# Patient Record
Sex: Female | Born: 1945 | Hispanic: No | Marital: Married | Smoking: Never smoker
Health system: Southern US, Community
[De-identification: ages and names within clinical notes are randomized; demographics above are authoritative.]

## PROBLEM LIST (undated history)

## (undated) DIAGNOSIS — J45909 Unspecified asthma, uncomplicated: Secondary | ICD-10-CM

## (undated) DIAGNOSIS — D849 Immunodeficiency, unspecified: Secondary | ICD-10-CM

## (undated) DIAGNOSIS — J189 Pneumonia, unspecified organism: Secondary | ICD-10-CM

## (undated) DIAGNOSIS — J479 Bronchiectasis, uncomplicated: Secondary | ICD-10-CM

## (undated) HISTORY — DX: Pneumonia, unspecified organism: J18.9

## (undated) HISTORY — DX: Unspecified asthma, uncomplicated: J45.909

## (undated) HISTORY — DX: Immunodeficiency, unspecified: D84.9

## (undated) HISTORY — DX: Bronchiectasis, uncomplicated: J47.9

## (undated) HISTORY — PX: TOTAL HIP ARTHROPLASTY: SHX124

---

## 1998-06-13 ENCOUNTER — Other Ambulatory Visit: Admission: RE | Admit: 1998-06-13 | Discharge: 1998-06-13 | Payer: Self-pay | Admitting: *Deleted

## 1999-12-20 ENCOUNTER — Other Ambulatory Visit: Admission: RE | Admit: 1999-12-20 | Discharge: 1999-12-20 | Payer: Self-pay | Admitting: Obstetrics and Gynecology

## 2000-04-21 ENCOUNTER — Other Ambulatory Visit: Admission: RE | Admit: 2000-04-21 | Discharge: 2000-04-21 | Payer: Self-pay | Admitting: Obstetrics and Gynecology

## 2011-12-12 ENCOUNTER — Other Ambulatory Visit: Payer: Self-pay | Admitting: Gastroenterology

## 2011-12-12 DIAGNOSIS — R059 Cough, unspecified: Secondary | ICD-10-CM

## 2011-12-12 DIAGNOSIS — R05 Cough: Secondary | ICD-10-CM

## 2011-12-31 ENCOUNTER — Ambulatory Visit
Admission: RE | Admit: 2011-12-31 | Discharge: 2011-12-31 | Disposition: A | Discharge status: Home / Self Care | Payer: Medicare Other | Source: Ambulatory Visit | Attending: Gastroenterology | Admitting: Gastroenterology

## 2011-12-31 DIAGNOSIS — R05 Cough: Secondary | ICD-10-CM

## 2011-12-31 DIAGNOSIS — R059 Cough, unspecified: Secondary | ICD-10-CM

## 2013-11-05 DIAGNOSIS — J309 Allergic rhinitis, unspecified: Secondary | ICD-10-CM | POA: Insufficient documentation

## 2013-11-05 DIAGNOSIS — K2100 Gastro-esophageal reflux disease with esophagitis, without bleeding: Secondary | ICD-10-CM | POA: Insufficient documentation

## 2013-11-05 DIAGNOSIS — J302 Other seasonal allergic rhinitis: Secondary | ICD-10-CM | POA: Insufficient documentation

## 2013-11-05 DIAGNOSIS — M5137 Other intervertebral disc degeneration, lumbosacral region: Secondary | ICD-10-CM | POA: Insufficient documentation

## 2013-11-05 DIAGNOSIS — M51379 Other intervertebral disc degeneration, lumbosacral region without mention of lumbar back pain or lower extremity pain: Secondary | ICD-10-CM | POA: Insufficient documentation

## 2014-01-24 DIAGNOSIS — S92514A Nondisplaced fracture of proximal phalanx of right lesser toe(s), initial encounter for closed fracture: Secondary | ICD-10-CM | POA: Insufficient documentation

## 2014-02-24 DIAGNOSIS — J45909 Unspecified asthma, uncomplicated: Secondary | ICD-10-CM | POA: Insufficient documentation

## 2014-02-28 DIAGNOSIS — M8580 Other specified disorders of bone density and structure, unspecified site: Secondary | ICD-10-CM | POA: Insufficient documentation

## 2014-02-28 DIAGNOSIS — M5416 Radiculopathy, lumbar region: Secondary | ICD-10-CM | POA: Insufficient documentation

## 2014-02-28 DIAGNOSIS — G43909 Migraine, unspecified, not intractable, without status migrainosus: Secondary | ICD-10-CM | POA: Insufficient documentation

## 2014-03-01 DIAGNOSIS — D803 Selective deficiency of immunoglobulin G [IgG] subclasses: Secondary | ICD-10-CM | POA: Insufficient documentation

## 2014-03-01 DIAGNOSIS — IMO0001 Reserved for inherently not codable concepts without codable children: Secondary | ICD-10-CM | POA: Insufficient documentation

## 2014-03-10 DIAGNOSIS — Z8709 Personal history of other diseases of the respiratory system: Secondary | ICD-10-CM | POA: Insufficient documentation

## 2014-03-10 DIAGNOSIS — J479 Bronchiectasis, uncomplicated: Secondary | ICD-10-CM | POA: Insufficient documentation

## 2014-03-15 DIAGNOSIS — J329 Chronic sinusitis, unspecified: Secondary | ICD-10-CM | POA: Insufficient documentation

## 2014-04-16 DIAGNOSIS — M159 Polyosteoarthritis, unspecified: Secondary | ICD-10-CM | POA: Insufficient documentation

## 2014-04-16 DIAGNOSIS — M1990 Unspecified osteoarthritis, unspecified site: Secondary | ICD-10-CM | POA: Insufficient documentation

## 2014-04-16 DIAGNOSIS — J181 Lobar pneumonia, unspecified organism: Secondary | ICD-10-CM

## 2014-04-16 DIAGNOSIS — J189 Pneumonia, unspecified organism: Secondary | ICD-10-CM | POA: Insufficient documentation

## 2014-08-15 DIAGNOSIS — F411 Generalized anxiety disorder: Secondary | ICD-10-CM | POA: Insufficient documentation

## 2014-08-15 DIAGNOSIS — I10 Essential (primary) hypertension: Secondary | ICD-10-CM | POA: Insufficient documentation

## 2014-11-21 DIAGNOSIS — R69 Illness, unspecified: Secondary | ICD-10-CM | POA: Insufficient documentation

## 2015-01-13 DIAGNOSIS — Z8371 Family history of colonic polyps: Secondary | ICD-10-CM | POA: Insufficient documentation

## 2015-01-19 DIAGNOSIS — D801 Nonfamilial hypogammaglobulinemia: Secondary | ICD-10-CM | POA: Insufficient documentation

## 2015-03-24 DIAGNOSIS — E785 Hyperlipidemia, unspecified: Secondary | ICD-10-CM | POA: Insufficient documentation

## 2015-05-23 DIAGNOSIS — F419 Anxiety disorder, unspecified: Secondary | ICD-10-CM | POA: Insufficient documentation

## 2015-06-21 DIAGNOSIS — J189 Pneumonia, unspecified organism: Secondary | ICD-10-CM | POA: Insufficient documentation

## 2015-06-30 DIAGNOSIS — R9389 Abnormal findings on diagnostic imaging of other specified body structures: Secondary | ICD-10-CM | POA: Insufficient documentation

## 2015-07-05 DIAGNOSIS — J4530 Mild persistent asthma, uncomplicated: Secondary | ICD-10-CM | POA: Insufficient documentation

## 2015-07-26 DIAGNOSIS — M2240 Chondromalacia patellae, unspecified knee: Secondary | ICD-10-CM | POA: Insufficient documentation

## 2015-07-26 DIAGNOSIS — R748 Abnormal levels of other serum enzymes: Secondary | ICD-10-CM | POA: Insufficient documentation

## 2015-07-26 DIAGNOSIS — M47817 Spondylosis without myelopathy or radiculopathy, lumbosacral region: Secondary | ICD-10-CM | POA: Insufficient documentation

## 2015-07-26 DIAGNOSIS — Z96649 Presence of unspecified artificial hip joint: Secondary | ICD-10-CM | POA: Insufficient documentation

## 2015-09-14 ENCOUNTER — Encounter: Payer: Self-pay | Admitting: Pediatrics

## 2015-09-14 ENCOUNTER — Ambulatory Visit (INDEPENDENT_AMBULATORY_CARE_PROVIDER_SITE_OTHER): Payer: Medicare Other | Admitting: Pediatrics

## 2015-09-14 VITALS — BP 136/76 | HR 94 | Temp 98.1°F | Resp 18 | Ht 61.0 in | Wt 131.4 lb

## 2015-09-14 DIAGNOSIS — K219 Gastro-esophageal reflux disease without esophagitis: Secondary | ICD-10-CM | POA: Diagnosis not present

## 2015-09-14 DIAGNOSIS — D849 Immunodeficiency, unspecified: Secondary | ICD-10-CM

## 2015-09-14 DIAGNOSIS — J3089 Other allergic rhinitis: Secondary | ICD-10-CM

## 2015-09-14 DIAGNOSIS — I10 Essential (primary) hypertension: Secondary | ICD-10-CM | POA: Diagnosis not present

## 2015-09-14 DIAGNOSIS — J4540 Moderate persistent asthma, uncomplicated: Secondary | ICD-10-CM | POA: Diagnosis not present

## 2015-09-14 LAB — PULMONARY FUNCTION TEST

## 2015-09-14 NOTE — Progress Notes (Signed)
[place] [address] Dept: [phone]  New Patient Note  Patient ID: Janet Garcia, female    DOB: [birth year]  Age: 70 y.o. MRN: [id number] Date of Office Visit: 09/14/2015 Referring provider: No referring provider defined for this encounter.    Chief Complaint: Wheezing; Cough; and Breathing Problem  HPI Janet Garcia presents for treatment of asthma and an immunoglobulin deficiency. She saw Dr. Francee Garcia at [facility] and she was placed on HYQVIA 25 g 500 mg. Medicare complete would not pay for this medication and they want her to be on Hyzentra. She had pneumonia twice in 2015 and then again in December 2016. She has bronchiectasis also. She received the pneumococcal conjugate 13 valent on 06/20/2014 and reportedly she did not have an antibody response. She has had nasal congestion for several years aggravated by exposure to dust and the springtime of the year. Sometimes chocolate makes her cough. She has gastroesophageal reflux. She has been using albuterol 0.083% every morning in addition to her other medications for asthma. Her immunoglobulin levels were low enough that immunoglobulin replacement is indicated in particular with a history of recurrent pneumonias  Review of Systems  Constitutional: Negative.   HENT:       Nasal for congestion for several years  Eyes: Negative.   Respiratory:       Pneumonia twice in 2015 and then again in December 2016  Cardiovascular: Negative.   Gastrointestinal: Negative.   Genitourinary: Negative.   Musculoskeletal: Negative.   Skin: Negative.   Neurological: Negative.   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     Outpatient Encounter Prescriptions as of 09/14/2015  Medication Sig  . albuterol (PROAIR HFA) 108 (90 Base) MCG/ACT inhaler   . albuterol (PROVENTIL) (2.5 MG/3ML) 0.083% nebulizer solution 2.5 mg.  .  alendronate (FOSAMAX) 70 MG tablet   . ALPRAZolam (XANAX) 0.25 MG tablet Take 0.25 mg by mouth.  [name] amLODipine (NORVASC) 5 MG tablet   . atorvastatin (LIPITOR) 20 MG tablet Take 20 mg by mouth.  [name] azithromycin (ZITHROMAX) 250 MG tablet Take 250 mg by mouth.  . Calcium Carbonate-Vitamin D (CALCIUM 600+D) 600-200 MG-UNIT TABS Take by mouth.  . Cholecalciferol (VITAMIN D3) 2000 units capsule Take by mouth.  . EPIPEN 2-PAK 0.3 MG/0.3ML SOAJ injection   . fexofenadine (ALLEGRA) 180 MG tablet Take 180 mg by mouth.  . fluticasone (FLONASE) 50 MCG/ACT nasal spray   . fluticasone furoate-vilanterol (BREO ELLIPTA) 200-25 MCG/INH AEPB Inhale into the lungs.  . Immune Globulin-Hyaluronidase (HYQVIA) 20 GM/200ML KIT Inject 25 g into the skin.  . montelukast (SINGULAIR) 10 MG tablet   . Multiple Vitamin (MULTIVITAMIN) tablet Take by mouth.  [name] omeprazole (PRILOSEC) 20 MG capsule   . predniSONE (DELTASONE) 20 MG tablet Take 20 mg by mouth. Reported on 09/14/2015  . traMADol (ULTRAM) 50 MG tablet Take 50 mg by mouth.  . [DISCONTINUED] amoxicillin-clavulanate (AUGMENTIN) 875-125 MG tablet TK 1 T PO  BID FOR 4 DAYS  . [DISCONTINUED] atorvastatin (LIPITOR) 20 MG tablet   . [DISCONTINUED] azithromycin (ZITHROMAX) 250 MG tablet   . [DISCONTINUED] cefdinir (OMNICEF) 300 MG capsule   . [DISCONTINUED] celecoxib (CELEBREX) 200 MG capsule   . [DISCONTINUED] ciprofloxacin (CILOXAN) 0.3 % ophthalmic solution   . [DISCONTINUED] clindamycin (CLEOCIN) 300 MG capsule   . [DISCONTINUED] doxycycline (VIBRA-TABS) 100 MG tablet TK 1 T PO BID FOR 7 DAYS  . [DISCONTINUED] EPINEPHrine (EPIPEN 2-PAK)  0.3 mg/0.3 mL IJ SOAJ injection Inject 0.3 mg into the muscle.  . [DISCONTINUED] esomeprazole (NEXIUM) 20 MG capsule Take 20 mg by mouth.  . [DISCONTINUED] fexofenadine (ALLEGRA) 60 MG tablet Take 60 mg by mouth.  . [DISCONTINUED] Fluticasone-Salmeterol (ADVAIR DISKUS) 500-50 MCG/DOSE AEPB   . [DISCONTINUED] ketorolac (ACULAR) 0.5 %  ophthalmic solution   . [DISCONTINUED] loratadine (CLARITIN) 10 MG tablet Take 10 mg by mouth.  . [DISCONTINUED] montelukast (SINGULAIR) 10 MG tablet   . [DISCONTINUED] moxifloxacin (AVELOX) 400 MG tablet   . [DISCONTINUED] moxifloxacin (AVELOX) 400 MG tablet    No facility-administered encounter medications on file as of 09/14/2015.     Drug Allergies:  Allergies  Allergen Reactions  . Guaifenesin Other (See Comments)    Other reaction(s): DIZZINESS  Mucinex D  . Guaifenesin Er     Other reaction(s): DIZZINESS  Mucinex D  . Levofloxacin Other (See Comments)    Double vision Double vision Double vision  . Codeine Nausea Only    Can tolerate if she eats prior to taking medication. Can tolerate if she eats prior to taking medication.  . Erythromycin Nausea Only    Patient reports she CAN TAKE Zithromax without problem Patient reports she CAN TAKE Zithromax without problem  . Penicillins Nausea Only  . Sulfa Antibiotics Nausea Only  . Sulfur Nausea Only    Family History: Janet Garcia's family history includes Hypertension in her mother. There is no history of Allergic rhinitis, Angioedema, Asthma, Eczema, Immunodeficiency, or Urticaria.. No family history of cystic fibrosis  Social and environmental-she has 2 cats in the home. She sells fabrics. She is not exposed to cigarette smoking. She has never been a smoker.  Physical Exam: BP 136/76 mmHg  Pulse 94  Temp(Src) 98.1 F (36.7 C) (Oral)  Resp 18  Ht 5' 1"  (1.549 m)  Wt 131 lb 6.4 oz (59.603 kg)  BMI 24.84 kg/m2   Physical Exam  Constitutional: She is oriented to person, place, and time. She appears well-developed and well-nourished.  HENT:  Eyes normal. Ears normal. Nose normal. Pharynx normal.  Neck: Neck supple.  Cardiovascular:  S1 and S2 normal no murmurs  Pulmonary/Chest:  Clear to percussion and auscultation  Abdominal: Soft. There is no tenderness (no hepatosplenomegaly).  Lymphadenopathy:    She has no  cervical adenopathy.  Neurological: She is alert and oriented to person, place, and time.  Psychiatric: She has a normal mood and affect. Her behavior is normal. Judgment and thought content normal.  Vitals reviewed.   Diagnostics: FVC 1.90 L FEV1 1.63 L. Predicted FVC 2.67 L predicted FEV1 2.02 L. After albuterol 2 puffs FVC 1.80 L and FEV1 1.54 L-this shows a mild reduction in the forced vital capacity with no significant improvement after albuterol  Allergy skin testing shows some reactivity to common indoor molds on intradermal testing only  Assessment Assessment and Plan: 1. Moderate persistent asthma, uncomplicated   2. Other allergic rhinitis   3. Gastroesophageal reflux disease without esophagitis   4. 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 Essential hypertension         Patient Instructions  Environmental control of dust and mold Allegra 180 mg once a day Fluticasone 2 sprays per nostril once a day if needed for stuffy nose Continue on your other current medications I will review your records from [facility] in particular your mmune evaluation You  will receive Hyzentra 10 grams subcutaneously every 2 weeks    Return in about 2  weeks (around 09/28/2015).   Thank you for the opportunity to care for this patient.  Please do not hesitate to contact me with questions.  Penne Lash, M.D.  [facility] [address] [place], [place] [address] [phone]

## 2015-09-14 NOTE — Patient Instructions (Addendum)
Environmental control of dust and mold Allegra 180 mg once a day Fluticasone 2 sprays per nostril once a day if needed for stuffy nose Continue on your other current medications I will review your records from [facility] in particular your mmune evaluation You  will receive Hyzentra 10 grams subcutaneously every 2 weeks

## 2015-09-15 DIAGNOSIS — D849 Immunodeficiency, unspecified: Secondary | ICD-10-CM | POA: Insufficient documentation

## 2015-09-15 DIAGNOSIS — K219 Gastro-esophageal reflux disease without esophagitis: Secondary | ICD-10-CM | POA: Insufficient documentation

## 2015-09-15 DIAGNOSIS — I10 Essential (primary) hypertension: Secondary | ICD-10-CM | POA: Insufficient documentation

## 2015-09-15 DIAGNOSIS — J4540 Moderate persistent asthma, uncomplicated: Secondary | ICD-10-CM | POA: Insufficient documentation

## 2015-10-11 DIAGNOSIS — H269 Unspecified cataract: Secondary | ICD-10-CM | POA: Insufficient documentation

## 2015-10-30 ENCOUNTER — Telehealth: Payer: Self-pay | Admitting: Pediatrics

## 2015-10-30 ENCOUNTER — Other Ambulatory Visit: Payer: Self-pay | Admitting: Allergy

## 2015-10-30 MED ORDER — ALBUTEROL SULFATE HFA 108 (90 BASE) MCG/ACT IN AERS: 2.0000 | INHALATION_SPRAY | RESPIRATORY_TRACT | Status: DC | PRN

## 2015-10-30 NOTE — Telephone Encounter (Signed)
Patient called and wanted albuterol hfa called in. Called in to [facility].

## 2015-10-30 NOTE — Telephone Encounter (Signed)
Requesting refill on Albuterol inhaler has been RX [employer] [facility] in the past.  She want it called out to [facility] at [address] in [place].

## 2015-11-06 MED ORDER — ALBUTEROL SULFATE HFA 108 (90 BASE) MCG/ACT IN AERS: 2.0000 | INHALATION_SPRAY | RESPIRATORY_TRACT | Status: DC | PRN

## 2015-11-06 NOTE — Telephone Encounter (Signed)
rx sent for proair hfa to pharmacy on file, last seen 08/2015.

## 2015-11-06 NOTE — Telephone Encounter (Signed)
This telephone encounter was still open in my basket from the 10th of April.  Just wanted to send it again to make sure it was gotten.  Thank you!!

## 2016-02-20 ENCOUNTER — Ambulatory Visit: Payer: Medicare Other | Admitting: Pediatrics

## 2016-02-23 ENCOUNTER — Encounter (INDEPENDENT_AMBULATORY_CARE_PROVIDER_SITE_OTHER): Payer: Self-pay

## 2016-02-23 ENCOUNTER — Encounter: Payer: Self-pay | Admitting: Allergy & Immunology

## 2016-02-23 ENCOUNTER — Ambulatory Visit (INDEPENDENT_AMBULATORY_CARE_PROVIDER_SITE_OTHER): Payer: Medicare Other | Admitting: Allergy & Immunology

## 2016-02-23 VITALS — BP 118/70 | HR 96 | Temp 98.4°F | Resp 16 | Ht 61.18 in | Wt 136.9 lb

## 2016-02-23 DIAGNOSIS — R635 Abnormal weight gain: Secondary | ICD-10-CM | POA: Diagnosis not present

## 2016-02-23 DIAGNOSIS — J310 Chronic rhinitis: Secondary | ICD-10-CM | POA: Diagnosis not present

## 2016-02-23 DIAGNOSIS — R918 Other nonspecific abnormal finding of lung field: Secondary | ICD-10-CM

## 2016-02-23 DIAGNOSIS — D649 Anemia, unspecified: Secondary | ICD-10-CM | POA: Diagnosis not present

## 2016-02-23 DIAGNOSIS — J3089 Other allergic rhinitis: Secondary | ICD-10-CM | POA: Diagnosis not present

## 2016-02-23 DIAGNOSIS — D839 Common variable immunodeficiency, unspecified: Secondary | ICD-10-CM | POA: Diagnosis not present

## 2016-02-23 DIAGNOSIS — M169 Osteoarthritis of hip, unspecified: Secondary | ICD-10-CM | POA: Diagnosis not present

## 2016-02-23 DIAGNOSIS — B999 Unspecified infectious disease: Secondary | ICD-10-CM

## 2016-02-23 DIAGNOSIS — J4550 Severe persistent asthma, uncomplicated: Secondary | ICD-10-CM

## 2016-02-23 DIAGNOSIS — J479 Bronchiectasis, uncomplicated: Secondary | ICD-10-CM | POA: Diagnosis not present

## 2016-02-23 DIAGNOSIS — K219 Gastro-esophageal reflux disease without esophagitis: Secondary | ICD-10-CM

## 2016-02-23 NOTE — Progress Notes (Signed)
FOLLOW UP  Date of Service/Encounter:  02/24/16   Assessment:   1.  Recurrent infections 2.  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  Persistent left lower lobe and lingular opacities 5.  Persistent scattered ground glass opacities 6.  Severe persistent asthma 7.  Allergic rhinitis (molds) 8.  GERD 9.  Anemia 10. Weight gain (15 pounds over the last five months) 11. Osteoarthritis s/p right hip replacement in 2015  Asthma Reportables:  Severity: : severe persistent  Risk: high due to history of multiple hospitalizations, steroid courses, and comorbitidies Control: not well controlled  Seasonal Influenza Vaccine: yes     Plan/Recommendations:    Janet Garcia is a 70 y.o. female with a complicated past medical history, which is made more complicated by the fact that she has obtained care at multiple facilities and providers. I am still unclear what her exact diagnosis is. Her workup has not been consecutive and she has gone for prolonged periods of times between vaccinations titers and vaccines with follow up titers. Although her IgG is certainly low, it is not at the levels we typically see with more clear CVID clinical pictures. Medications - including most notably steroids - are well known to cause hypogammaglobulinemia (IgG>>>IgA or IgM), and it seems that she has had several courses over the last few years. CVID also requires that IgA or IgM be low as well - her last IgM was technically low but once again typically it is absent entirely. Her picture could certainly fit with a specific antibody deficiency as well. The presence of the ground glass opacities - although it can be seen in acute infections and rheumatologic conditions such as sarcoidosis - can portend more serious diagnosis most notably  granulomatous lymphocytic interstitial lung disease which is a serious multisystemic inflammatory condition that can be seen in CVID and other monogenic mutations resulting in CVID-like phenotypes. Reassuringly, it does not seem that she has involvement of other organs typically seen in GLILD such as GI, skin, CNS, etc. In addition, she does not have leukopenia or thrombocytopenia which are also seen in the more serious subsets of CVID.   In any case, her immunoglobulin replacement is certainly warranted given her frequency of infections and complications. Since she is on immunoglobulin replacement, this will alter how we are able to look into her immunodeficiency, as our typical titers would not be useful in the setting of her receiving immunoglobulin replacement from multiple donors. Therefore, it might be helpful in the future to utilize a neoantigen such as Salmonella typhi to further characterize and assess her immune system.   Recommendations: - Labs: CBC with manual differential, CMP, immunoglobulin panel + IgE level, iron studies, thyroid studies, CVID flow cytometry (sent to [facility] [place]). I will fill out paperwork to help arrange this on Monday.  - The T cell lymphopenia was rather mild on the last flow cytometry and was at the level requiring PJP prophylaxis. Will continue to monitor.  - We will obtain imaging (specifically any chest CTs) from outside institutions since I am unable to view them from our system.  - We will obtain outside PFT records: specifically I am interested in seeing her full pulmonary function testing which was performed at [facility] according to the patient.   - We will obtain a repeat high  resolution chest CT to evaluate the ground glass opacities and the persistent LLL and lingular opacities. If there is persistence of this, we may consider obtaining a lung biopsy to further characterize her  disease process.  - We will continue with pulmonary toileting regimen, including albuterol nebulizer every morning followed immediately by use of the Acapella device. We will also continue the azithromycin daily for now. We could possible change to a MWF regimen which is commonly used in the cystic fibrosis population, but will discuss with Dr. Su Monks before making that decision.  - We will increase Hizentra today to 10gm every week for a total dose of around 650mg /kg/month due to her continued infections even while on the immunoglobulin replacement. Patients with bronchiectasis have a higher volume of distribution and metabolism of IgG, therefore they often require higher doses to compensate for this. I appreciate Bonita Quin and [name]'s help with making these changes. - We will consider further testing in the future, including Salmonella typhi with pre/post titers. - Return to clinic in one month.   Subjective:   Janet Garcia is a 70 y.o. female presenting today for follow up of  Chief Complaint  Patient presents with  . Asthma  .  Janet Garcia has a history of the following: Patient Active Problem List   Diagnosis Date Noted  . Recurrent infections 02/24/2016  . 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 Severe persistent asthma 02/24/2016  . Chronic rhinitis 02/24/2016  . Absolute anemia 02/24/2016  . Weight gain 02/24/2016  . Opacity of lung on imaging study 02/24/2016  . Moderate persistent asthma 09/15/2015  . Esophageal reflux 09/15/2015  . Immune deficiency disorder, disease or syndrome (HCC) 09/15/2015  . Essential hypertension 09/15/2015  . Abnormal liver enzymes 07/26/2015  . 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 Lumbosacral spondylosis 07/26/2015  . Asthma, mild persistent 07/05/2015  . Abnormal CAT scan 06/30/2015  . PNA (pneumonia) 06/21/2015  . Anxiety 05/23/2015  . HLD (hyperlipidemia) 03/24/2015  . 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 Family history of colonic polyps 01/13/2015  . Recurrent disease 11/21/2014  . Essential (primary) hypertension 08/15/2014  . Anxiety, generalized 08/15/2014  . Left lower lobe pneumonia 04/16/2014  . Arthritis, degenerative 04/16/2014  . Chronic infection of sinus 03/15/2014  . Bronchiectasis (HCC) 03/10/2014  . H/O respiratory system disease 03/10/2014  . Can't get food down 03/01/2014  . 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 Lumbar radiculopathy 02/28/2014  . Headache, migraine 02/28/2014  . Osteopenia 02/28/2014  . Airway hyperreactivity 02/24/2014  . Closed nondisplaced fracture of proximal phalanx of lesser toe of right foot 01/24/2014  . Allergic rhinitis 11/05/2013  . Degeneration of intervertebral disc of lumbosacral region 11/05/2013  . Esophagitis, reflux 11/05/2013    History obtained from: chart review, patient, EMR, and patient's daughter.  [name] was referred by [name], MD.     Katrese is a 70 y.o. female presenting for a follow up visit for hypogammaglobulinemia and asthma. She was last seen by Dr. Beaulah Dinning in February 2017. She has a history of recurrent pneumonias and specific antibody deficiency (did not response to PCV 13 in previous workups). She was maintained on Hizentra 10 grams every two weeks (approximately 322 mg/kg/month delivered into two sites on either her abdomen or her legs - she alternates each time - over approximately 90 minutes). She had testing done at the last appointment that showed mild reactivity to molds only on intradermals. Prior to this  last visit, she was not getting her immunoglobulin replacement at all due to insurance issues. This resulted in three hospitalizations for pneumonia during the fall of 2016 and the winter of 2016/2017. However, since coming to see [place], she has had no issues with coverage.   Providers: PCP: Dr. Synetta Fail Pulmonologist: Dr. Ralph Leyden Gastroenterologist: Carman Ching  Interim Infectious History:  After the last visit, she did receive her immunoglobulin replacement therapy every two weeks consistently since her last visit. Janet Garcia reports that she has done well since the last visit aside from a couple of respiratory infections. During these episodes, she develops a persistent cough that eventually becomes productive as well as a fever. She was treated first with a course of doxycycline for seven days. She felt better on this but then the cough returned shortly after stopping the antibiotic. She came back to her PCP's office where she was treated with a one week course of Augmentin, which seems to have cleared up her symptoms. Other than that, she has remained stable.  Bronchiectasis:  Janet Garcia was diagnosed with bronchiectasis within the last couple of years via a chest CT that was performed during one of her hospitalizations. Her last chest CT in December 2016 notes mild-to-moderate cylindrical bronchiectasis in the lingula and bilateral lower lobes. She was most recently followed by Dr. Durwin Reges at [facility], last visit March 20th, 2017. She first started seeing Dr. Su Monks during her last hospitalization for pneumonia in December 2016.   Prior to this, she was being followed by Dr. Trinidad Curet at [facility]. Review of his note dated 07/29/14 shows that she has had a hypersensitivity pneumonitis panel that was negative (tested to Aspergillus fumigatus, Miropolyspora faeni, T vulgaris, A pullulans, Thermoactinomyces, and pigeon). She has had multiple sputum cultures that grew only normal flora. She has also seen Dr. Lenard Galloway in Pulmonology as well. Currently, she is on O2 supplementation but only at night. She estimates that she needs around 1L at the most. This was started over two years ago.  Janet Garcia has had ground glass opacities present on chest CTs since at least 2015  according to a review of pulmonary notes. It does not appear that these have ever fully resolved. She has never had a lung biopsy (transbronchial or VATS). However, the only CT available for review in Care Everywhere is the most recent from December 2016 which notes the following:  IMPRESSION: 1. Mild-to-moderate cylindrical bronchiectasis in the lingula and bilateral lower lobes. 2. Patchy peribronchovascular and perilobular consolidation and ground-glass opacity in the lingula and bilateral lower lobes. In the acute inpatient setting, these findings are most suggestive of a multilobar infectious or inflammatory pneumonia. Given the basilar bronchiectasis and the recurrent/persistent basilar lung opacities on multiple prior chest imaging studies, recurrent aspiration pneumonia is suggested. 3. Fine ground-glass centrilobular nodularity throughout the upper lungs, decreased in  prominence compared to the 02/13/2014 chest CT. This indicates a nonspecific infectious or inflammatory bronchiolitis, and could also be secondary to aspiration. If the patient is a smoker, this could indicate smoking related interstitial lung disease (RB-ILD). No evidence of air trapping to suggest hypersensitivity pneumonitis. A follow-up outpatient high-resolution chest CT study in 6 months would be useful to assess for temporal change.  Asthma: Irisa is current using Breo one puff daily in conjunction with albuterol nebulizer which she uses at least once daily. She also uses Singulair daily as well as azithromycin daily for anti-inflammatory effects. She does have an  Acapella device (which she calls her "pickle") but she does not use it routinely. She is also on daily azithromycin which was started in 2015 when she was diagnosed with bronchiectasis. She has had full pulmonary function testing performed in the past - possibly at [facility] when she saw Dr. Lenard Galloway in Pulmonology - although unfortunately I cannot review  the results on Care Everywhere.   Allergic rhinitis (molds only): Currently Janet Garcia is on fluticasone two sprays per nostril daily as well as Allegra 180mg  daily. She reports that her symptoms are well controlled with this regimen. Occasionally she use will use nasal saline when her symptoms become more severe.   Recurrent Infections Diagnostic History:   Around 15 years ago, she was the relatively healthy member of her family. Around that time, however, she developed an upper respiratory infection and according to her daughter who is here today with her, she coughed for "two years straight". Her symptoms mostly consisted of respiratory infections (sinusitis and pneumonia). She was treated with antibiotics frequently to the point that she estimates that she was seeing her PCP every six weeks and receiving antibiotics. Fredia was diagnosed and treated for pneumonia multiple times in 2015, including May 2015, June 2015, and August 2015. These have all been diagnosed with CXR and treated via a combination of outpatient treatment and inpatient treatment. She also was hospitalized at least once in 2016 for pneumonia. There is no history of other infections, including viral infections, fungal infections, meningitis, cellulitis, bone or joint infections, or CNS infections. She has required hospitalizations for pneumonias, but otherwise has not been admitted for other infections.   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 However due to a combination of lack of follow-up and insurance issues, she has not been appropriately managed since that time from what I can tell from the chart review. She was last managed by Dr. Einar Pheasant at [facility] prior to establishing care with [facility]. Review of his note in May 2016 notes that she had non-protective titers to Tetanus and initial titers protective to only 4/12 Pneumococcus. It does not appear that she was  vaccinated after those labs. In May 2016 she was protective to only 7/23 serotypes. Her last PCV-23 was actually given in January 2013.   Vaccination hx: Tdap: 11/15/2008. Pneumococcal-23 08/08/2011; Prevnar 06/20/2014  Her last immunoglobulin panel was from May 2016 and noted the following:  IgG 502 (LOW) - unknown whether she was on consistent IgG replacement at this time IgA 274  IgM 56 (LOW)  The other previous immunoglobulin panel that was collected was from July 2015. At that time, IgG was mildly low at 508 with a normal IgA and normal IgM (327 and 82, respectively). IgE was undetectable. I do not believe she was on IgG replacement at the time of these immunoglobulins.   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 Unfortunately this was not approved therefore she went several months without immunoglobulin replacement. She did  receive one dose in November according to the patient. She likes Hyqvia due to the monthly administration. However there were issues with insurance approval for this. Therefore she went another from December 2016 through March 2017 without any immunoglobulin replacement.   In December 2016, she was admitted to the hospital for six days with pneumonia and treated with vancomycin and Zosyn. Sputum samples were negative, including Legionella and Strep pneumoniae. A chest CT at the time noted continued bronchiectasis as well as ground glass opacities throughout (fine  opacities in the upper lobes and coarse in the lower lobes) as well as consolidations in the bilateral lower lobes. There was concern for aspiration pneumonia, therefore her reflux medications were maximized. A follow up chest X-ray in March 2017 noted an improvement in the left lung base, although the area was still present. A review of the chest X-rays performed over the last year notes that there is a persistent left lower lobe and lingular opacity that never fully resolves.   GERD Symptom History: Janet Garcia does have a history of GERD which is treated with a daily PPI. She had a screening colonoscopy in September 2016 that was normal. Due to a history of GI cancer, she is scheduled to have screenings every five years instead of every 10 years. She reports that a "gastric polyp" was removed at the last one. Otherwise no abnormalities. She denies vomiting, constipation, or diarrhea. She has never had any rectal bleeding. She denies weight loss and in fact feels that she has gained around 15 pounds since her last visit.   Otherwise, there have been no changes to the past medical history, surgical history, family history, or social history since her last visit. Janet Garcia lives at home with two cats and one dog. She spends spare time knitting and sewing. Her daughter is currently visiting from [place] and will be here through the weekend.   Complete blood counts:    Complete metabolic panels:    Chest CT (December 2016):   FINDINGS: Mediastinum/Nodes: Normal heart size. No pericardial fluid/thickening. Great vessels are normal in course and caliber. Normal visualized thyroid. Normal esophagus. No pathologically enlarged axillary, mediastinal or gross hilar lymph nodes, noting limited sensitivity for the detection of hilar adenopathy on this noncontrast study.  Lungs/Pleura: No pneumothorax. No pleural effusion. There is patchy peribronchovascular and perilobular consolidation and ground-glass opacity  in the lingula and left lower lobe, with less prominent involvement of the right liver lobe. There is mild-to-moderate cylindrical bronchiectasis throughout both lower lobes and in the lingula. There is fine ground-glass centrilobular nodularity throughout both lungs, most prominent in the upper lobes and superior segments of the lower lobes, which was present on the 02/13/2014 chest CT and more florid at that time. The prominent ground-glass opacity and interlobular septal thickening seen in the upper lungs on the 02/13/2014 chest CT study has resolved. No lung masses. No significant regions of frank honeycombing. No significant air trapping on the expiration sequence.  Upper abdomen: Unremarkable.  Musculoskeletal: No aggressive appearing focal osseous lesions. Stable chronic moderate T11 and mild T12 vertebral compression fractures. Mild degenerative changes in the thoracic spine.   IMPRESSION: 1. Mild-to-moderate cylindrical bronchiectasis in the lingula and bilateral lower lobes. 2. Patchy peribronchovascular and perilobular consolidation and ground-glass opacity in the lingula and bilateral lower lobes. In the acute inpatient setting, these findings are most suggestive of a multilobar infectious or inflammatory pneumonia. Given the basilar bronchiectasis and the recurrent/persistent basilar lung opacities on multiple prior chest imaging studies,  recurrent aspiration pneumonia is suggested. 3. Fine ground-glass centrilobular nodularity throughout the upper lungs, decreased in prominence compared to the 02/13/2014 chest CT. This indicates a nonspecific infectious or inflammatory bronchiolitis, and could also be secondary to aspiration. If the patient is a smoker, this could indicate smoking related interstitial lung disease (RB-ILD). No evidence of air trapping to suggest hypersensitivity pneumonitis. A follow-up outpatient high-resolution chest CT study in 6 months would be useful to assess for  temporal change.    Review of Systems: a 14-point review of systems is pertinent for what is mentioned in HPI.  Otherwise, all other systems were negative. Constitutional: negative for fevers, positive for decreased energy, otherwise negative other than that listed in the HPI Eyes: positive for a left sided cataract s/p removal in May 2017, otherwise negative other than that listed in the HPI Ears, nose, mouth, throat, and face: negative for polyps, negative for anosmia, otherwise negative other than that listed in the HPI Respiratory: positive for baseline SOB, positive for daily albuterol use, otherwise  negative other than that listed in the HPI Cardiovascular: negative for syncope, negative for palpitations, otherwise negative other than that listed in the HPI Gastrointestinal: negative for vomiting and diarrhea, negative for bloody stools, otherwise negative other than that listed in the HPI Endo: positive for normal thyroid testing in 2015, otherwise negative other than that listed in the HPI Genitourinary: negative for UTIs, negative for hematuria or dysuria, otherwise negative other than that listed in the HPI Integument: negative for eczema, negative for rash, otherwise negative other than that listed in the HPI Hematologic: positive for anemia, negative for thrombocytopenia, negative for leukopenia, otherwise negative other than that listed in the HPI Musculoskeletal: negative for myalgias, negative for weakness, otherwise negative other than that listed in the HPI Neurological: negative other than that listed in the HPI Allergy/Immunologic: negative other than that listed in the HPI    Objective:   Blood pressure 118/70, pulse 96, temperature 98.4 F (36.9 C), temperature source Oral, resp. rate 16, height 5' 1.18" (1.554 m), weight 136 lb 14.5 oz (62.1 kg). Body mass index is 25.72 kg/m.   Physical Exam:  General: Alert, interactive, in no acute distress. Very kind female.  Occasionally tearful and overwhelmed. HEENT: TMs pearly gray, turbinates edematous with crusty discharge, post-pharynx mildly erythematous. Neck: Supple without lymphadenopathy. Lungs: Mildly decreased breath sounds bilaterally without wheezing. There are faint bibasilar crackles heard intermittently at the bases.  CV: Normal S1, S2 without murmurs. Capillary refill within normal limits.  Abdomen: Nondistended, nontender. No guarding or rebound tenderness noted. Liver approximately 2cm below the costal margin. No spleen tip palpated. Lymphatic: no adenopathy appreciated in the cervical, posterior auricular, axillary, epitrochlear, inguinal, or popliteal regions Skin:Warm and dry, without lesions or rashes. Extremities: No clubbing, cyanosis or edema. Neuro: Grossly intact. No focal deficits noted.     Diagnostic studies:  Spirometry: results normal (FEV1: 1.60/80%, FVC: 2.02/79%, FEV1/FVC: 79%).    Spirometry consistent with possible restrictive disease     [name], MD [facility]

## 2016-02-23 NOTE — Patient Instructions (Signed)
1. Increase to 10gm Hizentra every week.   2. We will order a repeat chest CT.  3. Use the pickle after every albuterol each morning.  4. We would like to get some labs but we will call you when we decide what to get.   5. Return to clinic in one month.  It was a pleasure to meet you today!

## 2016-02-24 DIAGNOSIS — D638 Anemia in other chronic diseases classified elsewhere: Secondary | ICD-10-CM | POA: Insufficient documentation

## 2016-02-24 DIAGNOSIS — J310 Chronic rhinitis: Secondary | ICD-10-CM | POA: Insufficient documentation

## 2016-02-24 DIAGNOSIS — R635 Abnormal weight gain: Secondary | ICD-10-CM | POA: Insufficient documentation

## 2016-02-24 DIAGNOSIS — B999 Unspecified infectious disease: Secondary | ICD-10-CM | POA: Insufficient documentation

## 2016-02-24 DIAGNOSIS — D839 Common variable immunodeficiency, unspecified: Secondary | ICD-10-CM | POA: Insufficient documentation

## 2016-02-24 DIAGNOSIS — J4550 Severe persistent asthma, uncomplicated: Secondary | ICD-10-CM | POA: Insufficient documentation

## 2016-02-24 DIAGNOSIS — R918 Other nonspecific abnormal finding of lung field: Secondary | ICD-10-CM | POA: Insufficient documentation

## 2016-02-24 MED ORDER — FLUTICASONE FUROATE-VILANTEROL 200-25 MCG/INH IN AEPB: 1.0000 | INHALATION_SPRAY | Freq: Every day | RESPIRATORY_TRACT | 5 refills | Status: DC

## 2016-02-26 ENCOUNTER — Telehealth: Payer: Self-pay | Admitting: Allergy & Immunology

## 2016-02-26 NOTE — Telephone Encounter (Signed)
I called Janet Garcia to let her know that we ordered lab work at [facility]. We also arranged for the chest CT, which Janet Garcia has already scheduled. No new concerns at this time.  Malachi BondsJoel Kole Hilyard, MD FAAAAI [facility]

## 2016-02-26 NOTE — Addendum Note (Signed)
Addended by: Maryjean MornFREEMAN, LOGAN D on: 02/26/2016 12:50 PM   Modules accepted: Orders

## 2016-02-27 ENCOUNTER — Telehealth: Payer: Self-pay | Admitting: *Deleted

## 2016-02-27 ENCOUNTER — Telehealth: Payer: Self-pay | Admitting: Allergy & Immunology

## 2016-02-27 DIAGNOSIS — B999 Unspecified infectious disease: Secondary | ICD-10-CM

## 2016-02-27 NOTE — Telephone Encounter (Signed)
I did not realize that this was going to be such a hassle - sorry! [facility] should be able to figure out how to send it. I'm sure they send samples all of the time, right? Let me know what I can do to help. They do need a CBC with diff on the same day that the CVID panel is drawn. I had one ordered so as long as that gets drawn on the same day that the CVID panel is sent, that should be sufficient. We can order another one if the rest of her labs have already been drawn. Feel free to call my cell with questions if needed - [phone]. Thanks!

## 2016-02-27 NOTE — Telephone Encounter (Signed)
Spoke with Janet Garcia today, she stated that [facility] did not draw blood today and that they wanted to wait until they had the shipping information straightened out. Informed her that we would call her once it was taken care of so she can go back to get that drawn. She did however have her chest CT done yesterday.

## 2016-02-27 NOTE — Telephone Encounter (Signed)
The pt had gone [facility] today to have the CVID test drawn and I received a call [facility] saying that they needed the shipping labels sent to them before they could send tubes out to [facility]. I then called the [facility] [phone], and they told me it was [facility] responsibility to send shipping labels. Angelica ChessmanMandy [phone], at [facility] also informed me that with the CVID, a CBC w/ Diff is required to be drawn with it, just FYI. Could not get in touch with Angelica ChessmanMandy this afternoon to inform her that the outside lab also said they could not print shipping labels, will try to call her again tomorrow.

## 2016-03-01 ENCOUNTER — Telehealth: Payer: Self-pay | Admitting: Allergy & Immunology

## 2016-03-01 DIAGNOSIS — B999 Unspecified infectious disease: Secondary | ICD-10-CM

## 2016-03-01 NOTE — Telephone Encounter (Signed)
I called Ms. Bixler to let her know about the chest CT results. It was notable for improved ground glass opacities compared to the December CT. There continued to be bronchiectasis in the BL bases as well as reticulonodular densities in the bases around the bronchiectasis. However the upper lungs bilaterally appeared more normal, at least from hte radiology read. I would like to get actual CD copies so that I can take a look personally. I will also touch base with Dr. Su MonksEjaz next week to see if there is anything else we need to do to maximize her lung function.   We are still working on getting the labs ordered. This has been a constant stress all week. I have [facility] without success. I will try again next week.   I left a message on the patient's voicemail with the above information.   Malachi BondsJoel Hannahgrace Lalli, MD FAAAAI [facility] [place]

## 2016-03-08 NOTE — Telephone Encounter (Signed)
Ordered labs at [facility] including IgG, IgA, IgM, IgE, cbc w/ diff, T-Cell test #[id number], B-Cell test [phone]#[id number] (this test is only done Monday through Thursday), Fe, TIBC, Ferritin, Transferrin, CMP. I will fax over the labs ordered as well and will call pt and inform her to go to [facility] to have these drawn. She states she will go to [facility] on Tuesday.

## 2016-03-08 NOTE — Addendum Note (Signed)
Addended by: Maryjean MornFREEMAN, Jachob Mcclean D on: 03/08/2016 02:43 PM   Modules accepted: Orders

## 2016-03-15 LAB — CBC WITH DIFFERENTIAL/PLATELET
Basophils Absolute: 0 10*3/uL (ref 0.0–0.2)
Basos: 0 %
EOS (ABSOLUTE): 0 10*3/uL (ref 0.0–0.4)
Eos: 0 %
Hematocrit: 37 % (ref 34.0–46.6)
Hemoglobin: 12.5 g/dL (ref 11.1–15.9)
Immature Grans (Abs): 0 10*3/uL (ref 0.0–0.1)
Immature Granulocytes: 0 %
Lymphocytes Absolute: 1.6 10*3/uL (ref 0.7–3.1)
Lymphs: 23 %
MCH: 31.9 pg (ref 26.6–33.0)
MCHC: 33.8 g/dL (ref 31.5–35.7)
MCV: 94 fL (ref 79–97)
Monocytes Absolute: 0.5 10*3/uL (ref 0.1–0.9)
Monocytes: 7 %
Neutrophils Absolute: 4.9 10*3/uL (ref 1.4–7.0)
Neutrophils: 70 %
Platelets: 273 10*3/uL (ref 150–379)
RBC: 3.92 x10E6/uL (ref 3.77–5.28)
RDW: 13.9 % (ref 12.3–15.4)
WBC: 7 10*3/uL (ref 3.4–10.8)

## 2016-03-15 LAB — CD19 AND CD20, FLOW CYTOMETRY
% CD19: 18 %
% CD20: 18 %

## 2016-03-15 LAB — COMPREHENSIVE METABOLIC PANEL
ALT: 24 IU/L (ref 0–32)
AST: 29 IU/L (ref 0–40)
Albumin/Globulin Ratio: 1.6 (ref 1.2–2.2)
Albumin: 4.2 g/dL (ref 3.6–4.8)
Alkaline Phosphatase: 94 IU/L (ref 39–117)
BUN/Creatinine Ratio: 17 (ref 12–28)
BUN: 14 mg/dL (ref 8–27)
Bilirubin Total: <0.2 mg/dL (ref 0.0–1.2)
CO2: 26 mmol/L (ref 18–29)
Calcium: 9.3 mg/dL (ref 8.7–10.3)
Chloride: 102 mmol/L (ref 96–106)
Creatinine, Ser: 0.83 mg/dL (ref 0.57–1.00)
GFR calc Af Amer: 83 mL/min/{1.73_m2} (ref >59)
GFR calc non Af Amer: 72 mL/min/{1.73_m2} (ref >59)
Globulin, Total: 2.7 g/dL (ref 1.5–4.5)
Glucose: 82 mg/dL (ref 65–99)
Potassium: 3.8 mmol/L (ref 3.5–5.2)
Sodium: 144 mmol/L (ref 134–144)
Total Protein: 6.9 g/dL (ref 6.0–8.5)

## 2016-03-15 LAB — TRANSFERRIN: Transferrin: 355 mg/dL (ref 200–370)

## 2016-03-15 LAB — IGG, IGA, IGM
IgA/Immunoglobulin A, Serum: 305 mg/dL (ref 87–352)
IgG (Immunoglobin G), Serum: 821 mg/dL (ref 700–1600)
IgM (Immunoglobulin M), Srm: 54 mg/dL (ref 26–217)

## 2016-03-15 LAB — IGE: IgE (Immunoglobulin E), Serum: 1 IU/mL (ref 0–100)

## 2016-03-15 LAB — FE+TIBC+FER
Ferritin: 32 ng/mL (ref 15–150)
Iron Saturation: 23 % (ref 15–55)
Iron: 106 ug/dL (ref 27–139)
Total Iron Binding Capacity: 454 ug/dL — ABNORMAL HIGH (ref 250–450)
UIBC: 348 ug/dL (ref 118–369)

## 2016-03-18 ENCOUNTER — Telehealth: Payer: Self-pay | Admitting: Allergy & Immunology

## 2016-03-18 NOTE — Telephone Encounter (Signed)
I called Janet Garcia to discuss her lab results which were largely unremarkable. CBC and CMP are unremarkable. Unfortunately, the limited flow cytometry we were able to get was not completely revealing. Iron studies were notable for a slightly elevated TIBC, although the serum iron level was normal. Immunoglobulin levels were normal - including IgA and IgM - ruling out CVID as a diagnosis.  Patient reports that she is doing fairly well since the last visit. She has started the increased dosing of Hizentra and feels better on it. She has had no breakthrough infections.   I did try to touch base with Dr. Su MonksEjaz in Pulmonology today. I did miss a call from him, but unfortunately I was unable to call him back because the number was to the main office rather than a direct line to him. However I will try to reach him again. Specifically I am interested in his comparison of the chest CTs. On paper, she seems to have improved. However I do not have access to the actual CTs so I am unable to compare them myself.   Malachi BondsJoel Jahmere Bramel, MD FAAAAI [facility] [place]

## 2016-03-19 NOTE — Telephone Encounter (Signed)
I was finally able to touch base with Dr. Su MonksEjaz from Pulmonology. He reviewed her CT and felt that bronchiectasis was fairly mild compared to what he is used to. Although the read mentioned interstitial lung disease, he did not feel that the lung disease was very prominent at all. He is happy to see her again if she prefers. Following her last visit with him, she was seeing a pulmonologist (Dr. Hendricks Mil[place]rios[place]. However, she prefers to see Dr. Su MonksEjaz instead.   Regarding the azithromycin, he said he would prefer to either stop it or keep it daily. With the the MWF regimen, he tends to see more antibiotic resistance. Therefore we could consider doing one month on, one month off, etc. If we decide to keep it daily, it would be a good idea to obtain an EKG since azithromycin can result in QT prolongation.  Malachi BondsJoel Kamorah Nevils, MD FAAAAI [facility] [place]

## 2016-03-20 NOTE — Telephone Encounter (Signed)
Pt has an appointment scheduled with you on September 15th.

## 2016-03-28 ENCOUNTER — Ambulatory Visit: Payer: Medicare Other | Admitting: Pediatrics

## 2016-04-05 ENCOUNTER — Ambulatory Visit (INDEPENDENT_AMBULATORY_CARE_PROVIDER_SITE_OTHER): Payer: Medicare Other | Admitting: Allergy & Immunology

## 2016-04-05 ENCOUNTER — Encounter: Payer: Self-pay | Admitting: Allergy & Immunology

## 2016-04-05 VITALS — BP 134/72 | HR 104 | Temp 98.2°F | Resp 20

## 2016-04-05 DIAGNOSIS — R918 Other nonspecific abnormal finding of lung field: Secondary | ICD-10-CM | POA: Diagnosis not present

## 2016-04-05 DIAGNOSIS — J479 Bronchiectasis, uncomplicated: Secondary | ICD-10-CM | POA: Diagnosis not present

## 2016-04-05 DIAGNOSIS — K219 Gastro-esophageal reflux disease without esophagitis: Secondary | ICD-10-CM | POA: Diagnosis not present

## 2016-04-05 DIAGNOSIS — J4550 Severe persistent asthma, uncomplicated: Secondary | ICD-10-CM | POA: Diagnosis not present

## 2016-04-05 DIAGNOSIS — B999 Unspecified infectious disease: Secondary | ICD-10-CM | POA: Diagnosis not present

## 2016-04-05 DIAGNOSIS — J310 Chronic rhinitis: Secondary | ICD-10-CM | POA: Diagnosis not present

## 2016-04-05 DIAGNOSIS — D649 Anemia, unspecified: Secondary | ICD-10-CM | POA: Diagnosis not present

## 2016-04-05 NOTE — Patient Instructions (Addendum)
1. Recurrent infections - Continue with current dosing of Hizentra.  - We will change to a dose every two weeks.  - Call us with any onset of infection symptoms.  - We get repeat labs at the next visit.  2. 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 - Follow up with Dr. Su MonksEjaz as scheduled.  3. Severe persistent asthma, uncomplicated - Continue with Breo one puff once daily. - Continue with albuterol as needed.  4. Chronic rhinitis - Continue with Flonase one spray per nostril daily. - Consider using nasal saline rinses prior to using the Flonase.   5. Gastroesophageal reflux disease - Continue with omeprazole at the current dosing.  6. Anemia - Looked better with the last set of labs. - We will continue to monitor.   7. Opacity of lung on imaging study - Repeat chest CT looked much improved.  - We will defer to Dr. Su MonksEjaz regarding the frequency of future imaging studies.   7. Return in about 6 months (around 10/03/2016).  Please inform us of any Emergency Department visits, hospitalizations, or changes in symptoms. Call us before going to the ED for breathing or allergy symptoms since we might be able to fit you in for a sick visit. Feel free to contact us anytime with any questions, problems, or concerns.  It was a pleasure to see you again today!

## 2016-04-05 NOTE — Progress Notes (Signed)
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 Recurrent infections - Continue with current dosing of Hizentra.  - We will change to a dose every two weeks.  - Call us with any onset of infection symptoms.  - We get repeat labs at the next visit.   2. 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 - Could consider changing to a vest in the future.  - Follow up with Dr. Su Monks as scheduled.  3. Severe persistent asthma, uncomplicated - Continue with Janet Garcia one puff once daily. - Continue with albuterol as needed. - Could consider adding a LAMA or ICS/LAMA if no improvement at the next visit.  4. Chronic rhinitis - Continue with Flonase one spray per nostril daily. - Consider using nasal saline rinses prior to using the Flonase.  - There would likely be little improvement with immunotherapy since she is only allergic to a few molds.   5. Gastroesophageal reflux disease - Continue with omeprazole at the current dosing. - I did encouraged Janet Garcia to make a follow up appointment with Dr. Fayrene Fearing.  6. Anemia - Looked better with the last set of labs.  - No GI bleed present, per the last GI scoping. - We will continue to monitor.   7. Opacity of lung on imaging study - Repeat chest CT looked much improved.  - We will defer to Dr. Su Monks regarding the frequency of future imaging studies.   7. Return in about 6 months (around  10/03/2016).     Subjective:   Janet Garcia is a 70 y.o. female presenting today for follow up of  Chief Complaint  Patient presents with  . Asthma  .  Janet Garcia has a history of the following: Patient Active Problem List   Diagnosis Date Noted  . Recurrent infections 02/24/2016  . 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 Severe persistent asthma 02/24/2016  . Chronic rhinitis 02/24/2016  . Absolute anemia 02/24/2016  . Weight gain 02/24/2016  . Opacity of lung on imaging study 02/24/2016  . Moderate persistent asthma 09/15/2015  . Esophageal reflux 09/15/2015  . Immune deficiency disorder, disease or syndrome (HCC) 09/15/2015  . Essential hypertension 09/15/2015  . Abnormal liver enzymes 07/26/2015  . 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 Lumbosacral spondylosis 07/26/2015  . Asthma, mild persistent 07/05/2015  . Abnormal CAT scan 06/30/2015  . PNA (pneumonia) 06/21/2015  . Anxiety 05/23/2015  . HLD (hyperlipidemia) 03/24/2015  . 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 Family history of colonic polyps 01/13/2015  . Recurrent disease 11/21/2014  . Essential (primary) hypertension 08/15/2014  . Anxiety, generalized 08/15/2014  . Left lower lobe pneumonia 04/16/2014  . Arthritis, degenerative 04/16/2014  . Chronic infection of sinus 03/15/2014  . Bronchiectasis (HCC) 03/10/2014  . H/O respiratory system disease 03/10/2014  . Can't get food down 03/01/2014  . 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 Lumbar radiculopathy 02/28/2014  . Headache, migraine 02/28/2014  . Osteopenia 02/28/2014  . Airway hyperreactivity 02/24/2014  .  Closed nondisplaced fracture of proximal phalanx of lesser toe of right foot 01/24/2014  . Allergic rhinitis 11/05/2013  . Degeneration of intervertebral disc of lumbosacral region 11/05/2013  . Esophagitis, reflux 11/05/2013    History obtained from:  chart review and patient.  Janet Garcia was referred by Janet Garcia,Janet B., MD.     Providers: PCP: Dr. Synetta Failaniel Garcia Pulmonologist: Dr. Ralph LeydenMuhammad Garcia Gastroenterologist: Janet Garcia  Janet Garcia is a 70 y.o. 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 Her full clinical history is laid out in the note from 02/23/16. She was last seen on 02/23/16, at which time we made several changes including optimizing her Hizentra dosing (previously on 322mg /kg/month and increased to 650mg /kg/month). We also obtained multiple labs, including a CBC with differential, CMP, immunoglobulin panel, iron studies, thyroid studies, and flow cytometry. I preferred to get the CVID panel from [facility], but he paperwork proved to be too much for [facility] or [facility] to handle. Overall her labs were normal. She did have a slightly elevated TIBC which can be indicative of iron deficiency anemia, however her iron level was normal. Of particular importance, her immunoglobulin panel was notable for a normal IgG (secondary to her replacement therapy) and normal IgA and IgM. The latter two labs rule out CVID by definition.   Interim Infectious History:  Since the last visit, Janet Garcia has actually done pretty well. She has had no infections on her higher dosing of immunoglobulin replacement. However she is not happy with the cost. Prior to the dose increase, she was paying around $400 per month. However now she is paying around $850 per month. This is "crimping [her] lifestyle" but due to the cost as well as the time required for the infusions each week. She is actually interested in changing to [facility]via but the last time that this was tried, her insurance was planning to charge her $1000 per month. She is interested in taking it to every two weeks, if not decreasing the dose somewhat. It should be noted that she has had no  infections since being on the higher dose therapy, however.  Bronchiectasis with Severe Persistent Asthma: Janet JonesCarolyn is not using her Acapella on a daily basis as recommended at the last visit. She tells me today that her breathing is really only a problem during the summer months. She remains on her Janet BouquetBreo which is pleased with this medication. Over the summer she was using the Acapella daily but once her "bad part of the year" came to end, she has decreased this. She does continue to have a chronic cough. Her pulse ox has been higher recently - usually around 97-97%. During the summer months, it routinely dipped down into the mid 80s, which is why she was requiring O2 at night. She is not using O2 at night currently. Since the summer winded down, she has not needed her rescue inhaler at all.   Ground Glass Opacities with Concern for Interstitial Lung Disease: Janet JonesCarolyn did have a repeat chest CT after the last visit. The upper lobes had nearly cleared completely. She continued to have bronchiectasis with reticulonodular densities in the bilateral bases. I have been trying to get CDs of the actual chest CTs but have been unsuccessful with this. I did discuss her case with Dr. Su MonksEjaz around one month ago after the chest CT and he was able to look at the actual scans. He felt that her bronchiectasis is  fairly mild and he did not feel that the lung disease was prominent at all. He was willing to see her again. Previously, he felt that she might need bronchial thermoplasty and referred her to [facility]. However, [facility] did not feel that this was warranted at this time. In any case. Dr. Su Monks wanted to keep her on the daily azithromycin regimen. Of note, Janet Garcia has had a normal EKG in the recent past (PCP monitors this routinely).   Allergic Rhinitis (positive to molds only): Janet Garcia remains on fluticasone two sprays per nostril daily as well as Allegra 180mg  daily . She reports that her symptoms continue to  be well controlled with this regimen. She does use an air purifier in her home 24 hours per day. She has considered immunotherapy, but since she was only positive for a few molds, it is unclear how helpful this would be.   GERD:  Janet Garcia has a history of reflux that is treated with omeprazole 20mg  once daily. She is supposed to be in it twice daily due to concern for chronic aspiration, however she does not want to take it twice daily due to the price as well as "all of the health problems [PPIs] cause". She does see Dr. Carman Ching at [facility]. Her last colonoscopy and endoscopy were in September 2016. The only finding was a gastric polyp which was removed. Otherwise there was no evidence of bleeding or other GI abnormalities. She does not have a follow up appointment with him at this time. She does control her reflux sleeping at an angle (has a sleep number bed).   Otherwise, there have been no changes to the past medical history, surgical history, family history, or social history. She continues to live at home with her husband, two cats, and one dog. She does travel quite a bit and spends time knitting and sewing.   RECENT LABS:  Immunoglobulin panel (03/12/16): IgG 821, IgA 305, IgM 54, IgE 1 CMP (03/12/16): normal including a normal protein level of 6.9 CBC with diff (03/12/16): normal including hemoglobin  Iron studies (03/12/16):    CT CHEST WITHOUT CONTRAST (02/26/16)  TECHNIQUE: Multidetector CT imaging of the chest was performed following the standard protocol without IV contrast.  COMPARISON:06/25/2015.  FINDINGS: Cardiovascular: Atherosclerotic calcification of the arterial vasculature, including coronary arteries. Heart size normal. No pericardial effusion.  Mediastinum/Nodes: No pathologically enlarged mediastinal or axillary lymph nodes. Esophagus is grossly unremarkable.  Lungs/Pleura: There may be very mild basilar subpleural reticulation and bronchiectasis,  right greater than left. Calcified granuloma on the right. Mucoid impaction in the lingula. No pleural fluid. Airway is unremarkable.  Upper Abdomen: Visualized portions of the liver, gallbladder, adrenal glands, kidneys, spleen, pancreas, stomach and bowel are grossly unremarkable. No upper abdominal adenopathy.  Musculoskeletal: No worrisome lytic or sclerotic lesions. Degenerative changes are seen in the spine. Lower thoracic compression deformities and Schmorl's nodes are unchanged.  IMPRESSION: 1. Mild subpleural reticular densities and bronchiectasis at the lung bases, right greater than left, possibly postinflammatory in etiology. Difficult to exclude nonspecific interstitial pneumonitis. 2. Aortic atherosclerosis and coronary artery calcification.  Review of Systems: a 14-point review of systems is pertinent for what is mentioned in HPI.  Otherwise, all other systems were negative. Constitutional: negative other than that listed in the HPI Eyes: negative other than that listed in the HPI Ears, nose, mouth, throat, and face: negative other than that listed in the HPI Respiratory: negative other than that listed in the HPI Cardiovascular:  negative other than that listed in the HPI Gastrointestinal: negative other than that listed in the HPI Genitourinary: negative other than that listed in the HPI Integument: negative other than that listed in the HPI Hematologic: negative other than that listed in the HPI Musculoskeletal: negative other than that listed in the HPI Neurological: negative other than that listed in the HPI Allergy/Immunologic: negative other than that listed in the HPI    Objective:   Blood pressure 134/72, pulse (!) 104, temperature 98.2 F (36.8 C), temperature source Oral, resp. rate 20, SpO2 97 %. There is no height or weight on file to calculate BMI.   Physical Exam:  General: Alert, interactive, in no acute distress. Very pleasant female. Whereas last  time she seemed somewhat overwhelmed, today she seems to be more collected.  HEENT: TMs pearly gray bilaterally, turbinates edematous with clear discharge, post-pharynx mildly erythematous. Neck: Supple without lymphadenopathy. No thyromegaly.  Lungs: Mildly decreased breath sounds bilaterally without wheezing Faint bibasilar crackles heard intermittently at the bases.  CV: Normal S1, S2 without murmurs. Capillary refill within normal limits. Pulses 2+ on the bilateral upper extremities.  Abdomen: Nondistended, nontender. No guarding or rebound tenderness noted. Liver approximately 2cm below the costal margin. No spleen tip palpated whatsoever. Lymphatic: no adenopathy appreciated in the cervical, posterior auricular, axillary, epitrochlear, inguinal, or popliteal regions Skin:Warm and dry, without lesions or rashes. Extremities: No clubbing, cyanosis or edema. Neuro: Grossly intact. No focal deficits noted. Appropriate during the visit and responsive to commands.   Diagnostic studies:  Spirometry: results normal (FEV1: 1.76/100%, FVC: 2.04/81%, FEV1/FVC: 86%).    Spirometry consistent with normal pattern     Janet Bonds, MD [facility] [place]

## 2016-04-09 ENCOUNTER — Telehealth: Payer: Self-pay | Admitting: *Deleted

## 2016-04-09 NOTE — Telephone Encounter (Signed)
Janet BonierJaci Hood from [facility] requesting a medical records release of orders and clinical documentation because they are starting services for her Hizentra infusions. Fax is [phone].  Call back number is [phone]. I will fax records today.

## 2016-04-09 NOTE — Progress Notes (Signed)
Dr Reece AgarG she is suppose to be on 10g weekly was previously on 20g biweekly before you changed it couple months ago.  No real change in volume just frequency.

## 2016-04-12 NOTE — Progress Notes (Signed)
Hey [name],  I don't know why the price would have jumped for her then - how odd. I talked to her today and we compromised. I do not feel comfortable taking her down to her 10 grams every two weeks with the cold and flu season coming up. So we are doing to do 15 grams every two weeks instead (~485mg /kg/month). I saw the paperwork on my [place] desk to sign. But since we are changing again, I will wait for the new dose to be posted.   Thanks!   Malachi BondsJoel Fallon Haecker, MD FAAAAI [facility] [place]

## 2016-04-16 ENCOUNTER — Ambulatory Visit: Payer: Medicare Other

## 2016-05-03 ENCOUNTER — Telehealth: Payer: Self-pay

## 2016-05-03 NOTE — Telephone Encounter (Signed)
Spoke to BalfourAndrew from [facility] New orders to increase the hizentra to 15 gm to every other week per Dr Dellis AnesGallagher. Greig Castillandrew stated he will send out 1 month supply. Will have [name] check into the immune Globulin-Hyaluronidase(Hyqvia) to see if her insurance co. Will cover this med. But in the mean time will continue with the hizentra.

## 2016-05-06 NOTE — Telephone Encounter (Signed)
Unfortunately the expense will be the same I have discussed with patient regarding options such as insurance change coming up to go to MCR/supplement instead of advantage plan and asking for patient assistance through pharmacy

## 2016-06-03 ENCOUNTER — Telehealth: Payer: Self-pay

## 2016-06-03 NOTE — Telephone Encounter (Signed)
Called [facility] [phone] regarding a refill. Patient is due for the Hizentra tomorrow.   Spoke with patient.  She is doing great on the increase of Hizentra 15 gm subQ every 2 weeks.  Please refill.  [facility] today for you to sign and send back to [facility].  Let me know If I can help get this refill done.  Patient knows you are out of the clinic today and will be back tomorrow to sign orders. Lyriq Finerty

## 2016-06-03 NOTE — Telephone Encounter (Signed)
Hello, Amy! Thank you for taking care of that! I will be in [place] tomorrow, so you might have to resend it. Otherwise I can sign it on Wednesday.   Thanks!  Malachi BondsJoel Ida Milbrath, MD FAAAAI [facility] [place] 

## 2016-06-04 NOTE — Telephone Encounter (Signed)
Hey there - I signed the orders and I believe [facility]. Thanks again!   Malachi BondsJoel Lennix Kneisel, MD FAAAAI [facility] [place]

## 2016-06-04 NOTE — Telephone Encounter (Signed)
Hey Dr. Dellis AnesGallagher, I spoke with DEE and [facility].  She will get you to sign order and [facility] to [facility] and then scan order for patients echart. They should overnight her meds. Thanks!!!! Demarquis Osley

## 2016-08-06 DIAGNOSIS — S32030A Wedge compression fracture of third lumbar vertebra, initial encounter for closed fracture: Secondary | ICD-10-CM | POA: Insufficient documentation

## 2016-08-16 ENCOUNTER — Encounter: Payer: Self-pay | Admitting: Allergy & Immunology

## 2016-08-16 ENCOUNTER — Ambulatory Visit (INDEPENDENT_AMBULATORY_CARE_PROVIDER_SITE_OTHER): Payer: Medicare Other | Admitting: Allergy & Immunology

## 2016-08-16 VITALS — BP 126/76 | HR 100 | Temp 98.3°F | Resp 20

## 2016-08-16 DIAGNOSIS — D839 Common variable immunodeficiency, unspecified: Secondary | ICD-10-CM | POA: Diagnosis not present

## 2016-08-16 DIAGNOSIS — J3089 Other allergic rhinitis: Secondary | ICD-10-CM | POA: Diagnosis not present

## 2016-08-16 DIAGNOSIS — B999 Unspecified infectious disease: Secondary | ICD-10-CM | POA: Diagnosis not present

## 2016-08-16 DIAGNOSIS — J479 Bronchiectasis, uncomplicated: Secondary | ICD-10-CM | POA: Diagnosis not present

## 2016-08-16 DIAGNOSIS — J4540 Moderate persistent asthma, uncomplicated: Secondary | ICD-10-CM | POA: Diagnosis not present

## 2016-08-16 DIAGNOSIS — K219 Gastro-esophageal reflux disease without esophagitis: Secondary | ICD-10-CM

## 2016-08-16 LAB — PULMONARY FUNCTION TEST

## 2016-08-16 MED ORDER — AMOXICILLIN-POT CLAVULANATE 875-125 MG PO TABS: 1.0000 | ORAL_TABLET | Freq: Two times a day (BID) | ORAL | 1 refills | Status: AC

## 2016-08-16 NOTE — Progress Notes (Signed)
FOLLOW UP  Date of Service/Encounter:  08/16/16   Assessment:   Recurrent infections  Bronchiectasis without complication (HCC)  Severe persistent asthma, uncomplicated  Chronic rhinitis  Gastroesophageal reflux disease  Anemia  Opacity of lung on imaging study   Asthma Reportables:  Severity: severe persistent  Risk: high due to multiple comorbidities Control: well controlled  Seasonal Influenza Vaccine: no but encouraged     Plan/Recommendations:   1. Common variable immunodeficiency - Continue with current dosing of Hizentra.  - I will talk to [facility] once you get me their contact information.  - We will get repeat labs at the next visit.   2. Bronchiectasis without complication (HCC) - Use the Acapella every day to help clear out your lungs. - Follow up with Dr. Su MonksEjaz as scheduled.    3. Severe persistent asthma, uncomplicated - Continue with Breo one puff once daily. - Continue with albuterol as needed.   4. Chronic rhinitis - Continue with Flonase one spray per nostril daily. - Consider using nasal saline rinses prior to using the Flonase.   5. Gastroesophageal reflux disease - Continue with omeprazole at the current dosing.  6. Anemia of chronic disease - Looked better with the last set of labs. - We will continue to monitor.   7. Opacity of lung on imaging study - Dr. Su MonksEjaz is following with appointment in March 2018.  8. Return in about 3 months (around 11/14/2016).    Subjective:   Janet Garcia is a 71 y.o. female presenting today for follow up of  Chief Complaint  Patient presents with  . Asthma    Janet LungerCarolyn Garcia has a history of the following: Patient Active Problem List   Diagnosis Date Noted  . Recurrent infections 02/24/2016  . 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 Severe persistent asthma 02/24/2016  . Chronic rhinitis 02/24/2016  . Absolute anemia 02/24/2016  . Weight gain 02/24/2016  .  Opacity of lung on imaging study 02/24/2016  . Moderate persistent asthma 09/15/2015  . Esophageal reflux 09/15/2015  . Immune deficiency disorder, disease or syndrome (HCC) 09/15/2015  . Essential hypertension 09/15/2015  . Abnormal liver enzymes 07/26/2015  . 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 Lumbosacral spondylosis 07/26/2015  . Asthma, mild persistent 07/05/2015  . Abnormal CAT scan 06/30/2015  . PNA (pneumonia) 06/21/2015  . Anxiety 05/23/2015  . HLD (hyperlipidemia) 03/24/2015  . 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 Family history of colonic polyps 01/13/2015  . Recurrent disease 11/21/2014  . Essential (primary) hypertension 08/15/2014  . Anxiety, generalized 08/15/2014  . Left lower lobe pneumonia (HCC) 04/16/2014  . Arthritis, degenerative 04/16/2014  . Chronic infection of sinus 03/15/2014  . Bronchiectasis (HCC) 03/10/2014  . H/O respiratory system disease 03/10/2014  . Can't get food down 03/01/2014  . 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 Lumbar radiculopathy 02/28/2014  . Headache, migraine 02/28/2014  . Osteopenia 02/28/2014  . Airway hyperreactivity 02/24/2014  . Closed nondisplaced fracture of proximal phalanx of lesser toe of right foot 01/24/2014  . Allergic rhinitis 11/05/2013  . Degeneration of intervertebral disc of lumbosacral region 11/05/2013  . Esophagitis, reflux 11/05/2013   Providers: PCP: Dr. Synetta Failaniel Jobe Pulmonologist: Dr. Ralph LeydenMuhammad Ejaz Gastroenterologist: Carman ChingJames Edwards  History obtained from: chart review and patient.  Janet Lungerarolyn Wolke was referred by Malka SoJOBE,DANIEL B., MD.     Ms. Janet Garcia is a 71 y.o. female with a very complicated past medical history including recurrent infections and hypogammaglobulinemia  presenting for a follow up visit. Her full clinical history is laid out in the note from 02/23/16. She was last seen on 02/23/16, at which time we made several  changes including optimizing her Hizentra dosing (previously on 322mg /kg/month and increased to 650mg /kg/month). We also obtained. Overall her labs were normal. She did have a slightly elevated TIBC which can be indicative of iron deficiency anemia, however her iron level was normal. Her IgG was low at diagnosis, as was her IgM (IgG 502 and IgM 56).[name] Her IgM has been normal at other times, however. She is here today in because she recently changed Medicare programs, which necessitated her to have an earlier appointment.   Interim Infectious History:  Since the last visit, [name] has actually done pretty well. She has had no infections on her higher dosing of immunoglobulin replacement. She is on 15gm Hizentra every two weeks, which has markedly decreased her frequency of infections. Because she changed her Medicare Advantage plan, however, they have had issues with refilling the Hizentra. She has not had Hizentra for the last month, but thankfully has not had any infections. She has recently   Bronchiectasis with Severe Persistent Asthma: Menucha is not using her Acapella on a daily basis as recommended, but she is doing it more than previously. Merna's asthma has been well controlled. She has not required rescue medication, experienced nocturnal awakenings due to lower respiratory symptoms, nor have activities of daily living been limited. She does continue to have a chronic cough. She does have a history of hypoxia that is most prominent in the summer months, routinely dipping down into the mid 80s, which is why she was requiring O2 at night. She is not using O2 at night currently. She has not needed her rescue inhaler at all since the last visit. She feels that the [place] working quite well.  Ground Glass Opacities with Concern for Interstitial Lung Disease: [name]'s last chest CT showed clearance of the upper lobes. She continued to have bronchiectasis with reticulonodular densities in the bilateral  bases. She continues to follow with Dr. Su Monks every 6 months. He does not feel that the chest CTs are too terrible. Previously, he felt that she might need bronchial thermoplasty and referred her to [facility] However, [facility] did not feel that this was warranted at this time. In any case. Dr. Su Monks has continued her on the daily azithromycin regimen. Of note, Ms. Gildersleeve has had a normal EKG in the recent past (PCP monitors this routinely).   Allergic Rhinitis (positive to molds only): Jewel remains on fluticasone two sprays per nostril daily as well as Allegra 180mg  daily. Her symptoms continue to be well controlled with this regimen. She does use an air purifier in her home 24 hours per day. She has never been on immunotherapy.   GERD:  Vika has a history of reflux that is treated with omeprazole 20mg  once daily. She does see Dr. Carman Ching at [facility]. Her last colonoscopy and endoscopy were in September 2016. The only finding was a gastric polyp which was removed. Otherwise there was no evidence of bleeding or other GI abnormalities. She does not have a follow up appointment with him at this time. She does sleep at an angle (has a sleep number bed).   Otherwise, there have been no changes to the past medical history, surgical history, family history, or social history. She continues to live at home with her husband, two cats, and one dog. She does travel  quite a bit and spends time knitting and sewing. 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 Her daughter recently completed her Masters in Nursing and lives in [place]. Her daughter is planning to become a midwife as well. Her daughter also works part time as a Financial controller.    Review of Systems: a 14-point review of systems is pertinent for what is mentioned in HPI.  Otherwise, all other systems were negative. Constitutional: negative other than that listed in the HPI Eyes: negative  other than that listed in the HPI Ears, nose, mouth, throat, and face: negative other than that listed in the HPI Respiratory: negative other than that listed in the HPI Cardiovascular: negative other than that listed in the HPI Gastrointestinal: negative other than that listed in the HPI Genitourinary: negative other than that listed in the HPI Integument: negative other than that listed in the HPI Hematologic: negative other than that listed in the HPI Musculoskeletal: negative other than that listed in the HPI Neurological: negative other than that listed in the HPI Allergy/Immunologic: negative other than that listed in the HPI    Objective:   Blood pressure 126/76, pulse 100, temperature 98.3 F (36.8 C), temperature source Oral, resp. rate 20. There is no height or weight on file to calculate BMI.   Physical Exam:  General:Alert, interactive, in no acute distress.Very pleasant female. Talkative as always. Baseline nasal voice.  HEENT: TMs pearly gray bilaterally, turbinates edematouswith clear discharge, post-pharynx erythematous without cobblestoning. Neck:Supple without lymphadenopathy. No thyromegaly.  Lungs:Mildly decreased breath sounds bilaterally without wheezing Faint bibasilar crackles heard intermittently at the bases.  GN:FAOZHY S1, S2 without murmurs.Capillary refill within normal limits. Pulses 2+ on the bilateral upper extremities.  Abdomen: Nondistended, nontender. No guarding or rebound tenderness noted. Liver approximately 2cm below the costal margin. No spleen tip palpated whatsoever. Lymphatic:no adenopathy appreciated in the cervical, posterior auricular, axillary, epitrochlear, inguinal, or popliteal regions Skin:Warm and dry, without lesions or rashes. Extremities:No clubbing, cyanosis or edema. Neuro: Grossly intact.No focal deficits noted. Appropriate during the visit and responsive to commands.     Diagnostic studies:   Spirometry:  Normal FEV1, FVC, and FEV1/FVC ratio. There is mild scooping suggestive of obstructive disease.    Allergy Studies:  None    [name], MD [facility] [place]

## 2016-08-16 NOTE — Patient Instructions (Addendum)
1. Common variable immunodeficiency - Continue with current dosing of Hizentra.  - I will talk to [facility] once you get me their contact information.  - We will get repeat labs at the next visit.   2. 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 - Follow up with Dr. Su MonksEjaz as scheduled.    3. Severe persistent asthma, uncomplicated - Continue with Breo one puff once daily. - Continue with albuterol as needed.   4. Chronic rhinitis - Continue with Flonase one spray per nostril daily. - Consider using nasal saline rinses prior to using the Flonase.   5. Gastroesophageal reflux disease - Continue with omeprazole at the current dosing.  6. Anemia - Looked better with the last set of labs. - We will continue to monitor.   7. Opacity of lung on imaging study - Dr. Su MonksEjaz is following with appointment in March 2018.  8. Return in about 3 months (around 11/14/2016).  Please inform us of any Emergency Department visits, hospitalizations, or changes in symptoms. Call us before going to the ED for breathing or allergy symptoms since we might be able to fit you in for a sick visit. Feel free to contact us anytime with any questions, problems, or concerns.  It was a pleasure to see you again today!

## 2016-08-19 ENCOUNTER — Telehealth: Payer: Self-pay | Admitting: Allergy & Immunology

## 2016-08-19 ENCOUNTER — Encounter: Payer: Self-pay | Admitting: Allergy & Immunology

## 2016-08-19 NOTE — Telephone Encounter (Signed)
[facility]. She reports that they received my note from our clinic visit and the Hizentra has been approved. She should be getting her Hizentra by the end of the week. Emailed patient to let her know.   Malachi BondsJoel Tyauna Lacaze, MD [facility] [place]

## 2016-09-04 ENCOUNTER — Telehealth: Payer: Self-pay | Admitting: Allergy & Immunology

## 2016-09-04 MED ORDER — PREDNISONE 20 MG PO TABS: 60.0000 mg | ORAL_TABLET | Freq: Every day | ORAL | 0 refills | Status: AC

## 2016-09-04 NOTE — Telephone Encounter (Signed)
Received an email from [name] reporting that she has had nasal congestion and wheezing for three days now. She did have an antibiotic from our last visit that she had no started, so she did start it at this time. She is using her nebulizer every 4-6 hours. I will send in some prednisone as well (60mg  daily for five days) to help her to heal. She did restart the Hizentra infusions last week.  Janet BondsJoel Markesia Crilly, MD [facility] [place]

## 2016-10-01 ENCOUNTER — Telehealth: Payer: Self-pay | Admitting: *Deleted

## 2016-10-01 NOTE — Telephone Encounter (Signed)
Pt wants to know why insurance didn't pay anything on her bill from January. Would like a return call.

## 2016-10-04 ENCOUNTER — Ambulatory Visit: Payer: Medicare Other | Admitting: Allergy & Immunology

## 2016-10-14 ENCOUNTER — Other Ambulatory Visit: Payer: Self-pay | Admitting: Gastroenterology

## 2016-10-14 DIAGNOSIS — R1011 Right upper quadrant pain: Secondary | ICD-10-CM

## 2016-10-22 ENCOUNTER — Other Ambulatory Visit: Payer: Medicare Other

## 2016-10-25 ENCOUNTER — Ambulatory Visit
Admission: RE | Admit: 2016-10-25 | Discharge: 2016-10-25 | Disposition: A | Discharge status: Home / Self Care | Payer: Medicare Other | Source: Ambulatory Visit | Attending: Gastroenterology | Admitting: Gastroenterology

## 2016-10-25 DIAGNOSIS — R1011 Right upper quadrant pain: Secondary | ICD-10-CM

## 2016-11-15 ENCOUNTER — Telehealth: Payer: Self-pay | Admitting: *Deleted

## 2016-11-15 ENCOUNTER — Ambulatory Visit (INDEPENDENT_AMBULATORY_CARE_PROVIDER_SITE_OTHER): Payer: Medicare Other | Admitting: Allergy & Immunology

## 2016-11-15 VITALS — BP 128/80 | HR 100 | Temp 98.1°F | Resp 16 | Ht 60.0 in | Wt 139.0 lb

## 2016-11-15 DIAGNOSIS — D839 Common variable immunodeficiency, unspecified: Secondary | ICD-10-CM | POA: Diagnosis not present

## 2016-11-15 DIAGNOSIS — J479 Bronchiectasis, uncomplicated: Secondary | ICD-10-CM | POA: Diagnosis not present

## 2016-11-15 DIAGNOSIS — J4550 Severe persistent asthma, uncomplicated: Secondary | ICD-10-CM | POA: Diagnosis not present

## 2016-11-15 DIAGNOSIS — J3089 Other allergic rhinitis: Secondary | ICD-10-CM | POA: Diagnosis not present

## 2016-11-15 DIAGNOSIS — R918 Other nonspecific abnormal finding of lung field: Secondary | ICD-10-CM

## 2016-11-15 DIAGNOSIS — K219 Gastro-esophageal reflux disease without esophagitis: Secondary | ICD-10-CM

## 2016-11-15 DIAGNOSIS — D638 Anemia in other chronic diseases classified elsewhere: Secondary | ICD-10-CM | POA: Diagnosis not present

## 2016-11-15 NOTE — Progress Notes (Signed)
FOLLOW UP  Date of Service/Encounter:  11/17/16   Assessment:   Common variable immunodeficiency  Chronic allergic rhinitis (molds)  Severe persistent asthma, uncomplicated  Gastroesophageal reflux disease  Opacity of lung on imaging study  Bronchiectasis without complication  Anemia of chronic disease    Asthma Reportables: Severity: severe persistent Risk: high due to multiple comorbidities Control: well controlled  Seasonal Influenza Vaccine:no but encouraged    Plan/Recommendations:   1. Common variable immunodeficiency - Continue with current dosing of Hizentra.  - We will get some repeat labs today: CBC, CMP, IgG level, iron studies - If Ms. Mcconico has another serious infection this year, we will increase her immunoglobulin dosing.   2. Bronchiectasis without complication (HCC) - Continue with the Acapella daily  to help clear out your lungs. - Follow up with Dr. Su Monks as scheduled.   - We will send our notes to Dr. Su Monks.  3. Severe persistent asthma, uncomplicated - Lung function looked stable today.  - Continue with Breo one puff once daily. - Continue with albuterol as needed.   4. Chronic rhinitis - Continue with Flonase one spray per nostril daily. - Consider using nasal saline rinses prior to using the Flonase.   5. Gastroesophageal reflux disease - Continue with omeprazole at the current dosing.  6. Anemia - Looked better with the last set of labs. - We will continue to monitor.   7. Opacity of lung on imaging study - Dr. Su Monks is following.  8. Return in about 3 months (around 02/14/2017).   Subjective:   Janet Garcia is a 71 y.o. female presenting today for follow up of  Chief Complaint  Patient presents with  . Asthma    Janet Garcia has a history of the following: Patient Active Problem List   Diagnosis Date Noted  . Recurrent infections 02/24/2016  . 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 Severe  persistent asthma 02/24/2016  . Chronic rhinitis 02/24/2016  . Absolute anemia 02/24/2016  . Weight gain 02/24/2016  . Opacity of lung on imaging study 02/24/2016  . Moderate persistent asthma 09/15/2015  . Esophageal reflux 09/15/2015  . Immune deficiency disorder, disease or syndrome (HCC) 09/15/2015  . Essential hypertension 09/15/2015  . Abnormal liver enzymes 07/26/2015  . 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 Lumbosacral spondylosis 07/26/2015  . Asthma, mild persistent 07/05/2015  . Abnormal CAT scan 06/30/2015  . PNA (pneumonia) 06/21/2015  . Anxiety 05/23/2015  . HLD (hyperlipidemia) 03/24/2015  . 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 Family history of colonic polyps 01/13/2015  . Recurrent disease 11/21/2014  . Essential (primary) hypertension 08/15/2014  . Anxiety, generalized 08/15/2014  . Left lower lobe pneumonia (HCC) 04/16/2014  . Arthritis, degenerative 04/16/2014  . Chronic infection of sinus 03/15/2014  . Bronchiectasis (HCC) 03/10/2014  . H/O respiratory system disease 03/10/2014  . Can't get food down 03/01/2014  . 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 Lumbar radiculopathy 02/28/2014  . Headache, migraine 02/28/2014  . Osteopenia 02/28/2014  . Airway hyperreactivity 02/24/2014  . Closed nondisplaced fracture of proximal phalanx of lesser toe of right foot 01/24/2014  . Allergic rhinitis 11/05/2013  . Degeneration of intervertebral disc of lumbosacral region 11/05/2013  . Esophagitis, reflux 11/05/2013    History obtained from: chart review and the patient.  Janet Garcia was referred by Janet So., MD.     Moncerrat is a 71 y.o. female with a very complicated past medical history including recurrent  infections and hypogammaglobulinemia presenting for a follow up visit. Her full clinical history is laid out in the note from 02/23/16. At the visit on 02/23/16, we made several  changes including optimizing her Hizentra dosing (previously on /kg/month and increased to /kg/month). Overall her labs were normal. She did have a slightly elevated TIBC which can be indicative of iron deficiency anemia, however her iron level was normal. Her IgG was low at diagnosis, as was her IgM (IgG 502 and IgM 56).[name] Her IgM has been normal at other times, however. She was last seen on 08/16/16 at which time she was having problems with coverage of her  Hizentra because she had changed her Medicare plan. Unfortunately at that time, she had already gone one month without Hizentra infusions, but thankfully she had not had breakthrough infections. We were able to get everything figured out, and shortly after our visit she restarted her infusions.   Interim Infectious History:  Since the last visit, Janet Garcia has had a rather rough tim. She was diagnosed with influenza at an Urgent Care visit in February. Just prior to this in the middle of February, she has started the antibiotic that I had prescribed at the last visit as a precaution since she had missed so many weeks of her Hizentra infusions. She was also endorsing wheezing and coughing, therefore I sent in a course of prednisone as well. Following this, she was diagnosed with influenza and then developing what sounds like bronchitis versus a sinus infection. In any case, she was treated with cefdinir for two rounds in total, prescribed by Dr. Su Monks. She seems to have done well with this, as she reports that she did not contract pneumonia and did not end up in the hospital, which was an impressive feat for her.   Bronchiectasis with Severe Persistent Asthma: Carolynis not using her Acapella on a daily basis as recommended, but she is trying to use it more frequently. Janet Garcia's asthma has been well controlled. She has not required rescue medication, experienced nocturnal awakenings due to lower respiratory symptoms, nor have activities of daily  living been limited. She does continue to have a chronic cough. She does have a history of hypoxia that is most prominent in the summer months, routinely dipping down into the mid 80s, which is why she was requiring O2 at night. She is not using O2 at night currently. She has not needed her rescue inhaler at all since the last visit. She feels that the [place] working quite well.  Ground Glass Opacities with Concern for Interstitial Lung Disease: Raisha's last chest CT showed clearance of the upper lobes. She has bronchiectasis with reticulonodular densities in the bilateral bases. She continues to follow with Dr. Su Monks every 6 months, and her next appointment is in about one week. Dr. Su Monks has continued her on the daily azithromycin regimen. Of note, Ms. Phippshas had a normal EKG in the recent past (PCP monitors this routinely).   Allergic Rhinitis (positive to molds only): Carolynremains on fluticasone two sprays per nostril daily as well as Allegra  daily. Her symptoms continue to be well controlled with this regimen. She does use an air purifier in her home 24 hours per day. She has never been on immunotherapy.   GERD with a history of gastric polyps: Carolynhas a history of reflux that is treated with omeprazole  once daily. She does see Dr. Carman Ching at [facility]. Her last colonoscopy and endoscopy were in September 2016. The only finding was  a gastric polyp which was removed. Otherwise there was no evidence of bleeding or other GI abnormalities.   Ms. Brownfield is concerned with fatigue today. 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 She has had thyroid studies checked in the recent past and they were normal. She continues to maintain her job but her decreased energy for things outside of work that she used to enjoy. She thinks that this may be her new normal.   Otherwise, there have been no changes to the past medical history, surgical history,  family history, or social history. She continues to live at home with her husband, two cats, and one dog. She does travel quite a bit and spends time knitting and sewing. 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 Her daughter recently completed her Masters in Nursing and lives in [place]. Her daughter is planning to become a midwife as well. Her daughter also works part time as a Financial controller.   Otherwise, there have been no changes to her past medical history, surgical history, family history, or social history.    Review of Systems: a 14-point review of systems is pertinent for what is mentioned in HPI.  Otherwise, all other systems were negative. Constitutional: negative other than that listed in the HPI Eyes: negative other than that listed in the HPI Ears, nose, mouth, throat, and face: negative other than that listed in the HPI Respiratory: negative other than that listed in the HPI Cardiovascular: negative other than that listed in the HPI Gastrointestinal: negative other than that listed in the HPI Genitourinary: negative other than that listed in the HPI Integument: negative other than that listed in the HPI Hematologic: negative other than that listed in the HPI Musculoskeletal: negative other than that listed in the HPI Neurological: negative other than that listed in the HPI Allergy/Immunologic: negative other than that listed in the HPI    Objective:   Blood pressure 128/80, pulse 100, temperature 98.1 F (36.7 C), temperature source Oral, resp. rate 16, height 5' (1.524 m), weight 139 lb (63 kg), SpO2 95 %. Body mass index is 27.15 kg/m.   Physical Exam:  General:Alert, interactive, in no acute distress.Very pleasant female. Talkative which is her normal state. Baseline nasal voice.  HEENT: TMs pearly gray bilaterally, turbinates edematouswith clear discharge, post-pharynx erythematous without cobblestoning. Neck:Supple  without lymphadenopathy. No thyromegaly.  Lungs:Mildly decreased breath sounds bilaterally without wheezing Faint bibasilar crackles heard intermittently at the bases. No increased work of breathing.  AO:ZHYQMV S1, S2 without murmurs.Capillary refill within normal limits. Pulses 2+ on the bilateral upper extremities.  Abdomen: Nondistended, nontender, no masses. No guarding or rebound tenderness noted. Liver approximately 2cm below the costal margin. No spleen tip palpated whatsoever. Lymphatic:no adenopathy appreciated in the cervical, posterior auricular, axillary, epitrochlear, inguinal, or popliteal regions Skin:Warm and dry, without lesions or rashes. Extremities:No clubbing, cyanosis or edema. Neuro: Grossly intact.No focal deficits noted. Appropriate during the visit and responsive to commands.     Diagnostic studies:  Spirometry: results normal (FEV1: 1.66/88%, FVC: 2.05/82%, FEV1/FVC: 81%).    Spirometry consistent with normal pattern. Compared to the spiral obtained at the last visit, her values have remained stable. Her flow volume loop appears normal without evidence scooping suggestive of obstructive disease.  Allergy Studies: none   [name], MD [facility] [place]

## 2016-11-15 NOTE — Telephone Encounter (Signed)
Pt called in march asking about her bill and never received a return call. Would like to know why her insurance isn't paying anything and if we're filing the right insurance. I double checked what we have in the system and was told that is correct. I added the medicard a and b as secondary.

## 2016-11-15 NOTE — Telephone Encounter (Signed)
Janet Garcia refiled pt's claim correctly

## 2016-11-15 NOTE — Patient Instructions (Addendum)
1. Common variable immunodeficiency - Continue with current dosing of Hizentra.  - We will get some repeat labs today: CBC, CMP, IgG level, iron studies  2. 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 - Follow up with Dr. Su Monks as scheduled.   - We will send our notes to Dr. Su Monks.  3. Severe persistent asthma, uncomplicated - Lung function looked stable today.  - Continue with Breo one puff once daily. - Continue with albuterol as needed.   4. Chronic rhinitis - Continue with Flonase one spray per nostril daily. - Consider using nasal saline rinses prior to using the Flonase.   5. Gastroesophageal reflux disease - Continue with omeprazole at the current dosing.  6. Anemia - Looked better with the last set of labs. - We will continue to monitor.   7. Opacity of lung on imaging study - Dr. Su Monks is following.  8. Return in about 3 months (around 02/14/2017).  Please inform us of any Emergency Department visits, hospitalizations, or changes in symptoms. Call us before going to the ED for breathing or allergy symptoms since we might be able to fit you in for a sick visit. Feel free to contact us anytime with any questions, problems, or concerns.  It was a pleasure to see you again today!

## 2016-11-18 LAB — CBC WITH DIFFERENTIAL/PLATELET
Basophils Absolute: 64 cells/uL (ref 0–200)
Basophils Relative: 1 %
Eosinophils Absolute: 64 cells/uL (ref 15–500)
Eosinophils Relative: 1 %
HCT: 37.6 % (ref 35.0–45.0)
Hemoglobin: 12.4 g/dL (ref 11.7–15.5)
Lymphocytes Relative: 19 %
Lymphs Abs: 1216 cells/uL (ref 850–3900)
MCH: 32.3 pg (ref 27.0–33.0)
MCHC: 33 g/dL (ref 32.0–36.0)
MCV: 97.9 fL (ref 80.0–100.0)
MPV: 10 fL (ref 7.5–12.5)
Monocytes Absolute: 512 cells/uL (ref 200–950)
Monocytes Relative: 8 %
Neutro Abs: 4544 cells/uL (ref 1500–7800)
Neutrophils Relative %: 71 %
Platelets: 321 10*3/uL (ref 140–400)
RBC: 3.84 MIL/uL (ref 3.80–5.10)
RDW: 15.4 % — ABNORMAL HIGH (ref 11.0–15.0)
WBC: 6.4 10*3/uL (ref 3.8–10.8)

## 2016-11-19 LAB — CMP 10231
AG Ratio: 1.3 Ratio (ref 1.0–2.5)
ALT: 25 U/L (ref 6–29)
AST: 24 U/L (ref 10–35)
Albumin: 4 g/dL (ref 3.6–5.1)
Alkaline Phosphatase: 99 U/L (ref 33–130)
BUN/Creatinine Ratio: 20.5 Ratio (ref 6–22)
BUN: 17 mg/dL (ref 7–25)
CO2: 24 mmol/L (ref 20–31)
Calcium: 9.5 mg/dL (ref 8.6–10.4)
Chloride: 105 mmol/L (ref 98–110)
Creat: 0.83 mg/dL (ref 0.60–0.93)
GFR, Est African American: 83 mL/min (ref >=60)
GFR, Est Non African American: 72 mL/min (ref >=60)
Globulin: 3 g/dL (ref 1.9–3.7)
Glucose, Bld: 104 mg/dL — ABNORMAL HIGH (ref 65–99)
Potassium: 3.8 mmol/L (ref 3.5–5.3)
Sodium: 140 mmol/L (ref 135–146)
Total Bilirubin: 0.3 mg/dL (ref 0.2–1.2)
Total Protein: 7 g/dL (ref 6.1–8.1)

## 2016-11-19 LAB — IGG: IgG (Immunoglobin G), Serum: 949 mg/dL (ref 694–1618)

## 2016-11-19 LAB — IRON,TIBC AND FERRITIN PANEL
%SAT: 17 % (ref 11–50)
Ferritin: 30 ng/mL (ref 20–288)
Iron: 73 ug/dL (ref 45–160)
TIBC: 422 ug/dL (ref 250–450)

## 2017-02-09 ENCOUNTER — Telehealth: Payer: Self-pay | Admitting: Allergy & Immunology

## 2017-02-09 NOTE — Telephone Encounter (Signed)
I contacted [name] to check on whether she was interested in changing to [place]. I talked to the rep this week and she felt that the Medicare coverage was much improved since the last time that Janet Garcia and I talked about her immunoglobulin replacement. Janet Garcia was quite interested in changing to [facility] due to her work schedule. Therefore, she would like [name] to run the numbers to see if [name] is covered with her current Medicare Advantage plan.  [name], MD FAAAAI [facility] [place] [place]

## 2017-02-10 NOTE — Telephone Encounter (Signed)
I will look into changing and let you know how it goes.

## 2017-02-19 ENCOUNTER — Telehealth: Payer: Self-pay | Admitting: *Deleted

## 2017-02-19 NOTE — Telephone Encounter (Signed)
Thanks for all of your help, Janet Garcia! And that is way over the top!   Malachi BondsJoel Gallagher, MD FAAAAI [facility] [place]

## 2017-02-19 NOTE — Telephone Encounter (Signed)
Patient called to inquire status of Hyqvia to replace Hizentra. I advised her I have been in contact with [facility] and they have new rx as of last week an I  would followup today to check status. I contact Annette at [facility] and she advised that same is approved by insurance and would be sending shipment to patient for next week infusion in place of normal Hizentra and has nursing set up.  She had contacted patient and I also followed up to make sure patient new status. She advised that she was very happy and stated how wonderful Dr Dellis AnesGallagher is to keep up with trying to get her on medication to make her life easier.

## 2017-02-28 ENCOUNTER — Telehealth: Payer: Self-pay | Admitting: *Deleted

## 2017-02-28 NOTE — Telephone Encounter (Signed)
Dr Dellis AnesGallagher just wanted to let you know where we are with starting patient on Hyqvia. For the ramp up the [facility] has to arrange. I had forgotten that patient had previously used product and did ramp up. So here is the email from my contact at pharmacy. Just wanted to let you know where we are.   From Janet Garcia  I followed up with [employer] yesterday To make sure they had her in the system and ready to start on the free trial ramp up. They did have her in the system however advised me that she was not eligible for the free ramp up because she had already received it in 2016. I had totally forgotten she was on her QBR before she came to us and switched to his intro. When I spoke with Miss Vear Clockhillips she did not remind me of that either when I explain the situation.  I have spoken with the [facility] nurse educator regarding this and how we should proceed with her dosing and a quasi-ramp up.Her recommendation has been to split the 30 g dose into 2-15g doses, with 15gms given 2 weeks apart. For Medicare, Which we have auth for, she will still be getting 30 g, It will just be split. It will not mess up auth, billing, or reimbursement. We will be doing this quasi ramp up. The following month, She will infuse the full dose.  We will be sending over an order reflecting the 1- 15gm infusion biweekly, for Dr. Ellouise NewerGallagher's signature, if he is in agreement.  Mrs. Larner is totally aware of this plan. Now we are working diligently to get her started before Monday, as we believed [facility] do the ramp up. So glad I called them though to check on her status !!!  Please call if this is confusing. It's been a process. ;)).

## 2017-02-28 NOTE — Telephone Encounter (Signed)
Noted. I signed the form this morning and I believe Lyla SonCarrie was going to fax it back to you. Thanks for keeping me in the loop.  Malachi BondsJoel Evanna Washinton, MD [facility] [place]

## 2017-03-03 ENCOUNTER — Other Ambulatory Visit: Payer: Self-pay | Admitting: Allergy

## 2017-03-03 DIAGNOSIS — J3089 Other allergic rhinitis: Secondary | ICD-10-CM

## 2017-03-03 MED ORDER — FLUTICASONE FUROATE-VILANTEROL 200-25 MCG/INH IN AEPB: 1.0000 | INHALATION_SPRAY | Freq: Every day | RESPIRATORY_TRACT | 5 refills | Status: DC

## 2017-03-25 DIAGNOSIS — G4700 Insomnia, unspecified: Secondary | ICD-10-CM | POA: Insufficient documentation

## 2017-05-09 ENCOUNTER — Encounter: Payer: Self-pay | Admitting: Allergy & Immunology

## 2017-05-09 ENCOUNTER — Ambulatory Visit (INDEPENDENT_AMBULATORY_CARE_PROVIDER_SITE_OTHER): Payer: Medicare Other | Admitting: Allergy & Immunology

## 2017-05-09 VITALS — BP 112/76 | HR 92 | Temp 98.3°F | Resp 16

## 2017-05-09 DIAGNOSIS — D638 Anemia in other chronic diseases classified elsewhere: Secondary | ICD-10-CM

## 2017-05-09 DIAGNOSIS — E538 Deficiency of other specified B group vitamins: Secondary | ICD-10-CM

## 2017-05-09 DIAGNOSIS — J4550 Severe persistent asthma, uncomplicated: Secondary | ICD-10-CM

## 2017-05-09 DIAGNOSIS — D839 Common variable immunodeficiency, unspecified: Secondary | ICD-10-CM

## 2017-05-09 DIAGNOSIS — K219 Gastro-esophageal reflux disease without esophagitis: Secondary | ICD-10-CM | POA: Diagnosis not present

## 2017-05-09 DIAGNOSIS — J310 Chronic rhinitis: Secondary | ICD-10-CM

## 2017-05-09 DIAGNOSIS — J479 Bronchiectasis, uncomplicated: Secondary | ICD-10-CM

## 2017-05-09 MED ORDER — ALBUTEROL SULFATE HFA 108 (90 BASE) MCG/ACT IN AERS: 2.0000 | INHALATION_SPRAY | RESPIRATORY_TRACT | 2 refills | Status: DC | PRN

## 2017-05-09 NOTE — Patient Instructions (Addendum)
1. Common variable immunodeficiency - Continue with current dosing of Hyqvia.  - We will get some repeat labs today: IgG level  2. Bronchiectasis without complication - Follow up with Dr. Su MonksEjaz as scheduled.   - We will send our notes to Dr. Su MonksEjaz.  3. Severe persistent asthma, uncomplicated - Lung function looked stable today.  - Continue with Breo one puff once daily. - Continue with albuterol as needed.   4. Chronic rhinitis - Continue with Flonase one spray per nostril daily. - Consider using nasal saline rinses prior to using the Flonase.   5. Gastroesophageal reflux disease - Continue with omeprazole at the current dosing.  6. Anemia - Looked better with the last set of labs. - We will continue to monitor.   7. Opacity of lung on imaging study - Dr. Su MonksEjaz is following.  8. Return in about 6 months (around 11/07/2017).   Please inform us of any Emergency Department visits, hospitalizations, or changes in symptoms. Call us before going to the ED for breathing or allergy symptoms since we might be able to fit you in for a sick visit. Feel free to contact us anytime with any questions, problems, or concerns.  It was a pleasure to see you again today! Enjoy the fall season!  Websites that have reliable patient information: 1. American Academy of Asthma, Allergy, and Immunology: www.aaaai.org 2. Food Allergy Research and Education (FARE): foodallergy.org 3. Mothers of Asthmatics: http://www.asthmacommunitynetwork.org 4. American College of Allergy, Asthma, and Immunology: www.acaai.org   Election Day is coming up on Tuesday, November 6th! Although it is too late to register to vote by mail, you can still register up to November 5th at any of the early voting locations. Try to early vote in case there are problems with your registration!   If you are turned away at the polls, you have the right to request a provisional ballot, which is required by law!      [place]  [facility] (open 8am - 5pm) First Floor [address], [place]   [place] (open 8am - 5pm) [address], [place]   [facility] (open 7am - 7pm)  [address], [place]   [facility] (open 7am - 7pm) [address], [place]   [place] (open 7am - 7pm) [address], [place]   [facility] (open 7am - 7pm) [address], [place]   [facility] (open 7am - 7pm) [address], [place]   [address] (open 7am - 7pm) [address], [place]   [facility] (open 7am - 7pm) [address], [place]

## 2017-05-09 NOTE — Progress Notes (Signed)
FOLLOW UP  Date of Service/Encounter:  05/10/17   Assessment:   Severe persistent asthma without complication  Common variable immunodeficiency  Anemia of chronic disease  B12 deficiency  Bronchiectasis without complication  Chronic rhinitis  Gastroesophageal reflux disease - on a PPI  Chronic back pain - currently pursuing    Asthma Reportables:  Severity: severe persistent  Risk: high Control: well controlled   Plan/Recommendations:   1. Common variable immunodeficiency - Continue with current dosing of Hyqvia.  - We will get some repeat labs today: IgG level (1279). - Last infection was in spring 2018, therefore current IgG dosing seems adequate.   2. Bronchiectasis without complication - Follow up with Dr. Su Monks as scheduled.   - We will send our notes to Dr. Su Monks.  3. Severe persistent asthma, uncomplicated - Lung function looked stable today.  - Continue with Breo one puff once daily. - Continue with albuterol as needed.  - I did recommend that Ms. Goonan obtain an influenza vaccination.  - There is no need for a Prevnar since she is receiving IgG supplementation.   4. Chronic rhinitis - Continue with Flonase one spray per nostril daily. - Consider using nasal saline rinses prior to using the Flonase.   5. Gastroesophageal reflux disease - Continue with omeprazole at the current dosing. - There is no indication of GI disease at this time.   6. Anemia - Looked better with the last set of labs. - We will continue to monitor.   7. Fatigue - unknown etiology - Vitamin B12 level obtained, and was mildly decreased at 224 (normal [phone]). - I recommended that she add on a vitamin B12 supplement ( - ).   8. Opacity of lung on imaging study - Dr. Su Monks is following. - Next appointment with Dr. Su Monks is early November 2018.   9. Return in about 6 months (around 11/07/2017).  Subjective:   Janet Garcia is a 71 y.o. female presenting  today for follow up of  Chief Complaint  Patient presents with  . Asthma    Janet Garcia has a history of the following: Patient Active Problem List   Diagnosis Date Noted  . B12 deficiency 05/10/2017  . Recurrent infections 02/24/2016  . 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/24/2016  . Severe persistent asthma 02/24/2016  . Chronic rhinitis 02/24/2016  . Anemia of chronic disease 02/24/2016  . Opacity of lung on imaging study 02/24/2016  . Moderate persistent asthma 09/15/2015  . Esophageal reflux 09/15/2015  . Essential hypertension 09/15/2015  . Abnormal liver enzymes 07/26/2015  . 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 Lumbosacral spondylosis 07/26/2015  . Asthma, mild persistent 07/05/2015  . Abnormal CAT scan 06/30/2015  . PNA (pneumonia) 06/21/2015  . Anxiety 05/23/2015  . HLD (hyperlipidemia) 03/24/2015  . 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 Family history of colonic polyps 01/13/2015  . Recurrent disease 11/21/2014  . Essential (primary) hypertension 08/15/2014  . Anxiety, generalized 08/15/2014  . Left lower lobe pneumonia (HCC) 04/16/2014  . Arthritis, degenerative 04/16/2014  . Chronic infection of sinus 03/15/2014  . Bronchiectasis (HCC) 03/10/2014  . H/O respiratory system disease 03/10/2014  . Can't get food down 03/01/2014  . 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 Lumbar radiculopathy 02/28/2014  . Headache, migraine 02/28/2014  . Osteopenia 02/28/2014  . Airway hyperreactivity 02/24/2014  . Closed nondisplaced fracture of proximal phalanx of lesser toe of right foot 01/24/2014  . Allergic rhinitis 11/05/2013  .  Degeneration of intervertebral disc of lumbosacral region 11/05/2013  . Esophagitis, reflux 11/05/2013    History obtained from: chart review and the patient.  Janet Garcia's Primary Care Provider is [name], MD.     Janet Garcia is a 71 y.o.  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 Her full clinical history is laid out in the note from 02/23/16. At the visit in April 2018, she had endured a couple of months of influenza followed by a couple of episodes of sinusitis (one round of Augmentin and two rounds of cefdinir). Repeat labs were normal, including an IgG level and a CMP. We had been monitoring her hemoglobin, and that has been improving since I began following her in 2017.   Since the last visit, she has actually done quite well. She changed to [facility] around two months ago, and has done fairly well with this transition. She infuses once monthly for just over two hours. This has helped her social life and is quite pleased with how mobile she now is. She has had no breakthrough infections since March 2018 and has required no prednisone courses since the early spring. She has had an eventful month, as the [employer] has just been completed and she is active as a Freight forwarder. She does endorse some marked fatigue, but she attributes this to [employer] as well as all of her recent travels, including [place] and [place]. She has had normal thyroid studies in the recent past.   Bronchiectasis with Severe Persistent Asthma: Janet Garcia now using both her Acapella on a daily basis as recommended and she is using her incentive spirometry on a regular basis as well. She remains on Breo 200/25 one puff once daily. Janet Garcia's asthma has been well controlled.Shehas not required rescue medication, experienced nocturnal awakenings due to lower respiratory symptoms, nor have activities of daily living been limited. She does continue to have a chronic cough. She does have a history of hypoxia that is most prominent in the summer months, but she has not needed her supplemental O2 at all since the last visit. She has not needed her rescue inhaler at  all since the last visit. She feels that the [place] working quite well, but she continues to be affected by hoarseness.   Ground Glass Opacities with Concern for Interstitial Lung Disease: Janet Garcia's last chest CT showed clearance of the upper lobes. She has bronchiectasis with reticulonodular densities in the bilateral bases. She continues to follow with Dr. Su Monks every 6 months, and her next appointment is in early November 2018. Her daily azithromycin regimen was stopped at the last visit in May 2018, and she was instructed to restart it if the color of her sputum changed.  Allergic Rhinitis (positive to molds only): Janet Garcia on fluticasone two sprays per nostril daily as well as Allegra 180mg  daily. Her symptoms continue to be well controlled with this regimen. She does use an air purifier in her home 24 hours per day. She has never been on immunotherapy and is not interested in this at all.   GERD with a history of gastric polyps: Carolynhas a history of reflux that is treated with omeprazole 20mg  once daily. She does see [facility] Carman Ching at [facility]. Her last colonoscopy and endoscopy were in September 2016. The only finding was a gastric polyp which was removed. Otherwise there was no evidence of bleeding or other GI abnormalities. She does  not have a follow up with GI scheduled at this time.   Otherwise, there have been no changes to her past medical history, surgical history, family history, or social history. She continues to live at home with her husband, two cats, and one dog. She does travel quite a bit and spends time knitting and sewing. 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 Her daughter recently completed her Masters in Nursing and lives in [place]. Her daughter was able to get the post that she wanted at the hospital she was most  interested in, and her daughter did start training as a Immunologistmidwife. Her daughter also works part time as a Financial controllerflight attendant. Interestingly, her mother also cares for her ex-mother-in-law and this has been a somewhat stressful, as she has multiple health problems.     Review of Systems: a 14-point review of systems is pertinent for what is mentioned in HPI.  Otherwise, all other systems were negative. Constitutional: negative other than that listed in the HPI Eyes: negative other than that listed in the HPI Ears, nose, mouth, throat, and face: negative other than that listed in the HPI Respiratory: negative other than that listed in the HPI Cardiovascular: negative other than that listed in the HPI Gastrointestinal: negative other than that listed in the HPI Genitourinary: negative other than that listed in the HPI Integument: negative other than that listed in the HPI Hematologic: negative other than that listed in the HPI Musculoskeletal: negative other than that listed in the HPI Neurological: negative other than that listed in the HPI Allergy/Immunologic: negative other than that listed in the HPI    Objective:   Blood pressure 112/76, pulse 92, temperature 98.3 F (36.8 C), temperature source Oral, resp. rate 16, SpO2 96 %. There is no height or weight on file to calculate BMI.   Physical Exam:  General:Alert, interactive, in no acute distress.Very pleasant female. Talkative which is her normal state. Baseline nasal hoarse voice.  HEENT: TMs pearly gray bilaterally, turbinates edematouswith scant clear discharge, post-pharynx erythematous without cobblestoning. Tonsils unremarkable.  Neck:Supple without lymphadenopathy. No thyromegaly.  Lungs:Mildly decreased breath sounds bilaterally without wheezing Faint bibasilar crackles heard intermittently at the bases. No increased work of breathing. No retractions noted.  ZO:[id number]:Normal S1, S2 without murmurs.Capillary refill within  normal limits. Pulses 2+ on the bilateral upper extremities.  Abdomen: Nondistended, nontender, no masses. No guarding or rebound tenderness noted. Liver approximately 2cm below the costal margin. No spleen tip palpated whatsoever. Lymphatic:no adenopathy appreciated in the cervical, posterior auricular, axillary, epitrochlear, inguinal, or popliteal regions Skin:Warm and dry, without lesions or rashes. Extremities:No clubbing, cyanosis or edema. Neuro: Grossly intact.No focal deficits noted. Appropriate during the visit and responsive to commands.    Diagnostic studies:   Spirometry: results normal (FEV1: 1.61/85%, FVC: 1.96/78%, FEV1/FVC: 82%).    Spirometry consistent with normal pattern.   Allergy Studies: none     Malachi BondsJoel Lakeyia Surber, MD [facility] [place]

## 2017-05-10 DIAGNOSIS — E538 Deficiency of other specified B group vitamins: Secondary | ICD-10-CM | POA: Insufficient documentation

## 2017-05-10 LAB — IGG: IgG (Immunoglobin G), Serum: 1279 mg/dL (ref 700–1600)

## 2017-05-10 LAB — VITAMIN B12: Vitamin B-12: 224 pg/mL — ABNORMAL LOW (ref 232–1245)

## 2017-11-03 ENCOUNTER — Other Ambulatory Visit: Payer: Self-pay | Admitting: Allergy

## 2017-11-03 DIAGNOSIS — J3089 Other allergic rhinitis: Secondary | ICD-10-CM

## 2017-11-03 MED ORDER — FLUTICASONE FUROATE-VILANTEROL 200-25 MCG/INH IN AEPB
1.0000 | INHALATION_SPRAY | Freq: Every day | RESPIRATORY_TRACT | 1 refills | Status: DC
Start: 2017-11-03 — End: 2018-01-14

## 2017-11-07 ENCOUNTER — Ambulatory Visit: Payer: Medicare Other | Admitting: Allergy & Immunology

## 2017-11-21 ENCOUNTER — Encounter: Payer: Self-pay | Admitting: Allergy & Immunology

## 2017-11-21 ENCOUNTER — Ambulatory Visit (INDEPENDENT_AMBULATORY_CARE_PROVIDER_SITE_OTHER): Payer: Medicare Other | Admitting: Allergy & Immunology

## 2017-11-21 VITALS — BP 138/74 | HR 84 | Temp 98.3°F | Resp 12 | Ht 60.0 in | Wt 127.2 lb

## 2017-11-21 DIAGNOSIS — R918 Other nonspecific abnormal finding of lung field: Secondary | ICD-10-CM

## 2017-11-21 DIAGNOSIS — E538 Deficiency of other specified B group vitamins: Secondary | ICD-10-CM | POA: Diagnosis not present

## 2017-11-21 DIAGNOSIS — J479 Bronchiectasis, uncomplicated: Secondary | ICD-10-CM

## 2017-11-21 DIAGNOSIS — J4550 Severe persistent asthma, uncomplicated: Secondary | ICD-10-CM | POA: Diagnosis not present

## 2017-11-21 DIAGNOSIS — D839 Common variable immunodeficiency, unspecified: Secondary | ICD-10-CM | POA: Diagnosis not present

## 2017-11-21 DIAGNOSIS — K219 Gastro-esophageal reflux disease without esophagitis: Secondary | ICD-10-CM

## 2017-11-21 NOTE — Patient Instructions (Addendum)
1. Common variable immunodeficiency - Continue with current dosing of Hyqvia.  - We will plan to regroup in October and increase Hyqvia at that time.  - We will get some repeat labs today: IgG level  2. Bronchiectasis without complication - Follow up with Dr. Su Monks as scheduled.   - We will send our notes to Dr. Su Monks.  3. Severe persistent asthma, uncomplicated - Lung function looked quite normal today.  - Continue with Breo one puff once daily. - Continue with albuterol as needed.  4. Chronic rhinitis - Continue with Flonase one spray per nostril daily. - Consider using nasal saline rinses prior to using the Flonase.   5. Gastroesophageal reflux disease - Continue with omeprazole at the current dosing.  6. Return in about 5 months (around 04/24/2018).    Please inform us of any Emergency Department visits, hospitalizations, or changes in symptoms. Call us before going to the ED for breathing or allergy symptoms since we might be able to fit you in for a sick visit. Feel free to contact us anytime with any questions, problems, or concerns.  It was a pleasure to see you again today!  Websites that have reliable patient information: 1. American Academy of Asthma, Allergy, and Immunology: www.aaaai.org 2. Food Allergy Research and Education (FARE): foodallergy.org 3. Mothers of Asthmatics: http://www.asthmacommunitynetwork.org 4. American College of Allergy, Asthma, and Immunology: www.acaai.org
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
## 2017-11-25 ENCOUNTER — Encounter: Payer: Self-pay | Admitting: Allergy & Immunology

## 2017-11-29 LAB — IGG: IgG (Immunoglobin G), Serum: 1064 mg/dL (ref 700–1600)

## 2017-11-29 LAB — VITAMIN B12: Vitamin B-12: 744 pg/mL (ref 232–1245)

## 2018-01-14 ENCOUNTER — Other Ambulatory Visit: Payer: Self-pay | Admitting: Allergy

## 2018-01-14 DIAGNOSIS — J3089 Other allergic rhinitis: Secondary | ICD-10-CM

## 2018-01-14 MED ORDER — FLUTICASONE FUROATE-VILANTEROL 200-25 MCG/INH IN AEPB: 1.0000 | INHALATION_SPRAY | Freq: Every day | RESPIRATORY_TRACT | 3 refills | Status: DC

## 2018-01-19 DIAGNOSIS — I700 Atherosclerosis of aorta: Secondary | ICD-10-CM | POA: Insufficient documentation

## 2018-02-02 ENCOUNTER — Encounter: Payer: Self-pay | Admitting: Allergy & Immunology

## 2018-02-03 NOTE — Telephone Encounter (Signed)
Janet Garcia - Ms. Flis is having breakthrough infections on her current immunoglobulin dose. It seems that she is on 30 gm every 3 weeks of Hyqvia (~173mg /kg/week or ~693mg /kg/month). Can we increase her to 33 gm every 3 weeks instead (~190 mg/kg/week or ~762 mg/kg/month)?   I am not sure of the volume options for Hyqvia, but if that dose will require wasting of some of the product, we can just increase so that she gets the extra.   Thanks much!  Malachi BondsJoel Gallagher, MD [facility]

## 2018-02-06 ENCOUNTER — Encounter: Payer: Self-pay | Admitting: Allergy & Immunology

## 2018-02-06 ENCOUNTER — Telehealth: Payer: Self-pay

## 2018-02-06 NOTE — Telephone Encounter (Signed)
Dr. Dellis AnesGallagher,  I have yet another issue that I need to check with you to see if I can do.  I have a bone density scan scheduled. It has been suggested that I begin taking Prolia which is a human monoclonal antibody that is given every 6 months. I belive it is a shot.  Am I able to take this?   Getting Older is a B....! Not for the weak spirited!     Thanks.  [name]

## 2018-02-09 NOTE — Telephone Encounter (Signed)
L/M for [employer] to contact me for dose increase

## 2018-02-09 NOTE — Telephone Encounter (Signed)
Lm for pt to call us back  

## 2018-02-09 NOTE — Telephone Encounter (Signed)
I think that would be an excellent idea. I do not see a contraindication to that at all.  Thanks much,  Malachi BondsJoel Seng Fouts, MD [facility]

## 2018-02-12 NOTE — Telephone Encounter (Signed)
Lm for pt to call us back  

## 2018-02-16 NOTE — Telephone Encounter (Signed)
Informed pt of what dr Dellis AnesGallagher stated

## 2018-03-05 DIAGNOSIS — M1612 Unilateral primary osteoarthritis, left hip: Secondary | ICD-10-CM | POA: Insufficient documentation

## 2018-03-20 ENCOUNTER — Encounter: Payer: Self-pay | Admitting: Allergy & Immunology

## 2018-03-20 ENCOUNTER — Ambulatory Visit (INDEPENDENT_AMBULATORY_CARE_PROVIDER_SITE_OTHER): Payer: Medicare Other | Admitting: Allergy & Immunology

## 2018-03-20 VITALS — BP 126/74 | HR 97 | Temp 98.1°F | Resp 20

## 2018-03-20 DIAGNOSIS — K219 Gastro-esophageal reflux disease without esophagitis: Secondary | ICD-10-CM

## 2018-03-20 DIAGNOSIS — R918 Other nonspecific abnormal finding of lung field: Secondary | ICD-10-CM

## 2018-03-20 DIAGNOSIS — J479 Bronchiectasis, uncomplicated: Secondary | ICD-10-CM

## 2018-03-20 DIAGNOSIS — J4550 Severe persistent asthma, uncomplicated: Secondary | ICD-10-CM

## 2018-03-20 DIAGNOSIS — D839 Common variable immunodeficiency, unspecified: Secondary | ICD-10-CM | POA: Diagnosis not present

## 2018-03-20 DIAGNOSIS — D638 Anemia in other chronic diseases classified elsewhere: Secondary | ICD-10-CM

## 2018-03-20 DIAGNOSIS — E538 Deficiency of other specified B group vitamins: Secondary | ICD-10-CM

## 2018-03-20 DIAGNOSIS — J0100 Acute maxillary sinusitis, unspecified: Secondary | ICD-10-CM

## 2018-03-20 MED ORDER — CLARITHROMYCIN 500 MG PO TABS: 500.0000 mg | ORAL_TABLET | Freq: Two times a day (BID) | ORAL | 0 refills | Status: AC

## 2018-03-20 MED ORDER — FLUTICASONE FUROATE 200 MCG/ACT IN AEPB: 1.0000 | INHALATION_SPRAY | Freq: Two times a day (BID) | RESPIRATORY_TRACT | 3 refills | Status: DC

## 2018-03-20 NOTE — Patient Instructions (Addendum)
1. Common variable immunodeficiency - Continue with the elevated dosing of Hyqvia.  - We will see how your higher doing of IgG goes over the winter months.  2. Bronchiectasis without complication - Follow up with Dr. Su MonksEjaz as scheduled.   - We will send our notes to Dr. Su MonksEjaz.  3. Severe persistent asthma, uncomplicated - Lung function looked quite normal today.  - Stop the Breo for now and start Trelegy one puff once daily (email me to let me know how you feel with this one).  - We will also add on an inhaled steroid (Arnuity) to use during respiratory flares to try to avoid systemic steroids.  - Daily controller medication(s): Trelegy 100/62.5/25 one puff once daily (to replace Breo for now)  - Prior to physical activity: ProAir 2 puffs 10-15 minutes before physical activity. - Rescue medications: ProAir 4 puffs every 4-6 hours as needed or albuterol nebulizer one vial every 4-6 hours as needed - Changes during respiratory infections or worsening symptoms: Add on Arnuity 200mcg to one inhalation twice daily for TWO WEEKS. - Asthma control goals:  * Full participation in all desired activities (may need albuterol before activity) * Albuterol use two time or less a week on average (not counting use with activity) * Cough interfering with sleep two time or less a month * Oral steroids no more than once a year * No hospitalizations  4. Chronic rhinitis - with overlying sinusitis - Continue with Flonase one spray per nostril daily. - Consider using nasal saline rinses prior to using the Flonase.  - Continue with alternating Claritin and Allegra. - Start clarithromycin 500mg  twice daily for two weeks.  5. Gastroesophageal reflux disease - Continue with omeprazole at the current dosing.  6. Return in about 3 months (around 06/20/2018).    Please inform us of any Emergency Department visits, hospitalizations, or changes in symptoms. Call us before going to the ED for breathing or allergy  symptoms since we might be able to fit you in for a sick visit. Feel free to contact us anytime with any questions, problems, or concerns.  It was a pleasure to see you again today!  Websites that have reliable patient information: 1. American Academy of Asthma, Allergy, and Immunology: www.aaaai.org 2. Food Allergy Research and Education (FARE): foodallergy.org 3. Mothers of Asthmatics: http://www.asthmacommunitynetwork.org 4. American College of Allergy, Asthma, and Immunology: www.acaai.org
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
## 2018-03-24 ENCOUNTER — Encounter: Payer: Self-pay | Admitting: Allergy & Immunology

## 2018-05-01 ENCOUNTER — Other Ambulatory Visit: Payer: Self-pay

## 2018-05-04 ENCOUNTER — Telehealth: Payer: Self-pay | Admitting: Allergy & Immunology

## 2018-05-04 NOTE — Telephone Encounter (Signed)
Patient called and had been seen by her PCP for her 6 mo checkup with blood work. The PCP told her that her white blood count was running a little low and that she should contact Dr. Dellis Anes.

## 2018-05-05 NOTE — Telephone Encounter (Signed)
I cannot seem to see those results, unless I am not looking in the right place.  Could you call her primary care doctor and get the results sent over to [place]?  [name], MD [facility]

## 2018-05-11 ENCOUNTER — Encounter: Payer: Self-pay | Admitting: Allergy & Immunology

## 2018-05-11 NOTE — Telephone Encounter (Signed)
Lm with a nurse at Jobe's office to fax over most current lab results

## 2018-05-11 NOTE — Telephone Encounter (Signed)
I printed out the lab reports from 05/01/18 from [facility].  CBC, U/A with microscopic and culture.  Will fax over to [facility] today for Dr. Dellis Anes to review.  Will scan into [facility].

## 2018-05-12 NOTE — Telephone Encounter (Signed)
I will take a look when I get to work today.  [name], MD [facility]

## 2018-06-03 ENCOUNTER — Other Ambulatory Visit: Payer: Self-pay | Admitting: Allergy

## 2018-06-03 DIAGNOSIS — J3089 Other allergic rhinitis: Secondary | ICD-10-CM

## 2018-06-03 MED ORDER — FLUTICASONE FUROATE-VILANTEROL 200-25 MCG/INH IN AEPB: 1.0000 | INHALATION_SPRAY | Freq: Every day | RESPIRATORY_TRACT | 3 refills | Status: DC

## 2018-07-03 ENCOUNTER — Ambulatory Visit: Payer: Medicare Other | Admitting: Allergy & Immunology

## 2018-07-09 ENCOUNTER — Encounter: Payer: Self-pay | Admitting: Allergy & Immunology

## 2018-07-09 ENCOUNTER — Ambulatory Visit (INDEPENDENT_AMBULATORY_CARE_PROVIDER_SITE_OTHER): Payer: Medicare Other | Admitting: Allergy & Immunology

## 2018-07-09 VITALS — BP 124/72 | HR 97 | Temp 98.4°F | Resp 20

## 2018-07-09 DIAGNOSIS — J4550 Severe persistent asthma, uncomplicated: Secondary | ICD-10-CM | POA: Diagnosis not present

## 2018-07-09 DIAGNOSIS — D839 Common variable immunodeficiency, unspecified: Secondary | ICD-10-CM

## 2018-07-09 DIAGNOSIS — E538 Deficiency of other specified B group vitamins: Secondary | ICD-10-CM

## 2018-07-09 DIAGNOSIS — E559 Vitamin D deficiency, unspecified: Secondary | ICD-10-CM

## 2018-07-09 NOTE — Patient Instructions (Addendum)
1. Common variable immunodeficiency - Continue with the elevated dosing of Hyqvia.  - It seems that this is doing a great job wit - My work cell is [phone] if you ever need it.  - I would like to get some more labs, but I want to figure out to order this genetic testing first.   2. Bronchiectasis without complication - Follow up with Dr. Su MonksEjaz as needed.  3. Severe persistent asthma, uncomplicated - Lung function looked very normal today.  - Daily controller medication(s): Breo 200/4225mcg one puff once daily - Prior to physical activity: ProAir 2 puffs 10-15 minutes before physical activity. - Rescue medications: ProAir 4 puffs every 4-6 hours as needed or albuterol nebulizer one vial every 4-6 hours as needed - Changes during respiratory infections or worsening symptoms: Add on Arnuity 200mcg to one inhalation twice daily for TWO WEEKS. - Asthma control goals:  * Full participation in all desired activities (may need albuterol before activity) * Albuterol use two time or less a week on average (not counting use with activity) * Cough interfering with sleep two time or less a month * Oral steroids no more than once a year * No hospitalizations  4. Chronic rhinitis - Continue with Flonase one spray per nostril daily. - Consider using nasal saline rinses prior to using the Flonase.   5. Gastroesophageal reflux disease - Continue with omeprazole at the current dosing.  6. Return in about 3 months (around 10/08/2018).    Please inform us of any Emergency Department visits, hospitalizations, or changes in symptoms. Call us before going to the ED for breathing or allergy symptoms since we might be able to fit you in for a sick visit. Feel free to contact us anytime with any questions, problems, or concerns.  It was a pleasure to see you again today!  Websites that have reliable patient information: 1. American Academy of Asthma, Allergy, and Immunology: www.aaaai.org 2. Food  Allergy Research and Education (FARE): foodallergy.org 3. Mothers of Asthmatics: http://www.asthmacommunitynetwork.org 4. American College of Allergy, Asthma, and Immunology: www.acaai.org

## 2018-07-09 NOTE — Progress Notes (Signed)
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 Su MonksEjaz annually  Chronic rhinitis - with acute sinusitis  Gastroesophageal reflux Garcia- on a PPI  Chronic back pain- s/p physical therapy    Plan/Recommendations:   1. Common variable immunodeficiency - Continue with the elevated dosing of Hyqvia.  - It seems that this is doing a great job with keeping your infections at bay.  - We will get labs: IgG level, Primary Immunodeficiency Panel from [facility], CBC with diff, CMP, vitamin levels (B12, iron studies, vitamin D)  - Labs ordered through [facility] and the patient was called to let her know.    2. Bronchiectasis without complication - Follow up with Dr. Su MonksEjaz as needed.  3. Severe persistent asthma, uncomplicated - Lung function looked very normal today.  - Daily controller medication(s): Breo 200/7525mcg one puff once daily - Prior to physical activity: ProAir 2 puffs 10-15 minutes before physical activity. - Rescue medications: ProAir 4 puffs every 4-6 hours as needed or albuterol nebulizer one vial every 4-6 hours as needed - Changes during respiratory infections or worsening symptoms: Add on Arnuity 200mcg to one inhalation twice daily for TWO WEEKS. - Asthma control goals:  * Full participation in all desired activities (may need albuterol before activity) * Albuterol use two time or less a week on average (not counting use with activity) * Cough interfering with sleep two time or less a month * Oral steroids no more than once a year * No hospitalizations  4. Chronic rhinitis - Continue with Flonase one spray per nostril daily. - Consider using nasal saline rinses prior to using  the Flonase.   5. Gastroesophageal reflux Garcia - Continue with omeprazole at the current dosing.  6. Return in about 3 months (around 10/08/2018).    Subjective:   Janet Garcia is a 72 y.o. female presenting today for follow up of  Chief Complaint  Patient presents with  . Follow-up    Janet LungerCarolyn Garcia has a history of the following: Patient Active Problem List   Diagnosis Date Noted  . B12 deficiency 05/10/2017  . Recurrent infections 02/24/2016  . 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/24/2016  . Severe persistent asthma 02/24/2016  . Chronic rhinitis 02/24/2016  . Anemia of chronic Garcia 02/24/2016  . Opacity of lung on imaging study 02/24/2016  . Moderate persistent asthma 09/15/2015  . Esophageal reflux 09/15/2015  . Essential hypertension 09/15/2015  . Abnormal liver enzymes 07/26/2015  . Janet Garcia 07/26/2015  . 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 Lumbosacral spondylosis 07/26/2015  . Asthma, mild persistent 07/05/2015  . Abnormal CAT scan 06/30/2015  . PNA (pneumonia) 06/21/2015  . Anxiety 05/23/2015  . HLD (hyperlipidemia) 03/24/2015  . 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 Family history of colonic polyps 01/13/2015  . Recurrent Garcia 11/21/2014  . Essential (primary) hypertension 08/15/2014  . Anxiety, generalized 08/15/2014  . Left lower lobe pneumonia (HCC) 04/16/2014  . Arthritis, degenerative 04/16/2014  . Chronic infection of sinus 03/15/2014  . Bronchiectasis (HCC) 03/10/2014  . H/O respiratory system Garcia 03/10/2014  . Can't get food down 03/01/2014  . 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 Lumbar radiculopathy 02/28/2014  . Headache, migraine 02/28/2014  . Osteopenia 02/28/2014  . Airway hyperreactivity 02/24/2014  .  Closed nondisplaced fracture of proximal phalanx of lesser toe of right foot 01/24/2014  . Allergic rhinitis 11/05/2013  . Degeneration of intervertebral  disc of lumbosacral region 11/05/2013  . Esophagitis, reflux 11/05/2013    History obtained from: chart review and patient.  Janet Garcia's Primary Care Provider is NONE AT THIS TIME.     PCP: NONE Pulmonologist: Dr. Ralph Leyden Gastroenterologist: Dr. Carman Ching Spine Surgeon: Dr. Maia Breslow is a 72 y.o. female presenting for a follow up visit. Janet Garcia is a delightful 72 y.o. female with a history of CVID as well as severe persistent asthma and bronchiectasis presenting for a follow up appointment. She was last seen in August 2019 for an acute visit due to a sinus infection and overall fatigue. Her anemia and B12 deficiency were well controlled.  Severe persistent asthma was under good control with Breo alone.  Her bronchiectasis was not a problem, and her chronic rhinitis was controlled with a nasal steroid only.   Since the last visit, she has done very well. She looks very good today and reports that she has a lot more energy than she typically does. Since increasing the dose of Hyqvia, she has not needed any antibiotics. I did provide her with an antibiotic at the last visit, and she did end up taking it out of an abundance of caution. However, she has not had any signs of an infection since that time. She did receive her influenza vaccination.   She does need some oral surgery done on January 2nd. Unfortunately, her PCP has left [facility]. 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 She has never received any antibiotics for previous dental surgery. However, they are going to be completely removing some of her teeth. She has a history of poor dentition, despite adequate oral hygiene. She thinks that this is genetic.   She went to see Dr. Su Monks at the first of the month and she was told to follow up as needed. She is not even doing her breathing device, which she was doing routinely to break up mucous. He did not feel that she needed any chest CT monitoring. She has been breathing  well with the Breo once daily. She did try the Trelegy at the last visit, but then had worsening hoarseness. She said her insurance coverage was not great for Trelegy either.   She remains on the nasal steroid one spray per nostril daily. She is using nasal saline rinses as well. She is on the omeprazole for her GERD. She has tried stopping this in the past, but her symptoms acutely return. She is going to need to find a new PCP since hers left the [facility] system. Her husband has a GP whom he likes, therefore she will likely make an appointment with him.   Otherwise, there have been no changes to her past medical history, surgical history, family history, or social history. 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    Review of Systems: a 14-point review of systems is pertinent for what is mentioned in HPI.  Otherwise, all other systems were negative.  Constitutional: negative other than that listed in the HPI Eyes: negative other than that listed in the HPI Ears, nose, mouth, throat, and face: negative other than that listed in the HPI Respiratory: negative other than that listed in the HPI Cardiovascular: negative other than that listed in the HPI Gastrointestinal: negative other than that listed  in the HPI Genitourinary: negative other than that listed in the HPI Integument: negative other than that listed in the HPI Hematologic: negative other than that listed in the HPI Musculoskeletal: negative other than that listed in the HPI Neurological: negative other than that listed in the HPI Allergy/Immunologic: negative other than that listed in the HPI    Objective:   Blood pressure 124/72, pulse 97, temperature 98.4 F (36.9 C), temperature source Oral, resp. rate 20, SpO2 90 %. There is no height or weight on file to calculate BMI.   Physical Exam:  General: Alert, interactive, in no acute distress. Normal hoarse voice.  Eyes: No  conjunctival injection bilaterally, no discharge on the right, no discharge on the left and no Horner-Trantas dots present. PERRL bilaterally. EOMI without pain. No photophobia.  Ears: Right TM pearly gray with normal light reflex, Left TM pearly gray with normal light reflex, Right TM intact without perforation and Left TM intact without perforation.  Nose/Throat: External nose within normal limits and septum midline. Turbinates edematous and pale with clear discharge. Posterior oropharynx erythematous without cobblestoning in the posterior oropharynx. Tonsils 2+ without exudates.  Tongue without thrush. Lungs: Clear to auscultation without wheezing, rhonchi or rales. No increased work of breathing. CV: Normal S1/S2. No murmurs. Capillary refill <2 seconds.  Skin: Warm and dry, without lesions or rashes. Neuro:   Grossly intact. No focal deficits appreciated. Responsive to questions.  Diagnostic studies:   Spirometry: results normal (FEV1: 1.57/86%, FVC: 1.98/81%, FEV1/FVC: 79%).    Spirometry consistent with normal pattern.  Allergy Studies: none       Malachi BondsJoel Gallagher, MD  [facility]

## 2018-10-15 ENCOUNTER — Ambulatory Visit: Payer: Medicare Other | Admitting: Allergy & Immunology

## 2018-10-24 LAB — VITAMIN D 1,25 DIHYDROXY
Vitamin D 1, 25 (OH)2 Total: 48 pg/mL
Vitamin D2 1, 25 (OH)2: <10 pg/mL
Vitamin D3 1, 25 (OH)2: 48 pg/mL

## 2018-10-24 LAB — CMP14+EGFR
ALT: 19 IU/L (ref 0–32)
AST: 27 IU/L (ref 0–40)
Albumin/Globulin Ratio: 1.4 (ref 1.2–2.2)
Albumin: 4.2 g/dL (ref 3.7–4.7)
Alkaline Phosphatase: 78 IU/L (ref 39–117)
BUN/Creatinine Ratio: 16 (ref 12–28)
BUN: 11 mg/dL (ref 8–27)
Bilirubin Total: 0.3 mg/dL (ref 0.0–1.2)
CO2: 24 mmol/L (ref 20–29)
Calcium: 9.4 mg/dL (ref 8.7–10.3)
Chloride: 106 mmol/L (ref 96–106)
Creatinine, Ser: 0.68 mg/dL (ref 0.57–1.00)
GFR calc Af Amer: 101 mL/min/{1.73_m2} (ref >59)
GFR calc non Af Amer: 88 mL/min/{1.73_m2} (ref >59)
Globulin, Total: 3 g/dL (ref 1.5–4.5)
Glucose: 90 mg/dL (ref 65–99)
Potassium: 4 mmol/L (ref 3.5–5.2)
Sodium: 146 mmol/L — ABNORMAL HIGH (ref 134–144)
Total Protein: 7.2 g/dL (ref 6.0–8.5)

## 2018-10-24 LAB — CBC WITH DIFFERENTIAL/PLATELET
Basophils Absolute: 0 10*3/uL (ref 0.0–0.2)
Basos: 1 %
EOS (ABSOLUTE): 0 10*3/uL (ref 0.0–0.4)
Eos: 0 %
Hematocrit: 36.3 % (ref 34.0–46.6)
Hemoglobin: 12.7 g/dL (ref 11.1–15.9)
Immature Grans (Abs): 0 10*3/uL (ref 0.0–0.1)
Immature Granulocytes: 0 %
Lymphocytes Absolute: 1.1 10*3/uL (ref 0.7–3.1)
Lymphs: 28 %
MCH: 32.9 pg (ref 26.6–33.0)
MCHC: 35 g/dL (ref 31.5–35.7)
MCV: 94 fL (ref 79–97)
Monocytes Absolute: 0.4 10*3/uL (ref 0.1–0.9)
Monocytes: 9 %
Neutrophils Absolute: 2.4 10*3/uL (ref 1.4–7.0)
Neutrophils: 62 %
Platelets: 238 10*3/uL (ref 150–450)
RBC: 3.86 x10E6/uL (ref 3.77–5.28)
RDW: 13 % (ref 11.7–15.4)
WBC: 3.8 10*3/uL (ref 3.4–10.8)

## 2018-10-24 LAB — IGG: IgG (Immunoglobin G), Serum: 1489 mg/dL (ref 586–1602)

## 2018-10-24 LAB — VITAMIN B12: Vitamin B-12: 520 pg/mL (ref 232–1245)

## 2018-11-02 ENCOUNTER — Other Ambulatory Visit: Payer: Self-pay

## 2018-11-02 DIAGNOSIS — J3089 Other allergic rhinitis: Secondary | ICD-10-CM

## 2018-11-02 MED ORDER — FLUTICASONE FUROATE-VILANTEROL 200-25 MCG/INH IN AEPB: 1.0000 | INHALATION_SPRAY | Freq: Every day | RESPIRATORY_TRACT | 3 refills | Status: DC

## 2019-02-23 ENCOUNTER — Encounter: Payer: Self-pay | Admitting: Allergy & Immunology

## 2019-02-26 ENCOUNTER — Ambulatory Visit (INDEPENDENT_AMBULATORY_CARE_PROVIDER_SITE_OTHER): Payer: Medicare Other | Admitting: Allergy & Immunology

## 2019-02-26 ENCOUNTER — Encounter: Payer: Self-pay | Admitting: Allergy & Immunology

## 2019-02-26 ENCOUNTER — Other Ambulatory Visit: Payer: Self-pay

## 2019-02-26 DIAGNOSIS — E538 Deficiency of other specified B group vitamins: Secondary | ICD-10-CM | POA: Diagnosis not present

## 2019-02-26 DIAGNOSIS — J4550 Severe persistent asthma, uncomplicated: Secondary | ICD-10-CM

## 2019-02-26 DIAGNOSIS — J479 Bronchiectasis, uncomplicated: Secondary | ICD-10-CM | POA: Diagnosis not present

## 2019-02-26 DIAGNOSIS — D839 Common variable immunodeficiency, unspecified: Secondary | ICD-10-CM

## 2019-02-26 DIAGNOSIS — J310 Chronic rhinitis: Secondary | ICD-10-CM

## 2019-02-26 DIAGNOSIS — K219 Gastro-esophageal reflux disease without esophagitis: Secondary | ICD-10-CM

## 2019-02-26 DIAGNOSIS — D638 Anemia in other chronic diseases classified elsewhere: Secondary | ICD-10-CM

## 2019-02-26 DIAGNOSIS — E559 Vitamin D deficiency, unspecified: Secondary | ICD-10-CM

## 2019-02-26 NOTE — Progress Notes (Signed)
RE: Janet Garcia MRN: [id number] DOB: Nov 29, 1945 Date of Telemedicine Visit: 02/26/2019  Referring provider: Lilian Garcia., MD Primary care provider: [name], PA-C  Chief Complaint: Asthma (using Breo daily, only occasional use of albuterol inhaler. no real breathing issues. ) and Recurrent Infections   Telemedicine Follow Up Visit via Telephone: I connected with Janet Garcia for a follow up on 02/26/19 by telephone and verified that I am speaking with the correct person using two identifiers.   I discussed the limitations, risks, security and privacy concerns of performing an evaluation and management service by telephone and the availability of in person appointments. I also discussed with the patient that there may be a patient responsible charge related to this service. The patient expressed understanding and agreed to proceed.  Patient is at home.  Provider is at the office.  Visit start time: 11:00 AM Visit end time: 11:48 AM Insurance consent/check in by: Janet Garcia Medical consent and medical assistant/nurse: Lonn Georgia  PCP: PA Janet Garcia Gastroenterologist: Dr. Laurence Spates Spine Surgeon: Dr. Ivan Croft   History of Present Illness:  She is a 73 y.o. female, who is being followed for CVID as well as severe persistent asthma. Her previous allergy office visit was in December 2019 with myself.  At that visit, we continued with her dosing of HyQvia.  We did get repeat labs including an IgG level, complete blood count, metabolic panel, vitamin levels, and genetic testing for primary immune deficiency panel.  Unfortunately, the genetic testing was not covered by her insurance.  Everything else looked excellent.  For bronchiectasis, she was doing very well.  She was only seen her pulmonologist on an as-needed basis.  Her lung function looked normal.  We continued Breo 200/25 mcg 1 puff once daily with albuterol as needed.  During periods of respiratory distress, she  was asked to add on Arnuity 200 mcg 1 puff twice daily for 2 weeks.  For her rhinitis, we continued Flonase and nasal saline rinses. GERD was controlled with omeprazole.  Since last visit, she has done very well.  However, she has started feeling ill this past week.  All of her symptoms started on Monday. She is not convinced that it is anything more than what she normally does, but given the pandemic she ended up seeking her treatment. She reports that she has a "basketball" in her head with a lot of drainage. She did have some fluid behind her ear. Pulse ox was 98% and she was afebrile. She feels that she has the flu. Yesterday she went to [facility] and ended up having COVID testing. This is still pending. She did not have flu testing performed.   She got up around 5am yesterday and took two Advil and slept several more hours. She never sleeps that much. She was told to use Flonase and add Allegra. This combination has not done much thus far. 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 She goes to the grocery store and the [employer] and then that is it. She not going out to eat or doing anything concerning. She has met her daughter in law for lunch once.   Her daughter is working on Dance movement psychotherapist to become a Chief Executive Officer. 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 The pandemic has made everything more difficult. She was going to get married next week but obviously that is not happening. Her husband  is a Insurance underwriter for [employer] and he was recently furloughed.    She needs surgery on her spine but she has all of these compression fractures. She is now on Prolia every six months to help with the osteoporosis. She is going to call him back to check on the date of the surgery.   She got a new pump and it is one hour and 45 minutes for the infusion itself. It probably takes 2.5 hours in total. She has had  no problems with the infusions at all. She denies pain or leakage from the infusion site. She has not required any antibiotics since last time I saw her.  In fact, she feels that she has never felt this good since she received her diagnosis for 5 years ago.  From a breathing perspective, she remains on her Breo 1 puff once daily.  She has had no problems with her asthma.  She has not required any prednisone or emergency room visits for her asthma.  ACT score is 25 today, indicating excellent asthma control.  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 They are acid dyes and she keeps her fans on . She has a group of four and they have a large studio. They do wear masks.   Otherwise, there have been no changes to her past medical history, surgical history, family history, or social history.  Assessment and Plan:  Randee is a 73 y.o. 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 Camillo Flaming annually  Chronic rhinitis- with acute sinusitis  Gastroesophageal reflux disease- on a PPI  Chronic back pain- s/p physical therapy   1. Common variable immunodeficiency - We will continue with the elevated dosing of Hyqvia.  - It seems that this is doing a great job with decreasing your infections, as you have not needed an an antibiotic in quite some time.  - We can hold off on repeat labs at this point, but we can do them at your next appointment.   2. Viral illness - COVID testing negative (updated after the appointment) - I recommended supportive therapy with an update through the weekend.  - I might need to add on an antibiotic if she develops a secondary bacterial infection.   3.  Bronchiectasis without complication - Follow up with Dr. Camillo Flaming as needed.  4. Severe persistent asthma, uncomplicated - Lung function looked very normal today.  - Daily controller medication(s): Breo 200/25mg one puff once daily - Prior to physical activity: ProAir 2 puffs 10-15 minutes before physical activity. - Rescue medications: ProAir 4 puffs every 4-6 hours as needed or albuterol nebulizer one vial every 4-6 hours as needed - Changes during respiratory infections or worsening symptoms: Add on Arnuity 2076m to one inhalation twice daily for TWO WEEKS. - Asthma control goals:  * Full participation in all desired activities (may need albuterol before activity) * Albuterol use two time or less a week on average (not counting use with activity) * Cough interfering with sleep two time or less a month * Oral steroids no more than once a year * No hospitalizations  5. Chronic rhinitis - Continue with Flonase one spray per nostril daily. - Consider using nasal saline rinses prior to using the Flonase.   6.  Gastroesophageal reflux disease - Continue with omeprazole at the current dosing.  7. Return in about 2 months (around 04/28/2019).   Diagnostics: None.  Medication List:  Current Outpatient Medications  Medication Sig Dispense Refill   albuterol (PROAIR HFA) 108 (90 Base) MCG/ACT inhaler Inhale 2 puffs into the lungs every 4 (four) hours as needed for wheezing or shortness of breath. 1 Inhaler 2   albuterol (PROVENTIL) (2.5 MG/3ML) 0.083% nebulizer solution 2.5 mg.     ALPRAZolam (XANAX) 0.25 MG tablet Take 0.25 mg by mouth.     amLODipine (NORVASC) 5 MG tablet      Artificial Tear Solution (SYSTANE CONTACTS OP) Apply to eye.     atorvastatin (LIPITOR) 20 MG tablet Take 20 mg by mouth.     Calcium Carbonate-Vitamin D (CALCIUM 600+D) 600-200 MG-UNIT TABS Take 2,000 tablets by mouth.      Cholecalciferol (VITAMIN D3) 2000 units capsule Take by mouth.     denosumab  (PROLIA) 60 MG/ML SOSY injection Prolia     EPIPEN 2-PAK 0.3 MG/0.3ML SOAJ injection   0   Famotidine (PEPCID PO) Take by mouth.     fluticasone furoate-vilanterol (BREO ELLIPTA) 200-25 MCG/INH AEPB Inhale 1 puff into the lungs daily. Inhale into the lungs. 1 each 3   ibuprofen (ADVIL,MOTRIN) 200 MG tablet Take 200 mg by mouth.     Immune Globulin-Hyaluronidase (HYQVIA) 30 GM/300ML KIT Inject into the skin. q 3 weeks     loratadine (CLARITIN) 10 MG tablet Take by mouth.     montelukast (SINGULAIR) 10 MG tablet      Multiple Vitamin (MULTIVITAMIN) tablet Take by mouth.     omeprazole (PRILOSEC) 20 MG capsule      sertraline (ZOLOFT) 50 MG tablet Take by mouth.     vitamin B-12 (CYANOCOBALAMIN) 1000 MCG tablet Take by mouth.     No current facility-administered medications for this visit.    Allergies: Allergies  Allergen Reactions   Guaifenesin Other (See Comments)    Other reaction(s): DIZZINESS  Mucinex D   Guaifenesin Er     Other reaction(s): DIZZINESS  Mucinex D Other reaction(s): DIZZINESS  Mucinex D   Levofloxacin Other (See Comments)    Double vision Double vision Double vision Double vision   Codeine Nausea Only    Can tolerate if she eats prior to taking medication. Can tolerate if she eats prior to taking medication.   Erythromycin Nausea Only    Patient reports she CAN TAKE Zithromax without problem Patient reports she CAN TAKE Zithromax without problem   Sulfa Antibiotics Nausea Only   Sulfur Nausea Only   I reviewed her past medical history, social history, family history, and environmental history and no significant changes have been reported from previous visits.  Review of Systems  Constitutional: Positive for chills and fatigue. Negative for activity change and appetite change.  HENT: Positive for congestion, rhinorrhea, sinus pressure, sinus pain and sore throat. Negative for ear discharge, ear pain, nosebleeds and postnasal drip.     Eyes: Negative for pain, discharge, redness and itching.  Respiratory: Negative for apnea, cough, shortness of breath, wheezing and stridor.   Gastrointestinal: Negative for diarrhea, nausea and vomiting.  Musculoskeletal: Negative for arthralgias, joint swelling and myalgias.  Skin: Negative for rash.  Allergic/Immunologic: Negative for environmental allergies and food allergies.    Objective:  Physical exam not obtained as encounter was done via telephone.   Previous notes and tests were reviewed.  I discussed the assessment and treatment plan  with the patient. The patient was provided an opportunity to ask questions and all were answered. The patient agreed with the plan and demonstrated an understanding of the instructions.   The patient was advised to call back or seek an in-person evaluation if the symptoms worsen or if the condition fails to improve as anticipated.  I provided 48 minutes of non-face-to-face time during this encounter.  It was my pleasure to participate in Garnavillo Kutch's care today. Please feel free to contact me with any questions or concerns.   Sincerely,  Valentina Shaggy, MD

## 2019-02-26 NOTE — Patient Instructions (Addendum)
1. Common variable immunodeficiency - We will continue with the elevated dosing of Hyqvia.  - It seems that this is doing a great job with decreasing your infections, as you have not needed an an antibiotic in quite some time.  - We can hold off on repeat labs at this point, but we can do them at your next appointment.   2. Bronchiectasis without complication - Follow up with Dr. Camillo Flaming as needed.  3. Severe persistent asthma, uncomplicated - Lung function looked very normal today.  - Daily controller medication(s): Breo 200/31mcg one puff once daily - Prior to physical activity: ProAir 2 puffs 10-15 minutes before physical activity. - Rescue medications: ProAir 4 puffs every 4-6 hours as needed or albuterol nebulizer one vial every 4-6 hours as needed - Changes during respiratory infections or worsening symptoms: Add on Arnuity 224mcg to one inhalation twice daily for TWO WEEKS. - Asthma control goals:  * Full participation in all desired activities (may need albuterol before activity) * Albuterol use two time or less a week on average (not counting use with activity) * Cough interfering with sleep two time or less a month * Oral steroids no more than once a year * No hospitalizations  4. Chronic rhinitis - Continue with Flonase one spray per nostril daily. - Consider using nasal saline rinses prior to using the Flonase.   5. Gastroesophageal reflux disease - Continue with omeprazole at the current dosing.  6. Return in about 2 months (around 04/28/2019).    Please inform us of any Emergency Department visits, hospitalizations, or changes in symptoms. Call us before going to the ED for breathing or allergy symptoms since we might be able to fit you in for a sick visit. Feel free to contact us anytime with any questions, problems, or concerns.  It was a pleasure to see you again today!  Websites that have reliable patient information: 1. American Academy of Asthma, Allergy, and  Immunology: www.aaaai.org 2. Food Allergy Research and Education (FARE): foodallergy.org 3. Mothers of Asthmatics: http://www.asthmacommunitynetwork.org 4. American College of Allergy, Asthma, and Immunology: www.acaai.org

## 2019-03-02 ENCOUNTER — Telehealth: Payer: Medicare Other | Admitting: Allergy & Immunology

## 2019-03-05 ENCOUNTER — Telehealth: Payer: Self-pay | Admitting: *Deleted

## 2019-03-05 MED ORDER — EPIPEN 2-PAK 0.3 MG/0.3ML IJ SOAJ: 0.3000 mg | INTRAMUSCULAR | 1 refills | Status: AC | PRN

## 2019-03-05 NOTE — Telephone Encounter (Signed)
Rx has been signed and faxed to [phone].

## 2019-03-05 NOTE — Telephone Encounter (Signed)
[facility] [phone] [place], [place], [place] [address])  called on behalf of pt requesting a refill of epipen faxed to [phone]. Printed Rx and faxed to Dr. Ernst Bowler for sig.

## 2019-03-05 NOTE — Telephone Encounter (Signed)
Noted. Thanks for letting me know!   Salihah Peckham, MD [facility]  

## 2019-03-10 ENCOUNTER — Telehealth: Payer: Self-pay | Admitting: Allergy & Immunology

## 2019-03-10 MED ORDER — CLARITHROMYCIN 500 MG PO TABS: 500.0000 mg | ORAL_TABLET | Freq: Two times a day (BID) | ORAL | 0 refills | Status: AC

## 2019-03-10 NOTE — Telephone Encounter (Signed)
Patient contacted me for an update. She reported continued sinus congestion which had improved but now worsened. Therefore I am going to send in an antibiotic to cover her for presumed secondary bacterial sinus infection.  I sent in clarithromycin BID for two weeks. Patient notified.   Salvatore Marvel, MD [facility]

## 2019-04-07 ENCOUNTER — Other Ambulatory Visit: Payer: Self-pay | Admitting: *Deleted

## 2019-04-07 DIAGNOSIS — J3089 Other allergic rhinitis: Secondary | ICD-10-CM

## 2019-04-07 MED ORDER — BREO ELLIPTA 200-25 MCG/INH IN AEPB: 1.0000 | INHALATION_SPRAY | Freq: Every day | RESPIRATORY_TRACT | 5 refills | Status: DC

## 2019-04-26 ENCOUNTER — Ambulatory Visit (INDEPENDENT_AMBULATORY_CARE_PROVIDER_SITE_OTHER): Payer: Medicare Other | Admitting: Allergy & Immunology

## 2019-04-26 ENCOUNTER — Encounter: Payer: Self-pay | Admitting: Allergy & Immunology

## 2019-04-26 ENCOUNTER — Other Ambulatory Visit: Payer: Self-pay

## 2019-04-26 VITALS — BP 120/72 | HR 98 | Resp 16 | Ht 61.4 in | Wt 130.2 lb

## 2019-04-26 DIAGNOSIS — R000 Tachycardia, unspecified: Secondary | ICD-10-CM | POA: Diagnosis not present

## 2019-04-26 DIAGNOSIS — E538 Deficiency of other specified B group vitamins: Secondary | ICD-10-CM

## 2019-04-26 DIAGNOSIS — J3089 Other allergic rhinitis: Secondary | ICD-10-CM

## 2019-04-26 DIAGNOSIS — J4550 Severe persistent asthma, uncomplicated: Secondary | ICD-10-CM | POA: Diagnosis not present

## 2019-04-26 DIAGNOSIS — J479 Bronchiectasis, uncomplicated: Secondary | ICD-10-CM

## 2019-04-26 DIAGNOSIS — D839 Common variable immunodeficiency, unspecified: Secondary | ICD-10-CM | POA: Diagnosis not present

## 2019-04-26 NOTE — Patient Instructions (Addendum)
1. Common variable immunodeficiency - We will continue with the elevated dosing of Hyqvia.  - Let me know if there is any further reactions and we can make some changes.  - We will get some routine labs today (compete metabolic panel, complete blood count, and IgG level).  2. Bronchiectasis without complication - Follow up with Dr. Camillo Flaming as needed.  3. Severe persistent asthma, uncomplicated - Lung function looked very normal today.  - Daily controller medication(s): Breo 200/20mcg one puff once daily - Prior to physical activity: ProAir 2 puffs 10-15 minutes before physical activity. - Rescue medications: ProAir 4 puffs every 4-6 hours as needed or albuterol nebulizer one vial every 4-6 hours as needed - Changes during respiratory infections or worsening symptoms: Add on Arnuity 236mcg to one inhalation twice daily for TWO WEEKS. - Asthma control goals:  * Full participation in all desired activities (may need albuterol before activity) * Albuterol use two time or less a week on average (not counting use with activity) * Cough interfering with sleep two time or less a month * Oral steroids no more than once a year * No hospitalizations  4. Chronic rhinitis - Continue with Flonase one spray per nostril daily. - Consider using nasal saline rinses prior to using the Flonase.   5. Gastroesophageal reflux disease - Continue with omeprazole at the current dosing.  6. Tachycardia  - We will refer you to Cardiology and I will let [name] know that we are doing this. - Hopefully this is nothing.   7. Return in about 3 months (around 07/27/2019) for FOLLOW UP (TELEVISIT IS FINE). This can be an in-person, a virtual Webex or a telephone follow up visit.   Please inform us of any Emergency Department visits, hospitalizations, or changes in symptoms. Call us before going to the ED for breathing or allergy symptoms since we might be able to fit you in for a sick visit. Feel free to contact us  anytime with any questions, problems, or concerns.  It was a pleasure to see you again today!  Websites that have reliable patient information: 1. American Academy of Asthma, Allergy, and Immunology: www.aaaai.org 2. Food Allergy Research and Education (FARE): foodallergy.org 3. Mothers of Asthmatics: http://www.asthmacommunitynetwork.org 4. American College of Allergy, Asthma, and Immunology: www.acaai.org  "Like" [place] on Facebook and Instagram for our latest updates!      Make sure you are registered to vote! If you have moved or changed any of your contact information, you will need to get this updated before voting!  In some cases, you MAY be able to register to vote online: [email or web address]    Voter ID laws are NOT going into effect for the General Election in November 2020! DO NOT let this stop you from exercising your right to vote!   Absentee voting is the SAFEST way to vote during the coronavirus pandemic!   Download and print an absentee ballot request form at [email or web address] or you can scan the QR code below with your smart phone:      More information on absentee ballots can be found here: [email or web address]

## 2019-04-26 NOTE — Progress Notes (Signed)
FOLLOW UP  Date of Service/Encounter:  04/26/19   Assessment:   Severe persistent asthma without complication  Common variable immunodeficiency- on HyQvia every three weeks(~762 mg/kg/month)  Anemia of chronic disease  B12 deficiency- improved on B12 supplementation  Bronchiectasis without complication- followed by Dr. Su Monks PRN  Chronic rhinitis  Gastroesophageal reflux disease- on a PPI  Chronic back pain- s/p physical therapy    Plan/Recommendations:   1. Common variable immunodeficiency - We will continue with the elevated dosing of Hyqvia.  - Let me know if there is any further reactions and we can make some changes (decrease needle size, change premedication regimen, etc). - We will get some routine labs today (compete metabolic panel, complete blood count, and IgG level).  2. Bronchiectasis without complication - Follow up with Dr. Su Monks as needed.  3. Severe persistent asthma, uncomplicated - Lung function looked very normal today.  - Daily controller medication(s): Breo 200/69mcg one puff once daily - Prior to physical activity: ProAir 2 puffs 10-15 minutes before physical activity. - Rescue medications: ProAir 4 puffs every 4-6 hours as needed or albuterol nebulizer one vial every 4-6 hours as needed - Changes during respiratory infections or worsening symptoms: Add on Arnuity to one inhalation twice daily for TWO WEEKS. - Asthma control goals:  * Full participation in all desired activities (may need albuterol before activity) * Albuterol use two time or less a week on average (not counting use with activity) * Cough interfering with sleep two time or less a month * Oral steroids no more than once a year * No hospitalizations  4. Chronic rhinitis - Continue with Flonase one spray per nostril daily. - Consider using nasal saline rinses prior to using the Flonase.   5. Gastroesophageal reflux disease - Continue with omeprazole at the  current dosing.  6. Tachycardia  - We will refer you to Cardiology and I will let Janet Garcia know that we are doing this. - Hopefully this is nothing.   7. Follow up in three weeks or earlier if needed.       Subjective:   Janet Garcia is a 73 y.o. female presenting today for follow up of  Chief Complaint  Patient presents with  . Asthma    Good  . Allergic Rhinitis     allergy season some runny nose    Janet Garcia has a history of the following: Patient Active Problem List   Diagnosis Date Noted  . B12 deficiency 05/10/2017  . Recurrent infections 02/24/2016  . 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/24/2016  . Severe persistent asthma 02/24/2016  . Chronic rhinitis 02/24/2016  . Anemia of chronic disease 02/24/2016  . Opacity of lung on imaging study 02/24/2016  . Moderate persistent asthma 09/15/2015  . Esophageal reflux 09/15/2015  . Essential hypertension 09/15/2015  . Abnormal liver enzymes 07/26/2015  . 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 Lumbosacral spondylosis 07/26/2015  . Asthma, mild persistent 07/05/2015  . Abnormal CAT scan 06/30/2015  . PNA (pneumonia) 06/21/2015  . Anxiety 05/23/2015  . HLD (hyperlipidemia) 03/24/2015  . 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 Family history of colonic polyps 01/13/2015  . Recurrent disease 11/21/2014  . Essential (primary) hypertension 08/15/2014  . Anxiety, generalized 08/15/2014  . Left lower lobe pneumonia 04/16/2014  . Arthritis, degenerative 04/16/2014  . Chronic infection of sinus 03/15/2014  . Bronchiectasis (HCC) 03/10/2014  . H/O respiratory system disease 03/10/2014  . Can't get food  down 03/01/2014  . 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 Lumbar radiculopathy 02/28/2014  . Headache, migraine 02/28/2014  . Osteopenia 02/28/2014  . Airway hyperreactivity 02/24/2014  . Closed nondisplaced fracture of  proximal phalanx of lesser toe of right foot 01/24/2014  . Allergic rhinitis 11/05/2013  . Degeneration of intervertebral disc of lumbosacral region 11/05/2013  . Esophagitis, reflux 11/05/2013    History obtained from: chart review and patient.  Janet Garcia is a 73 y.o. female presenting for a follow up visit.  We last spoke in August 2020.  At that time, she was doing fairly well with her elevated dosing of HyQvia.  She had not required antibiotics in several months.  However, she did have a viral illness and previous COVID testing have been negative.  She was slowly improving and I did offer an antibiotic if she develops worsening symptoms.  However, she never called me back to request that.  Her bronchiectasis was stable with pulmonology follow-ups on a as needed basis.  Her lung function was good with Breo 200/25 mcg 1 puff once daily.  She does have an inhaled steroid to add on during respiratory flares.  Her rhinitis was under good control with Flonase and nasal saline rinses.  She was also on omeprazole for her reflux.  Since the last visit, she has done very well.  However, she did have a recent HyQvia infusion that was rather painful.  She shows me a picture of her leg where she infused.  It was very swollen and erythematous.  This did improve over the course of 12 to 24 hours.  The infusion following this 1 was slightly better.  She talked to the home health nurse who felt this might be related to injecting into her muscle rather than the subcutaneous tissue.  Of note, she does tell me that she is using a larger needle than she was initially.  However, she has been using this larger needle for over a year without any issues.  Since that first reaction, she has had a Benadryl as a premedication regimen.  She previously needed no premedication for her infusions.  From an infectious standpoint, she has done very well.  She does not remember the last time that she needed antibiotics at all.  He has  not been in the hospital or required IV antibiotics for any infection in more than 2 years.  Asthma/Respiratory Symptom History: She only used the rescue inhaler in August when it was warmer. She remains on the Breo 200/25 mcg 1 puff once daily.  This seems to be controlling her symptoms very well.  She remains on her proton pump inhibitor.  She does continue to have a dry cough, but overall she does not seem concerned with this at all.  She has never been febrile with this cough and it is rarely if ever productive.  She has not tried using albuterol to see if it helps because it is so intermittent.Snow's asthma has been well controlled. She has not required rescue medication, experienced nocturnal awakenings due to lower respiratory symptoms, nor have activities of daily living been limited. She has required no Emergency Department or Urgent Care visits for her asthma. She has required zero courses of systemic steroids for asthma exacerbations since the last visit. ACT score today is 25, indicating asthma symptom control.   Allergic Rhinitis Symptom History: She remains on the nasal steroid as well as nasal saline rinses.  She does take a Claritin every day as well.  This regimen seems to control her symptoms without any adverse reactions.  She does not have a particularly bad time of the year and has baseline rhinitis year-round.  She is concerned with heart rate. Her pulse does go up to the 120s. She is symptomatic with it and feels very tired. She did go to the ED in September and her EKG was normal reportedlyt. In the ned of the day, they felt ythat it was anxiety with dehydration. She did not get sent to Cardiology but is interested in getting a referral to see one. She does tell me that she previously had a Cardiology workup a while ago, 10+ years or so. She does not remember the results of that workup.   Otherwise, there have been no changes to her past medical history, surgical history, family  history, or social history.    Review of Systems  Constitutional: Negative.  Negative for chills, fever, malaise/fatigue and weight loss.  HENT: Positive for congestion. Negative for ear discharge, ear pain and sore throat.   Eyes: Negative for pain, discharge and redness.  Respiratory: Positive for cough. Negative for sputum production, shortness of breath and wheezing.   Cardiovascular: Positive for palpitations. Negative for chest pain, leg swelling and PND.  Gastrointestinal: Positive for heartburn. Negative for abdominal pain, constipation, diarrhea, nausea and vomiting.  Skin: Negative.  Negative for itching and rash.  Neurological: Negative for dizziness and headaches.  Endo/Heme/Allergies: Negative for environmental allergies. Does not bruise/bleed easily.       Objective:   Blood pressure 120/72, pulse 98, resp. rate 16, height 5' 1.4" (1.56 m), weight 130 lb 3.2 oz (59.1 kg), SpO2 94 %. Body mass index is 24.28 kg/m.   Physical Exam:  Physical Exam  Constitutional: She appears well-developed.  Very pleasant female. Talkative   HENT:  Head: Normocephalic and atraumatic.  Right Ear: Tympanic membrane, external ear and ear canal normal.  Left Ear: Tympanic membrane, external ear and ear canal normal.  Nose: Mucosal edema and rhinorrhea present. No nasal deformity or septal deviation. No epistaxis. Right sinus exhibits no maxillary sinus tenderness and no frontal sinus tenderness. Left sinus exhibits no maxillary sinus tenderness and no frontal sinus tenderness.  Mouth/Throat: Uvula is midline and oropharynx is clear and moist. Mucous membranes are not pale and not dry.  Slightly erythematous posterior oropharynx.  Eyes: Pupils are equal, round, and reactive to light. Conjunctivae and EOM are normal. Right eye exhibits no chemosis and no discharge. Left eye exhibits no chemosis and no discharge. Right conjunctiva is not injected. Left conjunctiva is not injected.   Cardiovascular: Normal rate, regular rhythm and normal heart sounds.  Respiratory: Effort normal and breath sounds normal. No accessory muscle usage. No tachypnea. No respiratory distress. She has no wheezes. She has no rhonchi. She has no rales. She exhibits no tenderness.  Moving air well in all lung fields.  Lymphadenopathy:    She has cervical adenopathy.       Right cervical: No superficial cervical, no deep cervical and no posterior cervical adenopathy present.      Left cervical: No superficial cervical, no deep cervical and no posterior cervical adenopathy present.    She has no axillary adenopathy.  Neurological: She is alert.  Skin: No abrasion, no petechiae and no rash noted. Rash is not papular, not vesicular and not urticarial. No erythema. No pallor.  No eczematous or urticarial lesions noted.  Psychiatric: She has a normal mood and affect.     Diagnostic  studies:    Spirometry: results normal (FEV1: 1.80/93%, FVC: 2.03/79%, FEV1/FVC: 89%).    Spirometry consistent with normal pattern.   Allergy Studies: none       [name], MD  [facility]

## 2019-04-28 MED ORDER — BREO ELLIPTA 200-25 MCG/INH IN AEPB: 1.0000 | INHALATION_SPRAY | Freq: Every day | RESPIRATORY_TRACT | 5 refills | Status: DC

## 2019-04-30 ENCOUNTER — Ambulatory Visit (INDEPENDENT_AMBULATORY_CARE_PROVIDER_SITE_OTHER): Payer: Medicare Other | Admitting: Cardiology

## 2019-04-30 ENCOUNTER — Encounter: Payer: Self-pay | Admitting: Cardiology

## 2019-04-30 ENCOUNTER — Other Ambulatory Visit: Payer: Self-pay | Admitting: Allergy & Immunology

## 2019-04-30 ENCOUNTER — Ambulatory Visit (INDEPENDENT_AMBULATORY_CARE_PROVIDER_SITE_OTHER): Payer: Medicare Other

## 2019-04-30 ENCOUNTER — Other Ambulatory Visit: Payer: Self-pay

## 2019-04-30 VITALS — BP 110/80 | HR 89 | Ht 61.4 in | Wt 130.0 lb

## 2019-04-30 DIAGNOSIS — E782 Mixed hyperlipidemia: Secondary | ICD-10-CM

## 2019-04-30 DIAGNOSIS — R002 Palpitations: Secondary | ICD-10-CM

## 2019-04-30 DIAGNOSIS — I10 Essential (primary) hypertension: Secondary | ICD-10-CM | POA: Diagnosis not present

## 2019-04-30 NOTE — Progress Notes (Signed)
Cardiology Office Note:    Date:  04/30/2019   ID:  Janet Garcia, DOB [birth year], MRN [id number]  PCP:  [name], PA-C  Cardiologist:  No primary care provider on file.  Electrophysiologist:  None   Referring MD: [name], PA-C   Chief Complaint  Patient presents with  . Tachycardia    History of Present Illness:    Janet Garcia is a 73 y.o. female with a hx of hypertension, hyperlipidemia asthma/COPD, bronchiectasis, GERD, CDID(immunodeficiency disorder) and migraine headaches presents today to be evaluated for complications.  Patient reports that she has been experiencing intermittent palpitations for over 6 years now.  In the beginning when it started she did wear a monitor which was reported with no events of arrhythmia.  However the last several months she states that this has changed greatly as she has now had increasing episodes of her palpitations and tells me that her resting heart rate has always now been around 120.  She notes that at times without warning she feels that she has been hit with significant palpitations with her heart rate then goes up in the 160s 170s at which time she is headed.  Time she has to sit down and wait for about 10 minutes for this to resolve prior to doing any of her regular chores.  She visited the ED on April 05, 2019 her work-up was negative she was asked to follow-up with a cardiologist.  She denies any chest pain, shortness of breath, nausea, vomiting.  Past Medical History:  Diagnosis Date  . Bronchiectasis ([place])   . Immune deficiency disorder ([place])   . Pneumonia     Past Surgical History:  Procedure Laterality Date  . TOTAL HIP ARTHROPLASTY Right     Current Medications: Current Meds  Medication Sig  . albuterol (PROAIR HFA) 108 (90 Base) MCG/ACT inhaler Inhale 2 puffs into the lungs every 4 (four) hours as needed for wheezing or shortness of breath.  [name] albuterol (PROVENTIL) (2.5 MG/3ML) 0.083% nebulizer  solution 2.5 mg.  . ALPRAZolam (XANAX) 0.25 MG tablet Take 0.25 mg by mouth.  [name] amLODipine (NORVASC) 5 MG tablet Take 5 mg by mouth daily.   . Artificial Tear Solution (SYSTANE CONTACTS OP) Apply to eye.  [name] atorvastatin (LIPITOR) 20 MG tablet Take 20 mg by mouth.  . Calcium Carbonate-Vitamin D (CALCIUM 600+D) 600-200 MG-UNIT TABS Take 2,000 tablets by mouth.   . denosumab (PROLIA) 60 MG/ML SOSY injection Prolia  . EPIPEN 2-PAK 0.3 MG/0.3ML SOAJ injection Inject 0.3 mLs (0.3 mg total) into the muscle as needed for anaphylaxis.  . fluticasone furoate-vilanterol (BREO ELLIPTA) 200-25 MCG/INH AEPB Inhale 1 puff into the lungs daily.  [name] ibuprofen (ADVIL,MOTRIN) 200 MG tablet Take 200 mg by mouth.  . Immune Globulin-Hyaluronidase (HYQVIA) 30 GM/300ML KIT Inject into the skin. q 3 weeks  . loratadine (CLARITIN) 10 MG tablet Take by mouth.  . montelukast (SINGULAIR) 10 MG tablet   . Multiple Vitamin (MULTIVITAMIN) tablet Take by mouth.  [name] omeprazole (PRILOSEC) 20 MG capsule   . sertraline (ZOLOFT) 50 MG tablet Take by mouth.  . vitamin B-12 (CYANOCOBALAMIN) 1000 MCG tablet Take by mouth.     Allergies:   Guaifenesin, Guaifenesin er, Levofloxacin, Codeine, Erythromycin, Sulfa antibiotics, and Sulfur   Social History   Socioeconomic History  . Marital status: Married    Spouse name: Not on file  . Number of children: Not on file  . Years of education: Not on file  .  Highest education level: Not on file  Occupational History  . Not on file  Social Needs  . Financial resource strain: Not on file  . Food insecurity    Worry: Not on file    Inability: Not on file  . Transportation needs    Medical: Not on file    Non-medical: Not on file  Tobacco Use  . Smoking status: Never Smoker  . Smokeless tobacco: Never Used  Substance and Sexual Activity  . Alcohol use: No  . Drug use: No  . Sexual activity: Yes  Lifestyle  . Physical activity    Days per week: Not on file    Minutes per  session: Not on file  . Stress: Not on file  Relationships  . Social Herbalist on phone: Not on file    Gets together: Not on file    Attends religious service: Not on file    Active member of club or organization: Not on file    Attends meetings of clubs or organizations: Not on file    Relationship status: Not on file  Other Topics Concern  . Not on file  Social History Narrative  . Not on file     Family History: The patient's family history includes Hypertension in her mother. There is no history of Allergic rhinitis, Angioedema, Asthma, Eczema, Immunodeficiency, or Urticaria.  ROS:   Review of Systems  Constitution: Negative for decreased appetite, fever and weight gain.  HENT: Negative for congestion, ear discharge, hoarse voice and sore throat.   Eyes: Negative for discharge, redness, vision loss in right eye and visual halos.  Cardiovascular: Negative for chest pain, dyspnea on exertion, leg swelling, orthopnea and palpitations.  Respiratory: Negative for cough, hemoptysis, shortness of breath and snoring.   Endocrine: Negative for heat intolerance and polyphagia.  Hematologic/Lymphatic: Negative for bleeding problem. Does not bruise/bleed easily.  Skin: Negative for flushing, nail changes, rash and suspicious lesions.  Musculoskeletal: Negative for arthritis, joint pain, muscle cramps, myalgias, neck pain and stiffness.  Gastrointestinal: Negative for abdominal pain, bowel incontinence, diarrhea and excessive appetite.  Genitourinary: Negative for decreased libido, genital sores and incomplete emptying.  Neurological: Negative for brief paralysis, focal weakness, headaches and loss of balance.  Psychiatric/Behavioral: Negative for altered mental status, depression and suicidal ideas.  Allergic/Immunologic: Negative for HIV exposure and persistent infections.    EKGs/Labs/Other Studies Reviewed:    The following studies were reviewed today:   EKG:  The ekg  ordered today demonstrates sinus rhythm, heart rate 89 bpm left axis deviation with low voltage prior EKG to compare.  Long-term ambulatory monitoring 2017. Indication: palpitations  Baseline Rhythm: Sinus Rhythm   Rhythm Findings:   1. Ventricular: rare PVCs   2. Supraventricular: rare PACs   3. Bradyarrhythmias and Pauses: No   Symptoms reported: None   CONCLUSIONS:  1. Normal event monitor.   2. Arrhythmia as described  3. Symptoms were not reported   4. Symptoms were not correlated with arrhythmias     Recent Labs: 10/20/2018: ALT 19; BUN 11; Creatinine, Ser 0.68; Hemoglobin 12.7; Platelets 238; Potassium 4.0; Sodium 146  Recent Lipid Panel No results found for: CHOL, TRIG, HDL, CHOLHDL, VLDL, LDLCALC, LDLDIRECT  Physical Exam:    VS:  BP 110/80 (BP Location: Right Arm, Patient Position: Sitting, Cuff Size: Normal)   Pulse 89   Ht 5' 1.4" (1.56 m)   Wt 130 lb (59 kg)   SpO2 97%   BMI 24.24 kg/m  Wt Readings from Last 3 Encounters:  04/30/19 130 lb (59 kg)  04/26/19 130 lb 3.2 oz (59.1 kg)  11/21/17 127 lb 3.3 oz (57.7 kg)     GEN: Very pleasant female, appears younger than stated age. well nourished, well developed in no acute distress HEENT: Normal NECK: No JVD; No carotid bruits LYMPHATICS: No lymphadenopathy CARDIAC: S1S2 noted,RRR, no murmurs, rubs, gallops RESPIRATORY:  Clear to auscultation without rales, wheezing or rhonchi  ABDOMEN: Soft, non-tender, non-distended, +bowel sounds, no guarding. EXTREMITIES: No edema, No cyanosis, no clubbing MUSCULOSKELETAL:  No edema; No deformity  SKIN: Warm and dry NEUROLOGIC:  Alert and oriented x 3, non-focal PSYCHIATRIC:  Normal affect, good insight  ASSESSMENT:    1. Essential (primary) hypertension   2. Palpitations   3. Mixed hyperlipidemia    PLAN:    1.  Her symptoms are now more intense in slightly different from 2017.  She does have pulmonary disease which can dispose her  to atrial arrhythmias therefore I would like to rule out a cardiovascular etiology of this palpitation, therefore at this time I would like to placed a zio patch for 3  days.   In additon a transthoracic echocardiogram will be ordered to assess LV/RV function and any structural abnormalities. Once these testing have been performed amd reviewed further reccomendations will be made. For now, I do reccomend that the patient goes to the nearest ED if  symptoms recur.  2.  Her blood pressure appears to be controlled on her amlodipine 5 mg daily.  We will continue this for now  3.  She will stay on her atorvastatin 20 mg daily; lipid profile performed on January 07, 2019 total cholesterol 92, HDL 85, LDL 91, with rate 82.  4.  Blood work today for [employer].  The patient is in agreement with the above plan. The patient left the office in stable condition.  The patient will follow up in 2 months or sooner if needed.   Medication Adjustments/Labs and Tests Ordered: Current medicines are reviewed at length with the patient today.  Concerns regarding medicines are outlined above.  Orders Placed This Encounter  Procedures  . TSH  . LONG TERM MONITOR (3-14 DAYS)  . EKG 12-Lead  . ECHOCARDIOGRAM COMPLETE   No orders of the defined types were placed in this encounter.   Patient Instructions  Medication Instructions:  Your physician recommends that you continue on your current medications as directed. Please refer to the Current Medication list given to you today.  If you need a refill on your cardiac medications before your next appointment, please call your pharmacy.   Lab work: Your physician recommends that you return for lab work today: TSH   If you have labs (blood work) drawn today and your tests are completely normal, you will receive your results only by: [name] MyChart Message (if you have MyChart) OR . A paper copy in the mail If you have any lab test that is abnormal or we need to change your  treatment, we will call you to review the results.  Testing/Procedures: Your physician has requested that you have an echocardiogram. Echocardiography is a painless test that uses sound waves to create images of your heart. It provides your doctor with information about the size and shape of your heart and how well your heart's chambers and valves are working. This procedure takes approximately one hour. There are no restrictions for this procedure.  Your physician has recommended that you wear a holter  monitor. Holter monitors are medical devices that record the heart's electrical activity. Doctors most often use these monitors to diagnose arrhythmias. Arrhythmias are problems with the speed or rhythm of the heartbeat. The monitor is a small, portable device. You can wear one while you do your normal daily activities. This is usually used to diagnose what is causing palpitations/syncope (passing out). Wear for 7 days.     Follow-Up: . Follow up in 2 months   Any Other Special Instructions Will Be Listed Below (If Applicable).   Echocardiogram An echocardiogram is a procedure that uses painless sound waves (ultrasound) to produce an image of the heart. Images from an echocardiogram can provide important information about:  Signs of coronary artery disease (CAD).  Aneurysm detection. An aneurysm is a weak or damaged part of an artery wall that bulges out from the normal force of blood pumping through the body.  Heart size and shape. Changes in the size or shape of the heart can be associated with certain conditions, including heart failure, aneurysm, and CAD.  Heart muscle function.  Heart valve function.  Signs of a past heart attack.  Fluid buildup around the heart.  Thickening of the heart muscle.  A tumor or infectious growth around the heart valves. Tell a health care provider about:  Any allergies you have.  All medicines you are taking, including vitamins, herbs, eye  drops, creams, and over-the-counter medicines.  Any blood disorders you have.  Any surgeries you have had.  Any medical conditions you have.  Whether you are pregnant or may be pregnant. What are the risks? Generally, this is a safe procedure. However, problems may occur, including:  Allergic reaction to dye (contrast) that may be used during the procedure. What happens before the procedure? No specific preparation is needed. You may eat and drink normally. What happens during the procedure?   An IV tube may be inserted into one of your veins.  You may receive contrast through this tube. A contrast is an injection that improves the quality of the pictures from your heart.  A gel will be applied to your chest.  A wand-like tool (transducer) will be moved over your chest. The gel will help to transmit the sound waves from the transducer.  The sound waves will harmlessly bounce off of your heart to allow the heart images to be captured in real-time motion. The images will be recorded on a computer. The procedure may vary among health care providers and hospitals. What happens after the procedure?  You may return to your normal, everyday life, including diet, activities, and medicines, unless your health care provider tells you not to do that. Summary  An echocardiogram is a procedure that uses painless sound waves (ultrasound) to produce an image of the heart.  Images from an echocardiogram can provide important information about the size and shape of your heart, heart muscle function, heart valve function, and fluid buildup around your heart.  You do not need to do anything to prepare before this procedure. You may eat and drink normally.  After the echocardiogram is completed, you may return to your normal, everyday life, unless your health care provider tells you not to do that. This information is not intended to replace advice given to you by your health care provider. Make  sure you discuss any questions you have with your health care provider. Document Released: 07/05/2000 Document Revised: 10/29/2018 Document Reviewed: 08/10/2016 Elsevier Patient Education  2020 [employer].  Adopting a Healthy Lifestyle.  Know what a healthy weight is for you (roughly BMI <25) and aim to maintain this   Aim for 7+ servings of fruits and vegetables daily   65-80+ fluid ounces of water or unsweet tea for healthy kidneys   Limit to max 1 drink of alcohol per day; avoid smoking/tobacco   Limit animal fats in diet for cholesterol and heart health - choose grass fed whenever available   Avoid highly processed foods, and foods high in saturated/trans fats   Aim for low stress - take time to unwind and care for your mental health   Aim for 150 min of moderate intensity exercise weekly for heart health, and weights twice weekly for bone health   Aim for 7-9 hours of sleep daily   When it comes to diets, agreement about the perfect plan isnt easy to find, even among the experts. Experts at the [employer] developed an idea known as the Healthy Eating Plate. Just imagine a plate divided into logical, healthy portions.   The emphasis is on diet quality:   Load up on vegetables and fruits - one-half of your plate: Aim for color and variety, and remember that potatoes dont count.   Go for whole grains - one-quarter of your plate: Whole wheat, barley, wheat berries, quinoa, oats, brown rice, and foods made with them. If you want pasta, go with whole wheat pasta.   Protein power - one-quarter of your plate: Fish, chicken, beans, and nuts are all healthy, versatile protein sources. Limit red meat.   The diet, however, does go beyond the plate, offering a few other suggestions.   Use healthy plant oils, such as olive, canola, soy, corn, sunflower and peanut. Check the labels, and avoid partially hydrogenated oil, which have unhealthy trans fats.    If youre thirsty, drink water. Coffee and tea are good in moderation, but skip sugary drinks and limit milk and dairy products to one or two daily servings.   The type of carbohydrate in the diet is more important than the amount. Some sources of carbohydrates, such as vegetables, fruits, whole grains, and beans-are healthier than others.   Finally, stay active  Signed, Berniece Salines, DO  04/30/2019 2:18 PM    [facility]

## 2019-04-30 NOTE — Patient Instructions (Signed)
Medication Instructions:  Your physician recommends that you continue on your current medications as directed. Please refer to the Current Medication list given to you today.  If you need a refill on your cardiac medications before your next appointment, please call your pharmacy.   Lab work: Your physician recommends that you return for lab work today: TSH   If you have labs (blood work) drawn today and your tests are completely normal, you will receive your results only by: [name] MyChart Message (if you have MyChart) OR . A paper copy in the mail If you have any lab test that is abnormal or we need to change your treatment, we will call you to review the results.  Testing/Procedures: Your physician has requested that you have an echocardiogram. Echocardiography is a painless test that uses sound waves to create images of your heart. It provides your doctor with information about the size and shape of your heart and how well your heart's chambers and valves are working. This procedure takes approximately one hour. There are no restrictions for this procedure.  Your physician has recommended that you wear a holter monitor. Holter monitors are medical devices that record the heart's electrical activity. Doctors most often use these monitors to diagnose arrhythmias. Arrhythmias are problems with the speed or rhythm of the heartbeat. The monitor is a small, portable device. You can wear one while you do your normal daily activities. This is usually used to diagnose what is causing palpitations/syncope (passing out). Wear for 7 days.     Follow-Up: . Follow up in 2 months   Any Other Special Instructions Will Be Listed Below (If Applicable).   Echocardiogram An echocardiogram is a procedure that uses painless sound waves (ultrasound) to produce an image of the heart. Images from an echocardiogram can provide important information about:  Signs of coronary artery disease (CAD).  Aneurysm  detection. An aneurysm is a weak or damaged part of an artery wall that bulges out from the normal force of blood pumping through the body.  Heart size and shape. Changes in the size or shape of the heart can be associated with certain conditions, including heart failure, aneurysm, and CAD.  Heart muscle function.  Heart valve function.  Signs of a past heart attack.  Fluid buildup around the heart.  Thickening of the heart muscle.  A tumor or infectious growth around the heart valves. Tell a health care provider about:  Any allergies you have.  All medicines you are taking, including vitamins, herbs, eye drops, creams, and over-the-counter medicines.  Any blood disorders you have.  Any surgeries you have had.  Any medical conditions you have.  Whether you are pregnant or may be pregnant. What are the risks? Generally, this is a safe procedure. However, problems may occur, including:  Allergic reaction to dye (contrast) that may be used during the procedure. What happens before the procedure? No specific preparation is needed. You may eat and drink normally. What happens during the procedure?   An IV tube may be inserted into one of your veins.  You may receive contrast through this tube. A contrast is an injection that improves the quality of the pictures from your heart.  A gel will be applied to your chest.  A wand-like tool (transducer) will be moved over your chest. The gel will help to transmit the sound waves from the transducer.  The sound waves will harmlessly bounce off of your heart to allow the heart images to be  captured in real-time motion. The images will be recorded on a computer. The procedure may vary among health care providers and hospitals. What happens after the procedure?  You may return to your normal, everyday life, including diet, activities, and medicines, unless your health care provider tells you not to do that. Summary  An  echocardiogram is a procedure that uses painless sound waves (ultrasound) to produce an image of the heart.  Images from an echocardiogram can provide important information about the size and shape of your heart, heart muscle function, heart valve function, and fluid buildup around your heart.  You do not need to do anything to prepare before this procedure. You may eat and drink normally.  After the echocardiogram is completed, you may return to your normal, everyday life, unless your health care provider tells you not to do that. This information is not intended to replace advice given to you by your health care provider. Make sure you discuss any questions you have with your health care provider. Document Released: 07/05/2000 Document Revised: 10/29/2018 Document Reviewed: 08/10/2016 Elsevier Patient Education  2020 [employer].

## 2019-05-01 ENCOUNTER — Encounter: Payer: Self-pay | Admitting: Cardiology

## 2019-05-01 LAB — COMPREHENSIVE METABOLIC PANEL
ALT: 23 IU/L (ref 0–32)
AST: 31 IU/L (ref 0–40)
Albumin/Globulin Ratio: 1.4 (ref 1.2–2.2)
Albumin: 4.2 g/dL (ref 3.7–4.7)
Alkaline Phosphatase: 87 IU/L (ref 39–117)
BUN/Creatinine Ratio: 13 (ref 12–28)
BUN: 10 mg/dL (ref 8–27)
Bilirubin Total: 0.2 mg/dL (ref 0.0–1.2)
CO2: 25 mmol/L (ref 20–29)
Calcium: 9.5 mg/dL (ref 8.7–10.3)
Chloride: 101 mmol/L (ref 96–106)
Creatinine, Ser: 0.8 mg/dL (ref 0.57–1.00)
GFR calc Af Amer: 85 mL/min/{1.73_m2} (ref >59)
GFR calc non Af Amer: 74 mL/min/{1.73_m2} (ref >59)
Globulin, Total: 2.9 g/dL (ref 1.5–4.5)
Glucose: 87 mg/dL (ref 65–99)
Potassium: 3.8 mmol/L (ref 3.5–5.2)
Sodium: 142 mmol/L (ref 134–144)
Total Protein: 7.1 g/dL (ref 6.0–8.5)

## 2019-05-01 LAB — CBC WITH DIFFERENTIAL
Basophils Absolute: 0 10*3/uL (ref 0.0–0.2)
Basos: 1 %
EOS (ABSOLUTE): 0.1 10*3/uL (ref 0.0–0.4)
Eos: 1 %
Hematocrit: 37.2 % (ref 34.0–46.6)
Hemoglobin: 12.7 g/dL (ref 11.1–15.9)
Immature Grans (Abs): 0 10*3/uL (ref 0.0–0.1)
Immature Granulocytes: 0 %
Lymphocytes Absolute: 1.8 10*3/uL (ref 0.7–3.1)
Lymphs: 29 %
MCH: 33.3 pg — ABNORMAL HIGH (ref 26.6–33.0)
MCHC: 34.1 g/dL (ref 31.5–35.7)
MCV: 98 fL — ABNORMAL HIGH (ref 79–97)
Monocytes Absolute: 0.5 10*3/uL (ref 0.1–0.9)
Monocytes: 7 %
Neutrophils Absolute: 3.9 10*3/uL (ref 1.4–7.0)
Neutrophils: 62 %
RBC: 3.81 x10E6/uL (ref 3.77–5.28)
RDW: 13.1 % (ref 11.7–15.4)
WBC: 6.2 10*3/uL (ref 3.4–10.8)

## 2019-05-01 LAB — IGG, IGA, IGM
IgA/Immunoglobulin A, Serum: 391 mg/dL (ref 64–422)
IgG (Immunoglobin G), Serum: 1343 mg/dL (ref 586–1602)
IgM (Immunoglobulin M), Srm: 50 mg/dL (ref 26–217)

## 2019-05-01 LAB — TSH: TSH: 1.35 u[IU]/mL (ref 0.450–4.500)

## 2019-05-03 DIAGNOSIS — I10 Essential (primary) hypertension: Secondary | ICD-10-CM

## 2019-05-03 DIAGNOSIS — R002 Palpitations: Secondary | ICD-10-CM

## 2019-05-10 ENCOUNTER — Ambulatory Visit (HOSPITAL_BASED_OUTPATIENT_CLINIC_OR_DEPARTMENT_OTHER)
Admission: RE | Admit: 2019-05-10 | Discharge: 2019-05-10 | Disposition: A | Discharge status: Home / Self Care | Payer: Medicare Other | Source: Ambulatory Visit | Attending: Cardiology | Admitting: Cardiology

## 2019-05-10 DIAGNOSIS — I10 Essential (primary) hypertension: Secondary | ICD-10-CM | POA: Diagnosis present

## 2019-05-10 DIAGNOSIS — R002 Palpitations: Secondary | ICD-10-CM | POA: Insufficient documentation

## 2019-05-10 NOTE — Progress Notes (Signed)
  Echocardiogram 2D Echocardiogram has been performed.  Janet Garcia 05/10/2019, 10:46 AM

## 2019-05-17 ENCOUNTER — Telehealth: Payer: Self-pay | Admitting: *Deleted

## 2019-05-17 NOTE — Telephone Encounter (Signed)
Telephone call to patient. Left message to make appointment with Dr Harriet Masson to discuss monitor results.

## 2019-05-17 NOTE — Telephone Encounter (Signed)
-----   Message from Berniece Salines, DO sent at 05/15/2019  9:55 PM EDT ----- Please have patient schedule with me in the next two weeks to discuss her monitor results.

## 2019-05-24 ENCOUNTER — Ambulatory Visit: Payer: Medicare Other | Admitting: Cardiology

## 2019-06-01 ENCOUNTER — Other Ambulatory Visit: Payer: Self-pay

## 2019-06-01 ENCOUNTER — Encounter: Payer: Self-pay | Admitting: Cardiology

## 2019-06-01 ENCOUNTER — Ambulatory Visit (INDEPENDENT_AMBULATORY_CARE_PROVIDER_SITE_OTHER): Payer: Medicare Other | Admitting: Cardiology

## 2019-06-01 VITALS — BP 120/72 | HR 99 | Ht 61.4 in | Wt 128.0 lb

## 2019-06-01 DIAGNOSIS — I493 Ventricular premature depolarization: Secondary | ICD-10-CM

## 2019-06-01 DIAGNOSIS — I10 Essential (primary) hypertension: Secondary | ICD-10-CM

## 2019-06-01 DIAGNOSIS — E782 Mixed hyperlipidemia: Secondary | ICD-10-CM

## 2019-06-01 MED ORDER — DILTIAZEM HCL ER COATED BEADS 120 MG PO CP24: 120.0000 mg | ORAL_CAPSULE | Freq: Every day | ORAL | 1 refills | Status: DC

## 2019-06-01 NOTE — Progress Notes (Signed)
Cardiology Office Note:    Date:  06/01/2019   ID:  Janet Garcia, DOB 1945-10-07, MRN [id number]  PCP:  [name], PA-C  Cardiologist:  Berniece Salines, DO  Electrophysiologist:  None   Referring MD: [name], PA-C   Chief Complaint  Patient presents with  . Follow-up    History of Present Illness:    Janet Garcia is a 73 y.o. female with a hx of female with a hx of hypertension, hyperlipidemia asthma/COPD, bronchiectasis, GERD, CDID(immunodeficiency disorder) and migraine headaches.  I initially saw the patient April 30, 2019 at which time she presented to be evaluated for palpitations.  At that time I recommended that she wear a ZIO monitor as well as a transthoracic echocardiogram.  There are no changes made to her blood pressure medication at that time.  In the interim the patient was able to get her testing done. She is still experiencing palpitations. No other complaints at this time.   Past Medical History:  Diagnosis Date  . Bronchiectasis ([place])   . Immune deficiency disorder ([place])   . Pneumonia     Past Surgical History:  Procedure Laterality Date  . TOTAL HIP ARTHROPLASTY Right     Current Medications: Current Meds  Medication Sig  . albuterol (PROAIR HFA) 108 (90 Base) MCG/ACT inhaler Inhale 2 puffs into the lungs every 4 (four) hours as needed for wheezing or shortness of breath.  [name] albuterol (PROVENTIL) (2.5 MG/3ML) 0.083% nebulizer solution 2.5 mg.  . ALPRAZolam (XANAX) 0.25 MG tablet Take 0.25 mg by mouth.  . Artificial Tear Solution (SYSTANE CONTACTS OP) Apply to eye.  [name] atorvastatin (LIPITOR) 20 MG tablet Take 20 mg by mouth.  . Calcium Carbonate-Vitamin D (CALCIUM 600+D) 600-200 MG-UNIT TABS Take 2,000 tablets by mouth.   . denosumab (PROLIA) 60 MG/ML SOSY injection Prolia  . EPIPEN 2-PAK 0.3 MG/0.3ML SOAJ injection Inject 0.3 mLs (0.3 mg total) into the muscle as needed for anaphylaxis.  . fluticasone furoate-vilanterol (BREO ELLIPTA)  200-25 MCG/INH AEPB Inhale 1 puff into the lungs daily.  [name] ibuprofen (ADVIL,MOTRIN) 200 MG tablet Take 200 mg by mouth.  . Immune Globulin-Hyaluronidase (HYQVIA) 30 GM/300ML KIT Inject into the skin. q 3 weeks  . loratadine (CLARITIN) 10 MG tablet Take by mouth.  . montelukast (SINGULAIR) 10 MG tablet   . Multiple Vitamin (MULTIVITAMIN) tablet Take by mouth.  [name] omeprazole (PRILOSEC) 20 MG capsule   . sertraline (ZOLOFT) 50 MG tablet Take by mouth.  . vitamin B-12 (CYANOCOBALAMIN) 1000 MCG tablet Take by mouth.  . [DISCONTINUED] amLODipine (NORVASC) 5 MG tablet Take 5 mg by mouth daily.      Allergies:   Guaifenesin, Guaifenesin er, Levofloxacin, Codeine, Erythromycin, Sulfa antibiotics, and Sulfur   Social History   Socioeconomic History  . Marital status: Married    Spouse name: Not on file  . Number of children: Not on file  . Years of education: Not on file  . Highest education level: Not on file  Occupational History  . Not on file  Social Needs  . Financial resource strain: Not on file  . Food insecurity    Worry: Not on file    Inability: Not on file  . Transportation needs    Medical: Not on file    Non-medical: Not on file  Tobacco Use  . Smoking status: Never Smoker  . Smokeless tobacco: Never Used  Substance and Sexual Activity  . Alcohol use: No  . Drug use: No  .  Sexual activity: Yes  Lifestyle  . Physical activity    Days per week: Not on file    Minutes per session: Not on file  . Stress: Not on file  Relationships  . Social Herbalist on phone: Not on file    Gets together: Not on file    Attends religious service: Not on file    Active member of club or organization: Not on file    Attends meetings of clubs or organizations: Not on file    Relationship status: Not on file  Other Topics Concern  . Not on file  Social History Narrative  . Not on file     Family History: The patient's family history includes Hypertension in her mother.  There is no history of Allergic rhinitis, Angioedema, Asthma, Eczema, Immunodeficiency, or Urticaria.  ROS:   Review of Systems  Constitution: Negative for decreased appetite, fever and weight gain.  HENT: Negative for congestion, ear discharge, hoarse voice and sore throat.   Eyes: Negative for discharge, redness, vision loss in right eye and visual halos.  Cardiovascular: Reports palpitation. Negative for chest pain, dyspnea on exertion, leg swelling, orthopnea  Respiratory: Negative for cough, hemoptysis, shortness of breath and snoring.   Endocrine: Negative for heat intolerance and polyphagia.  Hematologic/Lymphatic: Negative for bleeding problem. Does not bruise/bleed easily.  Skin: Negative for flushing, nail changes, rash and suspicious lesions.  Musculoskeletal: Negative for arthritis, joint pain, muscle cramps, myalgias, neck pain and stiffness.  Gastrointestinal: Negative for abdominal pain, bowel incontinence, diarrhea and excessive appetite.  Genitourinary: Negative for decreased libido, genital sores and incomplete emptying.  Neurological: Negative for brief paralysis, focal weakness, headaches and loss of balance.  Psychiatric/Behavioral: Negative for altered mental status, depression and suicidal ideas.  Allergic/Immunologic: Negative for HIV exposure and persistent infections.    EKGs/Labs/Other Studies Reviewed:    The following studies were reviewed today:   EKG:  None today  TTE Impressions 06/10/2019  1. Left ventricular ejection fraction, by visual estimation, is 60 to 65%. The left ventricle has normal function. Normal left ventricular size. There is no left ventricular hypertrophy.  2. Left ventricular diastolic Doppler parameters are consistent with impaired relaxation pattern of LV diastolic filling.  3. Global right ventricle has normal systolic function.The right ventricular size is normal. No increase in right ventricular wall thickness.  4. Left atrial  size was normal.  5. Right atrial size was normal.  6. Mild mitral annular calcification.  7. The mitral valve is degenerative. No evidence of mitral valve regurgitation. No evidence of mitral stenosis.  8. The tricuspid valve is normal in structure. Tricuspid valve regurgitation is trivial.  9. The aortic valve is normal in structure. Aortic valve regurgitation was not visualized by color flow Doppler. Structurally normal aortic valve, with no evidence of sclerosis or stenosis. 10. The pulmonic valve was normal in structure. Pulmonic valve regurgitation is not visualized by color flow Doppler. 11. Normal pulmonary artery systolic pressure. 12. The inferior vena cava is normal in size with greater than 50% respiratory variability, suggesting right atrial pressure of 3 mmHg.   The patient wore the ZIO monitor for 3 days, starting April 30, 2019. Indication: Palpitations The minimum heart rate was 69 bpm, the maximum heart rate was 149 bpm with the average heart rate at 94 bpm. Predominant underlying rhythm was Sinus Rhythm. Premature atrial complexes were rare (<1.0%). Premature ventricular complexes were occasional (1.5%, 5889).  Ventricular couplet was rare less than 1%.  Ventricular Trigeminy was present with duration 6.4 seconds. There is no ventricular tachycardia, no pauses, no AV block, and no atrial fibrillation. Patient triggered events: 2 associated with sinus rhythm and 3 associated with premature ventricular complexes. Conclusion: There is evidence of symptomatic premature ventricular complex.  Recent Labs: 10/20/2018: Platelets 238 04/30/2019: ALT 23; BUN 10; Creatinine, Ser 0.80; Hemoglobin 12.7; Potassium 3.8; Sodium 142; TSH 1.350  Recent Lipid Panel No results found for: CHOL, TRIG, HDL, CHOLHDL, VLDL, LDLCALC, LDLDIRECT  Physical Exam:    VS:  BP 120/72 (BP Location: Left Arm, Patient Position: Sitting, Cuff Size: Normal)   Pulse 99   Ht 5' 1.4" (1.56 m)   Wt 128 lb  (58.1 kg)   SpO2 94%   BMI 23.87 kg/m     Wt Readings from Last 3 Encounters:  06/01/19 128 lb (58.1 kg)  04/30/19 130 lb (59 kg)  04/26/19 130 lb 3.2 oz (59.1 kg)     GEN: Well nourished, well developed in no acute distress HEENT: Normal NECK: No JVD; No carotid bruits LYMPHATICS: No lymphadenopathy CARDIAC: S1S2 noted,RRR, no murmurs, rubs, gallops RESPIRATORY:  Clear to auscultation without rales, wheezing or rhonchi  ABDOMEN: Soft, non-tender, non-distended, +bowel sounds, no guarding. EXTREMITIES: No edema, No cyanosis, no clubbing MUSCULOSKELETAL:  No edema; No deformity  SKIN: Warm and dry NEUROLOGIC:  Alert and oriented x 3, non-focal PSYCHIATRIC:  Normal affect, good insight  ASSESSMENT:    1. Symptomatic PVCs   2. Essential (primary) hypertension   3. Mixed hyperlipidemia    PLAN:    1.  At the time, I will switch the patient from amlodipine 5 mg daily, Cardizem 120 mg CD.  Hoping that this will help her blood pressure as well as control her palpitations.  2.  Hyperlipidemia, continue patient on atorvastatin 20 g daily.  The patient is in agreement with the above plan. The patient left the office in stable condition.  The patient will follow up in 2 months  Medication Adjustments/Labs and Tests Ordered: Current medicines are reviewed at length with the patient today.  Concerns regarding medicines are outlined above.  No orders of the defined types were placed in this encounter.  Meds ordered this encounter  Medications  . diltiazem (CARDIZEM CD) 120 MG 24 hr capsule    Sig: Take 1 capsule (120 mg total) by mouth daily.    Dispense:  90 capsule    Refill:  1    Patient Instructions  Medication Instructions:  Your physician has recommended you make the following change in your medication:   STOP: AMlodipine  START: Diltiazem CD(Cardizem CD) 120 mg Take 1 tab daily  *If you need a refill on your cardiac medications before your next appointment, please  call your pharmacy*  Lab Work: None If you have labs (blood work) drawn today and your tests are completely normal, you will receive your results only by: [name] MyChart Message (if you have MyChart) OR . A paper copy in the mail If you have any lab test that is abnormal or we need to change your treatment, we will call you to review the results.  Testing/Procedures: NOne  Follow-Up: At [facility], you and your health needs are our priority.  As part of our continuing mission to provide you with exceptional heart care, we have created designated Provider Care Teams.  These Care Teams include your primary Cardiologist (physician) and Advanced Practice Providers (APPs -  Physician Assistants and Nurse Practitioners) who all work together  to provide you with the care you need, when you need it.  Your next appointment:   2 month   The format for your next appointment:   In Person  Provider:   Berniece Salines, DO  Other Instructions Diltiazem extended-release capsules or tablets What is this medicine? DILTIAZEM (dil TYE a zem) is a calcium-channel blocker. It affects the amount of calcium found in your heart and muscle cells. This relaxes your blood vessels, which can reduce the amount of work the heart has to do. This medicine is used to treat high blood pressure and chest pain caused by angina. This medicine may be used for other purposes; ask your health care provider or pharmacist if you have questions. COMMON BRAND NAME(S): Cardizem CD, Cardizem LA, Cardizem SR, Cartia XT, Dilacor XR, Dilt-CD, Diltia XT, Diltzac, Matzim LA, Rema Fendt, TIADYLT ER, Tiamate, Tiazac What should I tell my health care provider before I take this medicine? They need to know if you have any of these conditions:  heart problems, low blood pressure, irregular heartbeat  liver disease  previous heart attack  an unusual or allergic reaction to diltiazem, other medicines, foods, dyes, or preservatives   pregnant or trying to get pregnant  breast-feeding How should I use this medicine? Take this medicine by mouth with a glass of water. Follow the directions on the prescription label. Swallow whole, do not crush or chew. Ask your doctor or pharmacist if your should take this medicine with food. Take your doses at regular intervals. Do not take your medicine more often then directed. Do not stop taking except on the advice of your doctor or health care professional. Ask your doctor or health care professional how to gradually reduce the dose. Talk to your pediatrician regarding the use of this medicine in children. Special care may be needed. Overdosage: If you think you have taken too much of this medicine contact a poison control center or emergency room at once. NOTE: This medicine is only for you. Do not share this medicine with others. What if I miss a dose? If you miss a dose, take it as soon as you can. If it is almost time for your next dose, take only that dose. Do not take double or extra doses. What may interact with this medicine? Do not take this medicine with any of the following medications:  cisapride  hawthorn  pimozide  ranolazine  red yeast rice This medicine may also interact with the following medications:  buspirone  carbamazepine  cimetidine  cyclosporine  digoxin  local anesthetics or general anesthetics  lovastatin  medicines for anxiety or difficulty sleeping like midazolam and triazolam  medicines for high blood pressure or heart problems  quinidine  rifampin, rifabutin, or rifapentine This list may not describe all possible interactions. Give your health care provider a list of all the medicines, herbs, non-prescription drugs, or dietary supplements you use. Also tell them if you smoke, drink alcohol, or use illegal drugs. Some items may interact with your medicine. What should I watch for while using this medicine? Check your blood pressure  and pulse rate regularly. Ask your doctor or health care professional what your blood pressure and pulse rate should be and when you should contact him or her. You may feel dizzy or lightheaded. Do not drive, use machinery, or do anything that needs mental alertness until you know how this medicine affects you. To reduce the risk of dizzy or fainting spells, do not sit or stand  up quickly, especially if you are an older patient. Alcohol can make you more dizzy or increase flushing and rapid heartbeats. Avoid alcoholic drinks. What side effects may I notice from receiving this medicine? Side effects that you should report to your doctor or health care professional as soon as possible:  allergic reactions like skin rash, itching or hives, swelling of the face, lips, or tongue  confusion, mental depression  feeling faint or lightheaded, falls  redness, blistering, peeling or loosening of the skin, including inside the mouth  slow, irregular heartbeat  swelling of the feet and ankles  unusual bleeding or bruising, pinpoint red spots on the skin Side effects that usually do not require medical attention (report to your doctor or health care professional if they continue or are bothersome):  constipation or diarrhea  difficulty sleeping  facial flushing  headache  nausea, vomiting  sexual dysfunction  weak or tired This list may not describe all possible side effects. Call your doctor for medical advice about side effects. You may report side effects to FDA at 1-800-FDA-1088. Where should I keep my medicine? Keep out of the reach of children. Store at room temperature between 15 and 30 degrees C (59 and 86 degrees F). Protect from humidity. Throw away any unused medicine after the expiration date. NOTE: This sheet is a summary. It may not cover all possible information. If you have questions about this medicine, talk to your doctor, pharmacist, or health care provider.  2020  Elsevier/Gold Standard (2007-10-29 14:35:47)      Adopting a Healthy Lifestyle.  Know what a healthy weight is for you (roughly BMI <25) and aim to maintain this   Aim for 7+ servings of fruits and vegetables daily   65-80+ fluid ounces of water or unsweet tea for healthy kidneys   Limit to max 1 drink of alcohol per day; avoid smoking/tobacco   Limit animal fats in diet for cholesterol and heart health - choose grass fed whenever available   Avoid highly processed foods, and foods high in saturated/trans fats   Aim for low stress - take time to unwind and care for your mental health   Aim for 150 min of moderate intensity exercise weekly for heart health, and weights twice weekly for bone health   Aim for 7-9 hours of sleep daily   When it comes to diets, agreement about the perfect plan isnt easy to find, even among the experts. Experts at the [employer] developed an idea known as the Healthy Eating Plate. Just imagine a plate divided into logical, healthy portions.   The emphasis is on diet quality:   Load up on vegetables and fruits - one-half of your plate: Aim for color and variety, and remember that potatoes dont count.   Go for whole grains - one-quarter of your plate: Whole wheat, barley, wheat berries, quinoa, oats, brown rice, and foods made with them. If you want pasta, go with whole wheat pasta.   Protein power - one-quarter of your plate: Fish, chicken, beans, and nuts are all healthy, versatile protein sources. Limit red meat.   The diet, however, does go beyond the plate, offering a few other suggestions.   Use healthy plant oils, such as olive, canola, soy, corn, sunflower and peanut. Check the labels, and avoid partially hydrogenated oil, which have unhealthy trans fats.   If youre thirsty, drink water. Coffee and tea are good in moderation, but skip sugary drinks and limit milk and  dairy products to one or two daily servings.   The  type of carbohydrate in the diet is more important than the amount. Some sources of carbohydrates, such as vegetables, fruits, whole grains, and beans-are healthier than others.   Finally, stay active  Signed, Berniece Salines, DO  06/01/2019 8:01 PM    [facility]

## 2019-06-01 NOTE — Patient Instructions (Signed)
Medication Instructions:  Your physician has recommended you make the following change in your medication:   STOP: AMlodipine  START: Diltiazem CD(Cardizem CD) 120 mg Take 1 tab daily  *If you need a refill on your cardiac medications before your next appointment, please call your pharmacy*  Lab Work: None If you have labs (blood work) drawn today and your tests are completely normal, you will receive your results only by: [name] MyChart Message (if you have MyChart) OR . A paper copy in the mail If you have any lab test that is abnormal or we need to change your treatment, we will call you to review the results.  Testing/Procedures: NOne  Follow-Up: At [facility], you and your health needs are our priority.  As part of our continuing mission to provide you with exceptional heart care, we have created designated Provider Care Teams.  These Care Teams include your primary Cardiologist (physician) and Advanced Practice Providers (APPs -  Physician Assistants and Nurse Practitioners) who all work together to provide you with the care you need, when you need it.  Your next appointment:   2 month   The format for your next appointment:   In Person  Provider:   Thomasene Ripple, DO  Other Instructions Diltiazem extended-release capsules or tablets What is this medicine? DILTIAZEM (dil TYE a zem) is a calcium-channel blocker. It affects the amount of calcium found in your heart and muscle cells. This relaxes your blood vessels, which can reduce the amount of work the heart has to do. This medicine is used to treat high blood pressure and chest pain caused by angina. This medicine may be used for other purposes; ask your health care provider or pharmacist if you have questions. COMMON BRAND NAME(S): Cardizem CD, Cardizem LA, Cardizem SR, Cartia XT, Dilacor XR, Dilt-CD, Diltia XT, Diltzac, Matzim LA, Verna Czech, TIADYLT ER, Tiamate, Tiazac What should I tell my health care provider before I take  this medicine? They need to know if you have any of these conditions:  heart problems, low blood pressure, irregular heartbeat  liver disease  previous heart attack  an unusual or allergic reaction to diltiazem, other medicines, foods, dyes, or preservatives  pregnant or trying to get pregnant  breast-feeding How should I use this medicine? Take this medicine by mouth with a glass of water. Follow the directions on the prescription label. Swallow whole, do not crush or chew. Ask your doctor or pharmacist if your should take this medicine with food. Take your doses at regular intervals. Do not take your medicine more often then directed. Do not stop taking except on the advice of your doctor or health care professional. Ask your doctor or health care professional how to gradually reduce the dose. Talk to your pediatrician regarding the use of this medicine in children. Special care may be needed. Overdosage: If you think you have taken too much of this medicine contact a poison control center or emergency room at once. NOTE: This medicine is only for you. Do not share this medicine with others. What if I miss a dose? If you miss a dose, take it as soon as you can. If it is almost time for your next dose, take only that dose. Do not take double or extra doses. What may interact with this medicine? Do not take this medicine with any of the following medications:  cisapride  hawthorn  pimozide  ranolazine  red yeast rice This medicine may also interact with the following  medications:  buspirone  carbamazepine  cimetidine  cyclosporine  digoxin  local anesthetics or general anesthetics  lovastatin  medicines for anxiety or difficulty sleeping like midazolam and triazolam  medicines for high blood pressure or heart problems  quinidine  rifampin, rifabutin, or rifapentine This list may not describe all possible interactions. Give your health care provider a list of  all the medicines, herbs, non-prescription drugs, or dietary supplements you use. Also tell them if you smoke, drink alcohol, or use illegal drugs. Some items may interact with your medicine. What should I watch for while using this medicine? Check your blood pressure and pulse rate regularly. Ask your doctor or health care professional what your blood pressure and pulse rate should be and when you should contact him or her. You may feel dizzy or lightheaded. Do not drive, use machinery, or do anything that needs mental alertness until you know how this medicine affects you. To reduce the risk of dizzy or fainting spells, do not sit or stand up quickly, especially if you are an older patient. Alcohol can make you more dizzy or increase flushing and rapid heartbeats. Avoid alcoholic drinks. What side effects may I notice from receiving this medicine? Side effects that you should report to your doctor or health care professional as soon as possible:  allergic reactions like skin rash, itching or hives, swelling of the face, lips, or tongue  confusion, mental depression  feeling faint or lightheaded, falls  redness, blistering, peeling or loosening of the skin, including inside the mouth  slow, irregular heartbeat  swelling of the feet and ankles  unusual bleeding or bruising, pinpoint red spots on the skin Side effects that usually do not require medical attention (report to your doctor or health care professional if they continue or are bothersome):  constipation or diarrhea  difficulty sleeping  facial flushing  headache  nausea, vomiting  sexual dysfunction  weak or tired This list may not describe all possible side effects. Call your doctor for medical advice about side effects. You may report side effects to FDA at 1-800-FDA-1088. Where should I keep my medicine? Keep out of the reach of children. Store at room temperature between 15 and 30 degrees C (59 and 86 degrees F).  Protect from humidity. Throw away any unused medicine after the expiration date. NOTE: This sheet is a summary. It may not cover all possible information. If you have questions about this medicine, talk to your doctor, pharmacist, or health care provider.  2020 Elsevier/Gold Standard (2007-10-29 14:35:47)

## 2019-06-28 ENCOUNTER — Ambulatory Visit: Payer: Medicare Other | Admitting: Cardiology

## 2019-06-30 DIAGNOSIS — I5189 Other ill-defined heart diseases: Secondary | ICD-10-CM | POA: Insufficient documentation

## 2019-07-29 ENCOUNTER — Encounter: Payer: Self-pay | Admitting: Cardiology

## 2019-07-29 ENCOUNTER — Ambulatory Visit (INDEPENDENT_AMBULATORY_CARE_PROVIDER_SITE_OTHER): Payer: Medicare Other | Admitting: Cardiology

## 2019-07-29 ENCOUNTER — Other Ambulatory Visit: Payer: Self-pay

## 2019-07-29 VITALS — BP 140/90 | HR 85 | Ht 61.4 in | Wt 127.0 lb

## 2019-07-29 DIAGNOSIS — I493 Ventricular premature depolarization: Secondary | ICD-10-CM

## 2019-07-29 DIAGNOSIS — I10 Essential (primary) hypertension: Secondary | ICD-10-CM

## 2019-07-29 NOTE — Patient Instructions (Signed)
Your physician recommends that you continue on your current medications as directed. Please refer to the Current Medication list given to you today.  *If you need a refill on your cardiac medications before your next appointment, please call your pharmacy*  Lab Work: NOne If you have labs (blood work) drawn today and your tests are completely normal, you will receive your results only by: [name] MyChart Message (if you have MyChart) OR . A paper copy in the mail If you have any lab test that is abnormal or we need to change your treatment, we will call you to review the results.  Testing/Procedures: NOne  Follow-Up: At [facility], you and your health needs are our priority.  As part of our continuing mission to provide you with exceptional heart care, we have created designated Provider Care Teams.  These Care Teams include your primary Cardiologist (physician) and Advanced Practice Providers (APPs -  Physician Assistants and Nurse Practitioners) who all work together to provide you with the care you need, when you need it.  Your next appointment:   6 month(s)  The format for your next appointment:   In Person  Provider:   Thomasene Ripple, DO  Other Instructions

## 2019-07-29 NOTE — Progress Notes (Signed)
Cardiology Office Note:    Date:  07/29/2019   ID:  Janet Garcia, DOB 07/03/1946, MRN [id number]  PCP:  [name], PA-C  Cardiologist:  Berniece Salines, DO  Electrophysiologist:  None   Referring MD: [name], PA-C   Chief Complaint  Patient presents with  . Follow-up    History of Present Illness:    Janet Garcia is a 74 y.o. female with a hx of femalewith a hx ofhypertension, hyperlipidemia asthma/COPD, bronchiectasis, GERD, CDID(immunodeficiency disorder)and migraine headaches.  I initially saw the patient April 30, 2019 at which time she presented to be evaluated for palpitations.  At that time I recommended that she wear a ZIO monitor as well as a transthoracic echocardiogram.  There are no changes made to her blood pressure medication at that time.  The patient on June 01, 2019 at that time I discussed her monitor with her which showed evidence of symptomatic PVCs.  During our visit I stopped amlodipine 5 mg daily and started on Cardizem 120 mg.  Continued on her atorvastatin has to follow-up in 2 months.  Here for follow-up visit and tells me that she has been doing well, her symptoms has improved but she reported that when she initially started the Cardizem she had some constipation and dry mouth she thinks improved tremendously.   Past Medical History:  Diagnosis Date  . Bronchiectasis ([place])   . Immune deficiency disorder ([place])   . Pneumonia     Past Surgical History:  Procedure Laterality Date  . TOTAL HIP ARTHROPLASTY Right     Current Medications: Current Meds  Medication Sig  . albuterol (PROAIR HFA) 108 (90 Base) MCG/ACT inhaler Inhale 2 puffs into the lungs every 4 (four) hours as needed for wheezing or shortness of breath.  [name] albuterol (PROVENTIL) (2.5 MG/3ML) 0.083% nebulizer solution 2.5 mg.  . ALPRAZolam (XANAX) 0.25 MG tablet Take 0.25 mg by mouth.  . Artificial Tear Solution (SYSTANE CONTACTS OP) Apply to eye.  [name] aspirin 81 MG EC  tablet Take 81 mg by mouth daily.  [name] atorvastatin (LIPITOR) 20 MG tablet Take 20 mg by mouth.  . Calcium Carbonate-Vitamin D (CALCIUM 600+D) 600-200 MG-UNIT TABS Take 2,000 tablets by mouth.   . Coenzyme Q10 (CO Q 10 PO) Take by mouth daily.  [name] denosumab (PROLIA) 60 MG/ML SOSY injection Prolia  . diltiazem (CARDIZEM CD) 120 MG 24 hr capsule Take 1 capsule (120 mg total) by mouth daily.  [name] diltiazem (TIAZAC) 120 MG 24 hr capsule Take 120 mg by mouth daily.  [name] EPIPEN 2-PAK 0.3 MG/0.3ML SOAJ injection Inject 0.3 mLs (0.3 mg total) into the muscle as needed for anaphylaxis.  . fluticasone furoate-vilanterol (BREO ELLIPTA) 200-25 MCG/INH AEPB Inhale 1 puff into the lungs daily.  [name] ibuprofen (ADVIL,MOTRIN) 200 MG tablet Take 200 mg by mouth.  . Immune Globulin-Hyaluronidase (HYQVIA) 30 GM/300ML KIT Inject into the skin. q 3 weeks  . loratadine (CLARITIN) 10 MG tablet Take by mouth.  . montelukast (SINGULAIR) 10 MG tablet   . Multiple Vitamin (MULTIVITAMIN) tablet Take by mouth.  [name] omeprazole (PRILOSEC) 20 MG capsule   . polyethylene glycol powder (GLYCOLAX/MIRALAX) 17 GM/SCOOP powder Take by mouth.  . sertraline (ZOLOFT) 50 MG tablet Take by mouth.  . vitamin B-12 (CYANOCOBALAMIN) 1000 MCG tablet Take by mouth.     Allergies:   Fish allergy, Guaifenesin, Guaifenesin er, Levofloxacin, Codeine, Erythromycin, Sulfa antibiotics, and Sulfur   Social History   Socioeconomic History  . Marital status:  Married    Spouse name: Not on file  . Number of children: Not on file  . Years of education: Not on file  . Highest education level: Not on file  Occupational History  . Not on file  Tobacco Use  . Smoking status: Never Smoker  . Smokeless tobacco: Never Used  Substance and Sexual Activity  . Alcohol use: No  . Drug use: No  . Sexual activity: Yes  Other Topics Concern  . Not on file  Social History Narrative  . Not on file   Social Determinants of Health   Financial Resource Strain:     . Difficulty of Paying Living Expenses: Not on file  Food Insecurity:   . Worried About Charity fundraiser in the Last Year: Not on file  . Ran Out of Food in the Last Year: Not on file  Transportation Needs:   . Lack of Transportation (Medical): Not on file  . Lack of Transportation (Non-Medical): Not on file  Physical Activity:   . Days of Exercise per Week: Not on file  . Minutes of Exercise per Session: Not on file  Stress:   . Feeling of Stress : Not on file  Social Connections:   . Frequency of Communication with Friends and Family: Not on file  . Frequency of Social Gatherings with Friends and Family: Not on file  . Attends Religious Services: Not on file  . Active Member of Clubs or Organizations: Not on file  . Attends Archivist Meetings: Not on file  . Marital Status: Not on file     Family History: The patient's family history includes Hypertension in her mother. There is no history of Allergic rhinitis, Angioedema, Asthma, Eczema, Immunodeficiency, or Urticaria.  ROS:   Review of Systems  Constitution: Negative for decreased appetite, fever and weight gain.  HENT: Negative for congestion, ear discharge, hoarse voice and sore throat.   Eyes: Negative for discharge, redness, vision loss in right eye and visual halos.  Cardiovascular: Negative for chest pain, dyspnea on exertion, leg swelling, orthopnea and palpitations.  Respiratory: Negative for cough, hemoptysis, shortness of breath and snoring.   Endocrine: Negative for heat intolerance and polyphagia.  Hematologic/Lymphatic: Negative for bleeding problem. Does not bruise/bleed easily.  Skin: Negative for flushing, nail changes, rash and suspicious lesions.  Musculoskeletal: Negative for arthritis, joint pain, muscle cramps, myalgias, neck pain and stiffness.  Gastrointestinal: Negative for abdominal pain, bowel incontinence, diarrhea and excessive appetite.  Genitourinary: Negative for decreased libido,  genital sores and incomplete emptying.  Neurological: Negative for brief paralysis, focal weakness, headaches and loss of balance.  Psychiatric/Behavioral: Negative for altered mental status, depression and suicidal ideas.  Allergic/Immunologic: Negative for HIV exposure and persistent infections.    EKGs/Labs/Other Studies Reviewed:    The following studies were reviewed today:   EKG: None today  Recent Labs: 10/20/2018: Platelets 238 04/30/2019: ALT 23; BUN 10; Creatinine, Ser 0.80; Hemoglobin 12.7; Potassium 3.8; Sodium 142; TSH 1.350  Recent Lipid Panel No results found for: CHOL, TRIG, HDL, CHOLHDL, VLDL, LDLCALC, LDLDIRECT  Physical Exam:    VS:  BP 140/90 (BP Location: Right Arm, Patient Position: Sitting, Cuff Size: Normal)   Pulse 85   Ht 5' 1.4" (1.56 m)   Wt 127 lb (57.6 kg)   SpO2 96%   BMI 23.68 kg/m     Wt Readings from Last 3 Encounters:  07/29/19 127 lb (57.6 kg)  06/01/19 128 lb (58.1 kg)  04/30/19 130  lb (59 kg)     GEN: Well nourished, well developed in no acute distress HEENT: Normal NECK: No JVD; No carotid bruits LYMPHATICS: No lymphadenopathy CARDIAC: S1S2 noted,RRR, no murmurs, rubs, gallops RESPIRATORY:  Clear to auscultation without rales, wheezing or rhonchi  ABDOMEN: Soft, non-tender, non-distended, +bowel sounds, no guarding. EXTREMITIES: No edema, No cyanosis, no clubbing MUSCULOSKELETAL:  No edema; No deformity  SKIN: Warm and dry NEUROLOGIC:  Alert and oriented x 3, non-focal PSYCHIATRIC:  Normal affect, good insight  ASSESSMENT:    1. Essential (primary) hypertension   2. Symptomatic PVCs    PLAN:     She is happy with her improvement with the heart rate as well as her blood pressure.  She tells me that her average blood pressure at home is in the 120s to 130s and has not been over 892 systolic since she started her Cardizem.  Continue patient on her home current medication regimen.  The patient is in agreement with the above  plan. The patient left the office in stable condition.  The patient will follow up in sooner if needed   Medication Adjustments/Labs and Tests Ordered: Current medicines are reviewed at length with the patient today.  Concerns regarding medicines are outlined above.  No orders of the defined types were placed in this encounter.  No orders of the defined types were placed in this encounter.   Patient Instructions  Your physician recommends that you continue on your current medications as directed. Please refer to the Current Medication list given to you today.  *If you need a refill on your cardiac medications before your next appointment, please call your pharmacy*  Lab Work: NOne If you have labs (blood work) drawn today and your tests are completely normal, you will receive your results only by: [name] MyChart Message (if you have MyChart) OR . A paper copy in the mail If you have any lab test that is abnormal or we need to change your treatment, we will call you to review the results.  Testing/Procedures: NOne  Follow-Up: At [facility], you and your health needs are our priority.  As part of our continuing mission to provide you with exceptional heart care, we have created designated Provider Care Teams.  These Care Teams include your primary Cardiologist (physician) and Advanced Practice Providers (APPs -  Physician Assistants and Nurse Practitioners) who all work together to provide you with the care you need, when you need it.  Your next appointment:   6 month(s)  The format for your next appointment:   In Person  Provider:   Berniece Salines, DO  Other Instructions      Adopting a Healthy Lifestyle.  Know what a healthy weight is for you (roughly BMI <25) and aim to maintain this   Aim for 7+ servings of fruits and vegetables daily   65-80+ fluid ounces of water or unsweet tea for healthy kidneys   Limit to max 1 drink of alcohol per day; avoid smoking/tobacco     Limit animal fats in diet for cholesterol and heart health - choose grass fed whenever available   Avoid highly processed foods, and foods high in saturated/trans fats   Aim for low stress - take time to unwind and care for your mental health   Aim for 150 min of moderate intensity exercise weekly for heart health, and weights twice weekly for bone health   Aim for 7-9 hours of sleep daily   When it comes to diets,  agreement about the perfect plan isnt easy to find, even among the experts. Experts at the [employer] developed an idea known as the Healthy Eating Plate. Just imagine a plate divided into logical, healthy portions.   The emphasis is on diet quality:   Load up on vegetables and fruits - one-half of your plate: Aim for color and variety, and remember that potatoes dont count.   Go for whole grains - one-quarter of your plate: Whole wheat, barley, wheat berries, quinoa, oats, brown rice, and foods made with them. If you want pasta, go with whole wheat pasta.   Protein power - one-quarter of your plate: Fish, chicken, beans, and nuts are all healthy, versatile protein sources. Limit red meat.   The diet, however, does go beyond the plate, offering a few other suggestions.   Use healthy plant oils, such as olive, canola, soy, corn, sunflower and peanut. Check the labels, and avoid partially hydrogenated oil, which have unhealthy trans fats.   If youre thirsty, drink water. Coffee and tea are good in moderation, but skip sugary drinks and limit milk and dairy products to one or two daily servings.   The type of carbohydrate in the diet is more important than the amount. Some sources of carbohydrates, such as vegetables, fruits, whole grains, and beans-are healthier than others.   Finally, stay active  Signed, Berniece Salines, DO  07/29/2019 10:46 AM    [place]

## 2019-08-14 ENCOUNTER — Encounter: Payer: Self-pay | Admitting: Allergy & Immunology

## 2019-08-16 ENCOUNTER — Telehealth: Payer: Self-pay

## 2019-08-16 NOTE — Telephone Encounter (Signed)
Yes I would definitely schedule a COVID vaccine. The only ones on the market right now [employer] and Gala Murdoch - are not live vaccines and should be safe for you. Prepare to feel crummy for a day or two afterwards.   Shingrix is fine too. It is not live and should cause no problems with your immunodeficiency. Again, this one is well known to cause a crummy feeling for 1-2 days afterwards.   [name], MD [facility]

## 2019-08-16 NOTE — Telephone Encounter (Signed)
Janet Garcia, [place] "Janet Garcia"  Alfonse Spruce, MD 2 days ago   Good Afternoon Francis Dowse, This is [employer].  I am writing to ask if I am clear to schedule a Covid 19 Vaccination?  I have not been able to schedule  an appointment for the vaccination, however, I thought I would ask. Also, I am I ok to take the new Shingles Vaccine?   Thanks so much.  Have a great weekend! Janet Garcia   Please advise

## 2019-08-16 NOTE — Telephone Encounter (Signed)
Left message for patient to call us back.  

## 2019-08-19 ENCOUNTER — Other Ambulatory Visit: Payer: Self-pay

## 2019-08-19 DIAGNOSIS — J4550 Severe persistent asthma, uncomplicated: Secondary | ICD-10-CM

## 2019-08-19 MED ORDER — ALBUTEROL SULFATE HFA 108 (90 BASE) MCG/ACT IN AERS: 2.0000 | INHALATION_SPRAY | RESPIRATORY_TRACT | 0 refills | Status: DC | PRN

## 2019-08-19 NOTE — Telephone Encounter (Signed)
Patient called back. I let her know of Dr.Gallagher's recommendations. Patient voiced understanding. Patient also requested refill for rescue inhaler. Darreld Mclean sent in RX.

## 2019-10-04 ENCOUNTER — Telehealth: Payer: Self-pay | Admitting: Cardiology

## 2019-10-04 NOTE — Telephone Encounter (Signed)
New message   Pt c/o BP issue: STAT if pt c/o blurred vision, one-sided weakness or slurred speech  1. What are your last 5 BP readings? 180/89 160/84 154/85  156/79 159/85  2. Are you having any other symptoms (ex. Dizziness, headache, blurred vision, passed out)?dizziness   3. What is your BP issue? Patient states that b/p is elevated.

## 2019-10-04 NOTE — Telephone Encounter (Signed)
Spoke with patient who states BP has been elevated since last Thursday, 3/11. She states she became aware of this because of new onset dizziness. Prior to last week, her SBP readings were generally 120-130's and DBP 60-70's. Reports pulse 93, but is usually 110-130 bpm. She states she previously took amlodipine for BP but currently is only taking cardizem 120 mg. I asked about high sodium diet and she states she did eat [place] food on one occasion, but she has increased her water intake and the BP remains elevated.  I advised that the amlodipine will not benefit her elevated pulse so I will forward to Dr. Servando Salina for advice. Patient verbalized understanding and agreement and thanked me for the call.

## 2019-11-24 ENCOUNTER — Other Ambulatory Visit: Payer: Self-pay | Admitting: *Deleted

## 2019-11-24 MED ORDER — DILTIAZEM HCL ER COATED BEADS 120 MG PO CP24: 120.0000 mg | ORAL_CAPSULE | Freq: Every day | ORAL | 0 refills | Status: DC

## 2019-11-24 NOTE — Telephone Encounter (Signed)
Received fax request for refill on Diltiazem CD 120 mg from [facility] pharmacy. Rx sent to pharmacy for #90 with 0 RF with notation to call office to schedule follow up appointment.

## 2020-01-07 ENCOUNTER — Other Ambulatory Visit: Payer: Self-pay | Admitting: *Deleted

## 2020-01-07 DIAGNOSIS — J3089 Other allergic rhinitis: Secondary | ICD-10-CM

## 2020-01-07 MED ORDER — BREO ELLIPTA 200-25 MCG/INH IN AEPB: 1.0000 | INHALATION_SPRAY | Freq: Every day | RESPIRATORY_TRACT | 0 refills | Status: DC

## 2020-01-14 ENCOUNTER — Telehealth: Payer: Self-pay

## 2020-01-14 NOTE — Telephone Encounter (Signed)
Spoke to [facility] needed renewal of her IG orders to continue on her HyQvia 10%. Dr. Dellis Anes signed her new orders and I also scheduled pt ov appointment with Dr. Dellis Anes for august 2cd for 11:30 am pt. Aware. [facility] [employer]. Needed updated office notes, IG panel labs,All lab work. I sent 04/26/2019 office notes and 04/29/2020 lab results.

## 2020-02-09 ENCOUNTER — Other Ambulatory Visit: Payer: Self-pay | Admitting: Cardiology

## 2020-02-19 ENCOUNTER — Other Ambulatory Visit: Payer: Self-pay | Admitting: Allergy & Immunology

## 2020-02-19 DIAGNOSIS — J3089 Other allergic rhinitis: Secondary | ICD-10-CM

## 2020-02-21 ENCOUNTER — Encounter: Payer: Self-pay | Admitting: Allergy & Immunology

## 2020-02-21 ENCOUNTER — Ambulatory Visit (INDEPENDENT_AMBULATORY_CARE_PROVIDER_SITE_OTHER): Payer: Medicare Other | Admitting: Allergy & Immunology

## 2020-02-21 ENCOUNTER — Other Ambulatory Visit: Payer: Self-pay

## 2020-02-21 VITALS — BP 128/78 | HR 98 | Resp 18 | Ht 61.0 in | Wt 130.0 lb

## 2020-02-21 DIAGNOSIS — E538 Deficiency of other specified B group vitamins: Secondary | ICD-10-CM

## 2020-02-21 DIAGNOSIS — J3089 Other allergic rhinitis: Secondary | ICD-10-CM

## 2020-02-21 DIAGNOSIS — J310 Chronic rhinitis: Secondary | ICD-10-CM | POA: Diagnosis not present

## 2020-02-21 DIAGNOSIS — J479 Bronchiectasis, uncomplicated: Secondary | ICD-10-CM

## 2020-02-21 DIAGNOSIS — D839 Common variable immunodeficiency, unspecified: Secondary | ICD-10-CM

## 2020-02-21 DIAGNOSIS — J4550 Severe persistent asthma, uncomplicated: Secondary | ICD-10-CM

## 2020-02-21 NOTE — Progress Notes (Signed)
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 Su Monks PRN  Chronic rhinitis  Gastroesophageal reflux disease- on a PPI  Chronic back pain- s/p physical therapy   Overall, Janet Garcia is doing remarkably well.  She did utilize 1 course of antibiotics in the 12 months since I have seen her, which is excellent.  Her infusions are going very well after troubleshooting some of her needle issues.  She did finally agreed to get the genetic testing.  This will help her stratify her clinical picture.  We will get that drawn in the [place] office to send to [facility].  We are also going to check on her IgG levels as well as IgA and IgM levels.  We will get a CBC and a metabolic panel as well.  I am worried about her breathing status.  She continues to be hoarse which is baseline for her.  However, I would like her to get another chest CT to follow-up on her bronchiectasis.  Her pulmonologist is sent to follow-up as needed, but I would like to be sure that everything is looking okay in her lungs.  We will consider this at the next visit.  Plan/Recommendations:   1. Common variable immunodeficiency - We will continue with the elevated dosing of Hyqvia.  - We will get some routine labs (compete metabolic panel, complete blood count, and IgG level).  - We will also get the genetic testing at that time. - I will call you this week to confirm that we have everything in place.   2. Bronchiectasis without complication - Follow up with Dr. Su Monks as needed. - I would consider a chest CT sometime this year to see I your bronchiectasis has worsened.   3. Severe persistent asthma, uncomplicated - Lung function looked very  normal today.  - Daily controller medication(s): Breo 200/16mcg one puff once daily - Prior to physical activity: ProAir 2 puffs 10-15 minutes before physical activity. - Rescue medications: ProAir 4 puffs every 4-6 hours as needed or albuterol nebulizer one vial every 4-6 hours as needed - Changes during respiratory infections or worsening symptoms: Add on Arnuity to one inhalation twice daily for TWO WEEKS. - Asthma control goals:  * Full participation in all desired activities (may need albuterol before activity) * Albuterol use two time or less a week on average (not counting use with activity) * Cough interfering with sleep two time or less a month * Oral steroids no more than once a year * No hospitalizations  4. Chronic rhinitis - Continue with Flonase one spray per nostril daily. - Consider using nasal saline rinses prior to using the Flonase.   5. Gastroesophageal reflux disease - Continue with omeprazole at the current dosing.  6. Return in about 4 months (around 06/22/2020). This can be an in-person, a virtual Webex or a telephone follow up visit.   Subjective:   Janet Garcia is a 74 y.o. female presenting today for follow up of  Chief Complaint  Patient presents with   CVID   Asthma    does have a repetitive cough that she says she has all the time despite what she does. her mask does not helo, but she wears them anyway.     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   Lumbosacral spondylosis 07/26/2015   Asthma, mild persistent 07/05/2015   Abnormal CAT scan 06/30/2015   PNA (pneumonia) 06/21/2015   Anxiety 05/23/2015   HLD (hyperlipidemia) 03/24/2015   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   Family history of colonic polyps 01/13/2015   Recurrent disease 11/21/2014   Essential (primary) hypertension 08/15/2014   Anxiety, generalized 08/15/2014   Left lower lobe pneumonia 04/16/2014   Arthritis, degenerative 04/16/2014   Chronic infection of sinus 03/15/2014   Bronchiectasis (HCC) 03/10/2014   H/O respiratory system disease 03/10/2014   Can't get food down 03/01/2014   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  [name] is a 74 y.o. female presenting for a follow up visit.  She is well-known to this practice and has a history of common variable immune deficiency.  She was last seen in October 2020.  At that time, we continue with her elevated dosing of HyQvia.  She was having some infusion issues and I recommended that she talk to her home nurse about this or the company itself.  We continued with the same dosing since this was working well to prevent any breakthrough infections.  She has a history of bronchiectasis and she was following up with Dr. Su Monks as needed.  For her asthma, would continue with Breo 200/25 mcg 1 puff once daily.  She was not needing her rescue inhaler much at all.  For her  rhinitis, would continue with Flonase as well as nasal saline rinses.  Since the last visit, she has done well. 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  She said she is enjoying this a lot. She is vaccinated for [employer]. She had no reactions to these.   Asthma/Respiratory Symptom History: She remains on the Breo one puff once daily. She is using her rescue inhaler more often around once per day. She would go months without using it. She does have some yellow mucous from her cough. She thinks that this is all from the masking. She continues to have the pickel and has her nebulizer to have one hand just in case. She does not follow up with Dr. Su Monks.   Allergic Rhinitis Symptom History: She remains on the Flonase as well as a nasal saline rinses.  Overall, she feels like she is doing fairly well.    Infectious Symptom History: She reports that her energy level is getting lower. Her speciality pharmacy moved to [place]. It has been seamless. She is using Benadryl one hour before and they used a shorter needle and a longer  infusion time. It takes almost three hours. But she does this in the evening. She has needed antibiotics once to treat a low grade fever for one or two weeks. This was two months ago.   She has had some issues with erratic blood pressures.  She is now on diltiazem which seems to be working well.  She continues to take care of her ex-husband's mother. She lives in [place]. [name] is traveling to [place] soon to participate in her daughter's wedding.  Her daughter is getting married to a [place] native, but they will continue to live in [place].  Otherwise, there have been no changes to her past medical history, surgical history, family history, or social history.    Review of Systems  Constitutional: Negative.  Negative for chills, fever, malaise/fatigue and weight loss.  HENT: Positive for congestion. Negative for ear discharge, ear pain and sinus pain.         Positive for hoarseness.  Eyes: Negative for pain, discharge and redness.  Respiratory: Negative for cough, sputum production, shortness of breath and wheezing.   Cardiovascular: Negative.  Negative for chest pain and palpitations.  Gastrointestinal: Negative for abdominal pain, constipation, diarrhea, heartburn, nausea and vomiting.  Skin: Negative.  Negative for itching and rash.  Neurological: Negative for dizziness and headaches.  Endo/Heme/Allergies: Positive for environmental allergies. Does not bruise/bleed easily.       Objective:   Blood pressure 128/78, pulse 98, resp. rate 18, height 5\' 1"  (1.549 m), weight 130 lb (59 kg), SpO2 97 %. Body mass index is 24.56 kg/m.   Physical Exam:  Physical Exam Constitutional:      Appearance: She is well-developed.     Comments: Pleasant female.  Very talkative and adorable.  HENT:     Head: Normocephalic and atraumatic.     Right Ear: Tympanic membrane, ear canal and external ear normal.     Left Ear: Tympanic membrane, ear canal and external ear normal.     Nose: No nasal deformity, septal deviation, mucosal edema or rhinorrhea.     Right Turbinates: Enlarged. Not swollen or pale.     Left Turbinates: Enlarged. Not swollen or pale.     Right Sinus: No maxillary sinus tenderness or frontal sinus tenderness.     Left Sinus: No maxillary sinus tenderness or frontal sinus tenderness.     Comments: Scant clear mucus.    Mouth/Throat:     Mouth: Mucous membranes are not pale and not dry.     Pharynx: Uvula midline.     Comments: There is some cobblestoning in the posterior oropharynx.  She is rather hoarse today, which she reports is her baseline.  It does seem worse than previous visits. Eyes:     General:        Right eye: No discharge.        Left eye: No discharge.     Conjunctiva/sclera: Conjunctivae normal.     Right eye: Right conjunctiva is not injected. No chemosis.    Left eye: Left conjunctiva is not injected. No  chemosis.    Pupils: Pupils are equal, round, and reactive to light.  Cardiovascular:     Rate and Rhythm: Normal rate and regular rhythm.     Heart sounds: Normal heart sounds.  Pulmonary:     Effort: Pulmonary effort is normal. No tachypnea, accessory muscle usage or respiratory distress.     Breath sounds: Normal breath sounds. No wheezing, rhonchi or rales.     Comments: Moving  air well in all lung fields.  No increased work of breathing. Chest:     Chest wall: No tenderness.  Lymphadenopathy:     Cervical: No cervical adenopathy.  Skin:    Coloration: Skin is not pale.     Findings: No abrasion, erythema, petechiae or rash. Rash is not papular, urticarial or vesicular.     Comments: No eczematous or urticarial lesions noted.  Neurological:     Mental Status: She is alert.  Psychiatric:        Behavior: Behavior is cooperative.      Diagnostic studies:    Spirometry: results normal (FEV1: 1.74/92%, FVC: 2.04/83%, FEV1/FVC: 85%).    Spirometry consistent with normal pattern.   Allergy Studies: none        Malachi BondsJoel Prateek Knipple, MD  [facility]

## 2020-02-21 NOTE — Patient Instructions (Signed)
1. Common variable immunodeficiency - We will continue with the elevated dosing of Hyqvia.  - We will get some routine labs (compete metabolic panel, complete blood count, and IgG level).  - We will also get the genetic testing at that time. - I will call you this week to confirm that we have everything in place.   2. Bronchiectasis without complication - Follow up with Dr. Su Monks as needed. - I would consider a chest CT sometime this year to see I your bronchiectasis has worsened.   3. Severe persistent asthma, uncomplicated - Lung function looked very normal today.  - Daily controller medication(s): Breo 200/40mcg one puff once daily - Prior to physical activity: ProAir 2 puffs 10-15 minutes before physical activity. - Rescue medications: ProAir 4 puffs every 4-6 hours as needed or albuterol nebulizer one vial every 4-6 hours as needed - Changes during respiratory infections or worsening symptoms: Add on Arnuity to one inhalation twice daily for TWO WEEKS. - Asthma control goals:  * Full participation in all desired activities (may need albuterol before activity) * Albuterol use two time or less a week on average (not counting use with activity) * Cough interfering with sleep two time or less a month * Oral steroids no more than once a year * No hospitalizations  4. Chronic rhinitis - Continue with Flonase one spray per nostril daily. - Consider using nasal saline rinses prior to using the Flonase.   5. Gastroesophageal reflux disease - Continue with omeprazole at the current dosing.  6. Return in about 4 months (around 06/22/2020). This can be an in-person, a virtual Webex or a telephone follow up visit.   Please inform us of any Emergency Department visits, hospitalizations, or changes in symptoms. Call us before going to the ED for breathing or allergy symptoms since we might be able to fit you in for a sick visit. Feel free to contact us anytime with any questions,  problems, or concerns.  It was a pleasure to see you again today!  Websites that have reliable patient information: 1. American Academy of Asthma, Allergy, and Immunology: www.aaaai.org 2. Food Allergy Research and Education (FARE): foodallergy.org 3. Mothers of Asthmatics: http://www.asthmacommunitynetwork.org 4. American College of Allergy, Asthma, and Immunology: www.acaai.org   COVID-19 Vaccine Information can be found at: PodExchange.nl For questions related to vaccine distribution or appointments, please email [email or web address] or call [phone].     "Like" [place] on Facebook and Instagram for our latest updates!        Make sure you are registered to vote! If you have moved or changed any of your contact information, you will need to get this updated before voting!  In some cases, you MAY be able to register to vote online: [email or web address]

## 2020-02-22 ENCOUNTER — Telehealth: Payer: Self-pay

## 2020-02-22 ENCOUNTER — Encounter: Payer: Self-pay | Admitting: Allergy & Immunology

## 2020-02-22 MED ORDER — BREO ELLIPTA 200-25 MCG/INH IN AEPB: 1.0000 | INHALATION_SPRAY | Freq: Every day | RESPIRATORY_TRACT | 5 refills | Status: DC

## 2020-02-22 NOTE — Telephone Encounter (Signed)
Error

## 2020-02-23 LAB — CMP14+EGFR
ALT: 23 IU/L (ref 0–32)
AST: 28 IU/L (ref 0–40)
Albumin/Globulin Ratio: 1.4 (ref 1.2–2.2)
Albumin: 4.3 g/dL (ref 3.7–4.7)
Alkaline Phosphatase: 98 IU/L (ref 48–121)
BUN/Creatinine Ratio: 14 (ref 12–28)
BUN: 11 mg/dL (ref 8–27)
Bilirubin Total: <0.2 mg/dL (ref 0.0–1.2)
CO2: 24 mmol/L (ref 20–29)
Calcium: 9.1 mg/dL (ref 8.7–10.3)
Chloride: 103 mmol/L (ref 96–106)
Creatinine, Ser: 0.81 mg/dL (ref 0.57–1.00)
GFR calc Af Amer: 83 mL/min/{1.73_m2} (ref >59)
GFR calc non Af Amer: 72 mL/min/{1.73_m2} (ref >59)
Globulin, Total: 3.1 g/dL (ref 1.5–4.5)
Glucose: 98 mg/dL (ref 65–99)
Potassium: 4.2 mmol/L (ref 3.5–5.2)
Sodium: 140 mmol/L (ref 134–144)
Total Protein: 7.4 g/dL (ref 6.0–8.5)

## 2020-02-23 LAB — CBC WITH DIFFERENTIAL/PLATELET
Basophils Absolute: 0.1 10*3/uL (ref 0.0–0.2)
Basos: 1 %
EOS (ABSOLUTE): 0.1 10*3/uL (ref 0.0–0.4)
Eos: 1 %
Hematocrit: 40.4 % (ref 34.0–46.6)
Hemoglobin: 13.4 g/dL (ref 11.1–15.9)
Immature Grans (Abs): 0 10*3/uL (ref 0.0–0.1)
Immature Granulocytes: 0 %
Lymphocytes Absolute: 1.3 10*3/uL (ref 0.7–3.1)
Lymphs: 25 %
MCH: 32.7 pg (ref 26.6–33.0)
MCHC: 33.2 g/dL (ref 31.5–35.7)
MCV: 99 fL — ABNORMAL HIGH (ref 79–97)
Monocytes Absolute: 0.4 10*3/uL (ref 0.1–0.9)
Monocytes: 8 %
Neutrophils Absolute: 3.5 10*3/uL (ref 1.4–7.0)
Neutrophils: 65 %
Platelets: 262 10*3/uL (ref 150–450)
RBC: 4.1 x10E6/uL (ref 3.77–5.28)
RDW: 13 % (ref 11.7–15.4)
WBC: 5.4 10*3/uL (ref 3.4–10.8)

## 2020-02-23 LAB — IGG, IGA, IGM
IgA/Immunoglobulin A, Serum: 435 mg/dL — ABNORMAL HIGH (ref 64–422)
IgG (Immunoglobin G), Serum: 1332 mg/dL (ref 586–1602)
IgM (Immunoglobulin M), Srm: 46 mg/dL (ref 26–217)

## 2020-06-26 ENCOUNTER — Encounter: Payer: Self-pay | Admitting: Allergy & Immunology

## 2020-06-26 ENCOUNTER — Other Ambulatory Visit: Payer: Self-pay

## 2020-06-26 ENCOUNTER — Ambulatory Visit (INDEPENDENT_AMBULATORY_CARE_PROVIDER_SITE_OTHER): Payer: Medicare Other | Admitting: Allergy & Immunology

## 2020-06-26 VITALS — BP 118/70 | HR 90 | Resp 18

## 2020-06-26 DIAGNOSIS — J310 Chronic rhinitis: Secondary | ICD-10-CM | POA: Diagnosis not present

## 2020-06-26 DIAGNOSIS — D839 Common variable immunodeficiency, unspecified: Secondary | ICD-10-CM

## 2020-06-26 DIAGNOSIS — E559 Vitamin D deficiency, unspecified: Secondary | ICD-10-CM

## 2020-06-26 DIAGNOSIS — J4550 Severe persistent asthma, uncomplicated: Secondary | ICD-10-CM

## 2020-06-26 DIAGNOSIS — E538 Deficiency of other specified B group vitamins: Secondary | ICD-10-CM

## 2020-06-26 NOTE — Patient Instructions (Addendum)
1. Common variable immunodeficiency - We will continue with the elevated dosing of Hyqvia.  - We will get some routine labs at the next visit.  - I sent you a copy of the genetic testing, but essentially there is nothing exciting in it (which is great).   2. Severe persistent asthma, uncomplicated - Lung function looked very normal today.   - Stop the Breo temporarily and start Trelegy one puff once daily (samples provided). - Trelegy contains Breo + another medication to help keep your lungs open.  - Let us know how this is going in a few weeks.  - Daily controller medication(s): Trelegy 200/62.5/25 one puff once daily - Prior to physical activity: ProAir 2 puffs 10-15 minutes before physical activity. - Rescue medications: ProAir 4 puffs every 4-6 hours as needed or albuterol nebulizer one vial every 4-6 hours as needed - Changes during respiratory infections or worsening symptoms: Add on Arnuity to one inhalation twice daily for TWO WEEKS. - Asthma control goals:  * Full participation in all desired activities (may need albuterol before activity) * Albuterol use two time or less a week on average (not counting use with activity) * Cough interfering with sleep two time or less a month * Oral steroids no more than once a year * No hospitalizations  3. Chronic rhinitis - Continue with Flonase one spray per nostril daily. - Consider using nasal saline rinses prior to using the Flonase.   4. Gastroesophageal reflux disease - Continue with omeprazole at the current dosing.  5. Return in about 4 months (around 10/25/2020).    Please inform us of any Emergency Department visits, hospitalizations, or changes in symptoms. Call us before going to the ED for breathing or allergy symptoms since we might be able to fit you in for a sick visit. Feel free to contact us anytime with any questions, problems, or concerns.  It was a pleasure to see you again today!  Websites that have reliable  patient information: 1. American Academy of Asthma, Allergy, and Immunology: www.aaaai.org 2. Food Allergy Research and Education (FARE): foodallergy.org 3. Mothers of Asthmatics: http://www.asthmacommunitynetwork.org 4. American College of Allergy, Asthma, and Immunology: www.acaai.org   COVID-19 Vaccine Information can be found at: PodExchange.nl For questions related to vaccine distribution or appointments, please email [email or web address] or call [phone].     "Like" [place] on Facebook and Instagram for our latest updates!     HAPPY FALL!     Make sure you are registered to vote! If you have moved or changed any of your contact information, you will need to get this updated before voting!  In some cases, you MAY be able to register to vote online: [email or web address]

## 2020-06-26 NOTE — Progress Notes (Signed)
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 Creta Levin  Chronic rhinitis  Gastroesophageal reflux disease- on a PPI  Chronic back pain- s/p physical therapy  Plan/Recommendations:   1. Common variable immunodeficiency - We will continue with the elevated dosing of Hyqvia.  - We will get some routine labs at the next visit.  - I sent you a copy of the genetic testing, but essentially there is nothing exciting in it (which is great).   2. Severe persistent asthma, uncomplicated - Lung function looked very normal today.   - Stop the Breo temporarily and start Trelegy one puff once daily (samples provided). - Trelegy contains Breo + another medication to help keep your lungs open.  - Let us know how this is going in a few weeks.  - Daily controller medication(s): Trelegy 200/62.5/25 one puff once daily - Prior to physical activity: ProAir 2 puffs 10-15 minutes before physical activity. - Rescue medications: ProAir 4 puffs every 4-6 hours as needed or albuterol nebulizer one vial every 4-6 hours as needed - Changes during respiratory infections or worsening symptoms: Add on Arnuity to one inhalation twice daily for TWO WEEKS. - Asthma control goals:  * Full participation in all desired activities (may need albuterol before activity) * Albuterol use two time or less a week on average (not counting use with activity) * Cough interfering with sleep two time or less a month * Oral steroids no more than once a year * No hospitalizations  3. Chronic rhinitis - Continue with Flonase one spray per nostril daily. - Consider using nasal saline rinses prior to using the Flonase.   4.  Gastroesophageal reflux disease - Continue with omeprazole at the current dosing.  5. Return in about 4 months (around 10/25/2020).   Subjective:   Janet Garcia is a 74 y.o. female presenting today for follow up of  Chief Complaint  Patient presents with  . Asthma    doing pretty good. asthma has been a tad testy, unsure if tit is the weather or not.   Janet Garcia CVID    Shianna Garcia has a history of the following: Patient Active Problem List   Diagnosis Date Noted  . 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 B12 deficiency 05/10/2017  . Recurrent infections 02/24/2016  . 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/24/2016  . Severe persistent asthma 02/24/2016  . Chronic rhinitis 02/24/2016  . Anemia of chronic disease 02/24/2016  . Opacity of lung on imaging study 02/24/2016  . Moderate persistent asthma 09/15/2015  . Esophageal reflux 09/15/2015  . Essential hypertension 09/15/2015  . Abnormal liver enzymes 07/26/2015  . 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 Lumbosacral spondylosis 07/26/2015  . Asthma, mild persistent 07/05/2015  . Abnormal CAT scan 06/30/2015  . PNA (pneumonia) 06/21/2015  . Anxiety 05/23/2015  . HLD (hyperlipidemia) 03/24/2015  . 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 Family history of colonic polyps 01/13/2015  . Recurrent disease 11/21/2014  . Essential (primary) hypertension 08/15/2014  . Anxiety, generalized 08/15/2014  . Left lower lobe pneumonia 04/16/2014  . Arthritis, degenerative 04/16/2014  . Chronic infection of sinus 03/15/2014  . Bronchiectasis (HCC) 03/10/2014  . H/O respiratory system disease 03/10/2014  . Can't get food down 03/01/2014  .  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 Lumbar radiculopathy 02/28/2014  . Headache, migraine 02/28/2014  . Osteopenia 02/28/2014  . Airway hyperreactivity 02/24/2014  . Closed nondisplaced fracture of proximal phalanx of lesser  toe of right foot 01/24/2014  . Allergic rhinitis 11/05/2013  . Degeneration of intervertebral disc of lumbosacral region 11/05/2013  . Esophagitis, reflux 11/05/2013    History obtained from: chart review and patient.  Janet Garcia is a 74 y.o. female presenting for a follow up visit.  She was last seen in August 2021.  At that time, she was doing very well on her HyQvia every 3 weeks.  We did obtain some routine screening labs and all of these were normal.  We did decide to do a PID genetic screening through [facility].  Lung function looked normal.  We continued with Breo 200 mcg 1 puff once daily as well as albuterol as needed.  For her rhinitis, would continue with the Flonase as well as nasal saline rinses.  We also continued the omeprazole.  This was controlling her hoarseness fairly well.  Since last visit, she has done very well.  Over the summer, she did go to [place] to see the marriage between her daughter and her now son-in-law.  They had a really great time there and spent around a week doing to touristy stuff.  She highly recommends this as a place to visit.  She is officially retired and of course, but she continues to stay very active. She does report that she has been more fatigued than usual, but she thinks that this is related to her age. She sleeps at 9pm watching TV. She then wakes up at 11:30 and she cannot go to sleep after that. So she does not have the bets sleeping habits. Her husband does not have a problem sleeping at all. She does not take naps, while her husband does. Her husband continues to do some rental houses.    She has not had a winter infection. She has not needed any antibiotics in quite some time. She is happy with how her infusions are going. There were some issues over the summer but she has now changed home health companies and things are much better. She infuses over the course of a few hours. She typically does her infusions in the evening. PID panel came back  positive for variants of unknown significance, but nothing too exciting at all, fortunately.   Asthma/Respiratory Symptom History: She does have coughing episodes where she has episodes and she ends up throwing up. She has been using her rescue inhaler. She is unsure whether it is the dryness in the air. She does avoid certain food triggers.    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 She is now   Otherwise, there have been no changes to her past medical history, surgical history, family history, or social history.    Review of Systems  Constitutional: Negative.  Negative for chills, fever, malaise/fatigue and weight loss.  HENT: Negative.  Negative for congestion, ear discharge and ear pain.   Eyes: Negative for pain, discharge and redness.  Respiratory: Negative for cough, sputum production, shortness of breath and wheezing.   Cardiovascular: Negative.  Negative for chest pain and palpitations.  Gastrointestinal: Negative for abdominal pain, constipation, diarrhea, heartburn, nausea and vomiting.  Skin: Negative.  Negative for itching and rash.  Neurological: Negative for dizziness and headaches.  Endo/Heme/Allergies: Negative for environmental allergies. Does not bruise/bleed easily.  All other systems reviewed and  are negative.      Objective:   Blood pressure 118/70, pulse 90, resp. rate 18, SpO2 97 %. There is no height or weight on file to calculate BMI.   Physical Exam:  Physical Exam Constitutional:      Appearance: She is well-developed.     Comments: Pleasant female. Talkative.   HENT:     Head: Normocephalic and atraumatic.     Right Ear: Tympanic membrane, ear canal and external ear normal.     Left Ear: Tympanic membrane, ear canal and external ear normal.     Nose: No nasal deformity, septal deviation, mucosal edema or rhinorrhea.     Right Sinus: No maxillary sinus tenderness or frontal sinus tenderness.     Left Sinus: No maxillary sinus tenderness or  frontal sinus tenderness.     Comments: There is dried rhinorrhea bilaterally with some dried blood.     Mouth/Throat:     Mouth: Mucous membranes are not pale and not dry.     Pharynx: Uvula midline.     Comments: Cobblestoning present in the posterior oropharynx.  Eyes:     General:        Right eye: No discharge.        Left eye: No discharge.     Conjunctiva/sclera: Conjunctivae normal.     Right eye: Right conjunctiva is not injected. No chemosis.    Left eye: Left conjunctiva is not injected. No chemosis.    Pupils: Pupils are equal, round, and reactive to light.  Cardiovascular:     Rate and Rhythm: Normal rate and regular rhythm.     Heart sounds: Normal heart sounds.  Pulmonary:     Effort: Pulmonary effort is normal. No tachypnea, accessory muscle usage or respiratory distress.     Breath sounds: Normal breath sounds. No wheezing, rhonchi or rales.     Comments: Moving air well in all lung fields.  Chest:     Chest wall: No tenderness.  Lymphadenopathy:     Cervical: No cervical adenopathy.  Skin:    Coloration: Skin is not pale.     Findings: No abrasion, erythema, petechiae or rash. Rash is not papular, urticarial or vesicular.  Neurological:     Mental Status: She is alert.  Psychiatric:        Behavior: Behavior is cooperative.      Diagnostic studies:    Spirometry: results normal (FEV1: 1.59/85%, FVC: 1.84/74%, FEV1/FVC: 86%).    Spirometry consistent with normal pattern.   Allergy Studies: none     [name], MD  [facility]

## 2020-06-27 ENCOUNTER — Encounter: Payer: Self-pay | Admitting: Allergy & Immunology

## 2020-06-29 DIAGNOSIS — Z8781 Personal history of (healed) traumatic fracture: Secondary | ICD-10-CM | POA: Insufficient documentation

## 2020-07-31 ENCOUNTER — Other Ambulatory Visit: Payer: Self-pay

## 2020-07-31 MED ORDER — PREDNISONE 10 MG PO TABS
ORAL_TABLET | ORAL | 0 refills | Status: DC
Start: 2020-07-31 — End: 2020-10-26

## 2020-08-07 ENCOUNTER — Ambulatory Visit: Payer: Medicare Other | Admitting: Cardiology

## 2020-10-26 ENCOUNTER — Other Ambulatory Visit: Payer: Self-pay

## 2020-10-26 ENCOUNTER — Encounter: Payer: Self-pay | Admitting: Allergy & Immunology

## 2020-10-26 ENCOUNTER — Ambulatory Visit (INDEPENDENT_AMBULATORY_CARE_PROVIDER_SITE_OTHER): Payer: Medicare Other | Admitting: Allergy & Immunology

## 2020-10-26 VITALS — BP 116/60 | HR 88 | Temp 98.3°F | Resp 12

## 2020-10-26 DIAGNOSIS — D638 Anemia in other chronic diseases classified elsewhere: Secondary | ICD-10-CM

## 2020-10-26 DIAGNOSIS — J310 Chronic rhinitis: Secondary | ICD-10-CM | POA: Diagnosis not present

## 2020-10-26 DIAGNOSIS — J479 Bronchiectasis, uncomplicated: Secondary | ICD-10-CM | POA: Diagnosis not present

## 2020-10-26 DIAGNOSIS — D839 Common variable immunodeficiency, unspecified: Secondary | ICD-10-CM

## 2020-10-26 DIAGNOSIS — J4550 Severe persistent asthma, uncomplicated: Secondary | ICD-10-CM | POA: Diagnosis not present

## 2020-10-26 NOTE — Patient Instructions (Addendum)
1. Common variable immunodeficiency - We will continue with the current dosing of Hyqvia.  - We will get some routine labs at the next visit.   2. Severe persistent asthma, uncomplicated - Lung function looked very normal today.   - I am so glad that you are doing so well!  - Daily controller medication(s): Breo 200/25 mcg one puff once daily - Prior to physical activity: ProAir 2 puffs 10-15 minutes before physical activity. - Rescue medications: ProAir 4 puffs every 4-6 hours as needed or albuterol nebulizer one vial every 4-6 hours as needed - Changes during respiratory infections or worsening symptoms: Add on Arnuity to one inhalation twice daily for TWO WEEKS. - Asthma control goals:  * Full participation in all desired activities (may need albuterol before activity) * Albuterol use two time or less a week on average (not counting use with activity) * Cough interfering with sleep two time or less a month * Oral steroids no more than once a year * No hospitalizations  3. Chronic rhinitis - Continue with Flonase one spray per nostril daily. - Consider using nasal saline rinses prior to using the Flonase.   4. Gastroesophageal reflux disease - Continue with omeprazole at the current dosing.  5. Return in about 6 months (around 04/27/2021).    Please inform us of any Emergency Department visits, hospitalizations, or changes in symptoms. Call us before going to the ED for breathing or allergy symptoms since we might be able to fit you in for a sick visit. Feel free to contact us anytime with any questions, problems, or concerns.  It was a pleasure to see you again today!  Websites that have reliable patient information: 1. American Academy of Asthma, Allergy, and Immunology: www.aaaai.org 2. Food Allergy Research and Education (FARE): foodallergy.org 3. Mothers of Asthmatics: http://www.asthmacommunitynetwork.org 4. American College of Allergy, Asthma, and Immunology:  www.acaai.org   COVID-19 Vaccine Information can be found at: [email or web address] For questions related to vaccine distribution or appointments, please email [email or web address] or call [phone].   We realize that you might be concerned about having an allergic reaction to the COVID19 vaccines. To help with that concern, WE ARE OFFERING THE COVID19 VACCINES IN OUR OFFICE! Ask the front desk for dates!     "Like" [place] on Facebook and Instagram for our latest updates!      A healthy democracy works best when [employer] participate! Make sure you are registered to vote! If you have moved or changed any of your contact information, you will need to get this updated before voting!  In some cases, you MAY be able to register to vote online: [email or web address]

## 2020-10-26 NOTE — Progress Notes (Signed)
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 Creta Levin  Chronic rhinitis  Gastroesophageal reflux disease- on a PPI  Chronic back pain- s/p physical therapy  Plan/Recommendations:   1. Common variable immunodeficiency - We will continue with the current dosing of Hyqvia.  - We will get some routine labs at the next visit.   2. Severe persistent asthma, uncomplicated - Lung function looked very normal today.   - I am so glad that you are doing so well!  - Daily controller medication(s): Breo 200/25 mcg one puff once daily - Prior to physical activity: ProAir 2 puffs 10-15 minutes before physical activity. - Rescue medications: ProAir 4 puffs every 4-6 hours as needed or albuterol nebulizer one vial every 4-6 hours as needed - Changes during respiratory infections or worsening symptoms: Add on Arnuity to one inhalation twice daily for TWO WEEKS. - Asthma control goals:  * Full participation in all desired activities (may need albuterol before activity) * Albuterol use two time or less a week on average (not counting use with activity) * Cough interfering with sleep two time or less a month * Oral steroids no more than once a year * No hospitalizations  3. Chronic rhinitis - Continue with Flonase one spray per nostril daily. - Consider using nasal saline rinses prior to using the Flonase.   4. Gastroesophageal reflux disease - Continue with omeprazole at the current dosing.  5. Return in about 6 months (around 04/27/2021).    Subjective:   Janet Garcia is a 75 y.o. female presenting today for follow up of  Chief Complaint  Patient presents with  . Allergic Rhinitis    . Asthma    Janet Garcia has a history of the following: Patient Active Problem List   Diagnosis Date Noted  . 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 B12 deficiency 05/10/2017  . Recurrent infections 02/24/2016  . 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/24/2016  . Severe persistent asthma 02/24/2016  . Chronic rhinitis 02/24/2016  . Anemia of chronic disease 02/24/2016  . Opacity of lung on imaging study 02/24/2016  . Moderate persistent asthma 09/15/2015  . Esophageal reflux 09/15/2015  . Essential hypertension 09/15/2015  . Abnormal liver enzymes 07/26/2015  . 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 Lumbosacral spondylosis 07/26/2015  . Asthma, mild persistent 07/05/2015  . Abnormal CAT scan 06/30/2015  . PNA (pneumonia) 06/21/2015  . Anxiety 05/23/2015  . HLD (hyperlipidemia) 03/24/2015  . 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 Family history of colonic polyps 01/13/2015  . Recurrent disease 11/21/2014  . Essential (primary) hypertension 08/15/2014  . Anxiety, generalized 08/15/2014  . Left lower lobe pneumonia 04/16/2014  . Arthritis, degenerative 04/16/2014  . Chronic infection of sinus 03/15/2014  . Bronchiectasis (HCC) 03/10/2014  . H/O respiratory system disease 03/10/2014  . Can't get food down 03/01/2014  . 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 Lumbar radiculopathy 02/28/2014  . Headache, migraine 02/28/2014  . Osteopenia 02/28/2014  . Airway hyperreactivity 02/24/2014  . Closed nondisplaced fracture of proximal phalanx of lesser toe of right foot 01/24/2014  . Allergic rhinitis 11/05/2013  . Degeneration of intervertebral disc of lumbosacral region 11/05/2013  . Esophagitis, reflux 11/05/2013    History obtained from: chart review  and patient.  Janet Garcia is a 75 y.o. female presenting for a follow up visit.  She has got the practice.  She was last seen in December 2021.  At that  time, we continued with HyQvia at the same dose.  We did discuss her genetic lab results, although there was no much to discuss since they were all mutations of unknown significance.  Her asthma was under good control.  Her lung function looked normal.  We changed her from [facility] to Trelegy because she was having an occasional cough.  We gave her a sample.  For her rhinitis, would continue with Flonase and nasal saline.  For her reflux, we continue with omeprazole at the current dosing.  Since last visit, she has done very well.  She remains on her HyQvia without any issues.  This is been a very seamless process and she has been quite happy with it.  She did contract COVID-19 in January.  Her oxygen stayed 98% or above the entire time.  She did reach out to me and I offered to refer her for a monoclonal antibody infusion.  However, she declined since she was doing so well.  He did have quite a bit of nasal congestion finally cleared with any systemic steroid from her primary care provider.  Otherwise, she has had no problems with infections at all.  She remains on the Breo 1 puff once daily.  The Trelegy made her feel "wicked".  She tells me that her lungs hurt when she used it.  She feels much better with the [facility].  The cough did not improve much with the Trelegy anyway, so she went back to what she was doing.  Prior to any systemic prednisone.  She has not been to the emergency room.  She remains on her nasal spray as well as the nasal saline rinses.  She is doing fairly well from a rhinitis standpoint.  She is on Nexium 40 mg daily.  She had a repeat endoscopy relatively recently that showed erosions from uncontrolled GERD.  At this time, she was changed from 20 mg of Nexium to 40 mg of Nexium.  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  Hers was in [place] in [place] illustration of the [place] of  [place]arts.  She has also been working on this week feels for another project.  They are quite standing.  She does show me pictures.  Her daughter is doing well.  She ended up taking a job with a [employer].  She has her nurse practitioner, midwifery certificate, and women's health certificate.  She is thoroughly enjoying this.  She remains in [place].  Her husband is doing well also.  She recently had a TIA and is now on Eliquis.  She is interested in getting the second COVID-19 booster.  She would like to get it with [place].  Otherwise, there have been no changes to her past medical history, surgical history, family history, or social history.    Review of Systems  Constitutional: Negative.  Negative for chills, fever, malaise/fatigue and weight loss.  HENT: Negative for congestion, ear discharge, ear pain and sinus pain.        Positive for throat clearing.  Eyes: Negative for pain, discharge and redness.  Respiratory: Negative for cough, sputum production, shortness of breath and wheezing.   Cardiovascular: Negative.  Negative for chest  pain and palpitations.  Gastrointestinal: Negative for abdominal pain, constipation, diarrhea, heartburn, nausea and vomiting.  Skin: Negative.  Negative for itching and rash.  Neurological: Negative for dizziness and headaches.  Endo/Heme/Allergies: Positive for environmental allergies. Does not bruise/bleed easily.       Objective:   Blood pressure 116/60, pulse 88, temperature 98.3 F (36.8 C), temperature source Temporal, resp. rate 12, SpO2 98 %. There is no height or weight on file to calculate BMI.   Physical Exam:  Physical Exam Constitutional:      Appearance: She is well-developed.     Comments: Delightful as always. Cooperative with the exam.  HENT:     Head: Normocephalic and atraumatic.     Right Ear: Tympanic membrane, ear canal and external ear normal.     Left Ear: Tympanic membrane, ear canal and external ear normal.      Nose: No nasal deformity, septal deviation, mucosal edema or rhinorrhea.     Right Turbinates: Enlarged and swollen.     Left Turbinates: Enlarged and swollen.     Right Sinus: No maxillary sinus tenderness or frontal sinus tenderness.     Left Sinus: No maxillary sinus tenderness or frontal sinus tenderness.     Mouth/Throat:     Mouth: Mucous membranes are not pale and not dry.     Pharynx: Uvula midline.  Eyes:     General:        Right eye: No discharge.        Left eye: No discharge.     Conjunctiva/sclera: Conjunctivae normal.     Right eye: Right conjunctiva is not injected. No chemosis.    Left eye: Left conjunctiva is not injected. No chemosis.    Pupils: Pupils are equal, round, and reactive to light.  Cardiovascular:     Rate and Rhythm: Normal rate and regular rhythm.     Heart sounds: Normal heart sounds.  Pulmonary:     Effort: Pulmonary effort is normal. No tachypnea, accessory muscle usage or respiratory distress.     Breath sounds: Normal breath sounds. No wheezing, rhonchi or rales.     Comments: Moving air well in all lung fields.  Chest:     Chest wall: No tenderness.  Lymphadenopathy:     Cervical: No cervical adenopathy.  Skin:    Coloration: Skin is not pale.     Findings: No abrasion, erythema, petechiae or rash. Rash is not papular, urticarial or vesicular.  Neurological:     Mental Status: She is alert.      Diagnostic studies:    Spirometry: results normal (FEV1: 1.57/91%, FVC: 1.75/75%, FEV1/FVC: 90%).    Spirometry consistent with normal pattern. Overall values are stable.   Labs ordered today: immunoglobulins, CBC with differential, metabolic panel    [name], MD  [facility]

## 2020-10-28 LAB — CMP14+EGFR
ALT: 22 IU/L (ref 0–32)
AST: 31 IU/L (ref 0–40)
Albumin/Globulin Ratio: 1.3 (ref 1.2–2.2)
Albumin: 4.2 g/dL (ref 3.7–4.7)
Alkaline Phosphatase: 94 IU/L (ref 44–121)
BUN/Creatinine Ratio: 16 (ref 12–28)
BUN: 15 mg/dL (ref 8–27)
Bilirubin Total: 0.2 mg/dL (ref 0.0–1.2)
CO2: 25 mmol/L (ref 20–29)
Calcium: 9.5 mg/dL (ref 8.7–10.3)
Chloride: 101 mmol/L (ref 96–106)
Creatinine, Ser: 0.94 mg/dL (ref 0.57–1.00)
Globulin, Total: 3.2 g/dL (ref 1.5–4.5)
Glucose: 110 mg/dL — ABNORMAL HIGH (ref 65–99)
Potassium: 4.1 mmol/L (ref 3.5–5.2)
Sodium: 142 mmol/L (ref 134–144)
Total Protein: 7.4 g/dL (ref 6.0–8.5)
eGFR: 64 mL/min/{1.73_m2} (ref >59)

## 2020-10-28 LAB — CBC WITH DIFFERENTIAL
Basophils Absolute: 0.1 10*3/uL (ref 0.0–0.2)
Basos: 1 %
EOS (ABSOLUTE): 0 10*3/uL (ref 0.0–0.4)
Eos: 1 %
Hematocrit: 37.9 % (ref 34.0–46.6)
Hemoglobin: 12.5 g/dL (ref 11.1–15.9)
Immature Grans (Abs): 0 10*3/uL (ref 0.0–0.1)
Immature Granulocytes: 0 %
Lymphocytes Absolute: 2 10*3/uL (ref 0.7–3.1)
Lymphs: 31 %
MCH: 32.6 pg (ref 26.6–33.0)
MCHC: 33 g/dL (ref 31.5–35.7)
MCV: 99 fL — ABNORMAL HIGH (ref 79–97)
Monocytes Absolute: 0.5 10*3/uL (ref 0.1–0.9)
Monocytes: 8 %
Neutrophils Absolute: 3.9 10*3/uL (ref 1.4–7.0)
Neutrophils: 59 %
RBC: 3.84 x10E6/uL (ref 3.77–5.28)
RDW: 12.8 % (ref 11.7–15.4)
WBC: 6.5 10*3/uL (ref 3.4–10.8)

## 2020-10-28 LAB — IGG, IGA, IGM
IgA/Immunoglobulin A, Serum: 447 mg/dL — ABNORMAL HIGH (ref 64–422)
IgG (Immunoglobin G), Serum: 1372 mg/dL (ref 586–1602)
IgM (Immunoglobulin M), Srm: 43 mg/dL (ref 26–217)

## 2020-11-05 ENCOUNTER — Other Ambulatory Visit: Payer: Self-pay | Admitting: Allergy & Immunology

## 2020-11-05 DIAGNOSIS — J310 Chronic rhinitis: Secondary | ICD-10-CM

## 2021-02-02 DIAGNOSIS — M8000XG Age-related osteoporosis with current pathological fracture, unspecified site, subsequent encounter for fracture with delayed healing: Secondary | ICD-10-CM | POA: Insufficient documentation

## 2021-04-21 HISTORY — PX: KYPHOPLASTY: SHX5884

## 2021-05-04 DIAGNOSIS — S22060G Wedge compression fracture of T7-T8 vertebra, subsequent encounter for fracture with delayed healing: Secondary | ICD-10-CM | POA: Insufficient documentation

## 2021-05-04 DIAGNOSIS — S22070G Wedge compression fracture of T9-T10 vertebra, subsequent encounter for fracture with delayed healing: Secondary | ICD-10-CM | POA: Insufficient documentation

## 2021-05-11 ENCOUNTER — Encounter: Payer: Self-pay | Admitting: Allergy & Immunology

## 2021-07-02 ENCOUNTER — Telehealth: Payer: Self-pay | Admitting: Allergy & Immunology

## 2021-07-02 MED ORDER — BUDESONIDE 0.5 MG/2ML IN SUSP: RESPIRATORY_TRACT | 2 refills | Status: DC

## 2021-07-02 NOTE — Telephone Encounter (Signed)
I received a call from [facility] tonight regarding current coughing/wheezing flare. She had a CXR last week on Friday that demonstrated pneumonia and she was started on doxycycline. This is day 3 or 4 of antibiotics and although she reports feeling better, she is coughing and having a hard time breathing on the phone. She is also on a 3 week prednisone taper.   I am going to send in Pulmicort neb solution to mix with albuterol and use every 6 hours as needed during period of respiratory distress. I might consider adding an antibiotic for more atypical coverage if she is not getting better tomorrow.   Because of this as well as a recent MRSA infection, she is wondering whether she needs to increase her Hyqvia dose. We have not changed that dose in several years.   [name] - let's increase to 38 grams of Hyqvia (this is around 650 mg/kg/month).   Thanks, [name], MD [facility]

## 2021-07-03 MED ORDER — HYQVIA 30 GM/300ML ~~LOC~~ KIT: 38.0000 g | PACK | SUBCUTANEOUS | 11 refills | Status: AC

## 2021-07-03 MED ORDER — HYQVIA 30 GM/300ML ~~LOC~~ KIT: 38.0000 g | PACK | SUBCUTANEOUS | 11 refills | Status: DC

## 2021-07-03 MED ORDER — AZITHROMYCIN 250 MG PO TABS: ORAL_TABLET | ORAL | 1 refills | Status: DC

## 2021-07-03 NOTE — Telephone Encounter (Signed)
Called [facility] and advised change in dose and sent new Rx to them. Called patient and advised change and will followup tieht pharmacy and hopefully get new dosage for her next infusion 12/28

## 2021-07-03 NOTE — Telephone Encounter (Signed)
Great - sounds perfect. Thanks for your help!   I called to check on her and she sounded much better. She had a better day today. She finished her nebulizer and she has some coughing after the nebulizer. She picked up the prescription and she talked to [facility] and [facility]. She has been on the doxycycline and the prednisone. She does have a follow up with Dr. Derrell Lolling on Friday.   Her next infusion is December 28th. She is all set for the larger dose during that infusion visit.   I did add an azithromycin course as well with a repeat course a few days after. This should bridge her to her increased infusion dose.   [name], MD [facility]

## 2021-07-06 ENCOUNTER — Other Ambulatory Visit: Payer: Self-pay

## 2021-07-09 ENCOUNTER — Other Ambulatory Visit: Payer: Self-pay

## 2021-07-09 DIAGNOSIS — J310 Chronic rhinitis: Secondary | ICD-10-CM

## 2021-07-09 MED ORDER — FLUTICASONE FUROATE-VILANTEROL 200-25 MCG/ACT IN AEPB: 1.0000 | INHALATION_SPRAY | Freq: Every day | RESPIRATORY_TRACT | 3 refills | Status: DC

## 2021-07-12 ENCOUNTER — Other Ambulatory Visit: Payer: Self-pay

## 2021-07-12 NOTE — Telephone Encounter (Signed)
Error

## 2021-07-18 ENCOUNTER — Other Ambulatory Visit: Payer: Self-pay

## 2021-07-26 ENCOUNTER — Other Ambulatory Visit: Payer: Self-pay

## 2021-07-26 ENCOUNTER — Ambulatory Visit (INDEPENDENT_AMBULATORY_CARE_PROVIDER_SITE_OTHER): Payer: Medicare Other | Admitting: Allergy & Immunology

## 2021-07-26 ENCOUNTER — Encounter: Payer: Self-pay | Admitting: Allergy & Immunology

## 2021-07-26 VITALS — BP 138/68 | HR 110 | Temp 98.2°F | Resp 18 | Ht 61.0 in | Wt 128.0 lb

## 2021-07-26 DIAGNOSIS — J4550 Severe persistent asthma, uncomplicated: Secondary | ICD-10-CM | POA: Diagnosis not present

## 2021-07-26 DIAGNOSIS — J310 Chronic rhinitis: Secondary | ICD-10-CM

## 2021-07-26 DIAGNOSIS — J479 Bronchiectasis, uncomplicated: Secondary | ICD-10-CM | POA: Diagnosis not present

## 2021-07-26 DIAGNOSIS — D839 Common variable immunodeficiency, unspecified: Secondary | ICD-10-CM

## 2021-07-26 NOTE — Patient Instructions (Addendum)
1. Common variable immunodeficiency - We will continue with the current dosing of Hyqvia (40 grams each time).  - We will get some routine labs today.  2. Severe persistent asthma, uncomplicated - Lung function looked decent ish.  - I would add the Acapella device 2-3 times daily (use an albuterol/Pulmicort neb BEFORE the Acapella device to open things up). - Do the Acapella for four weeks. - We are going to refer you to see someone at [facility] (I will talk to [facility] our referral coordinator to make sure it goes to the right person).  - Daily controller medication(s): Breo 200/25 mcg one puff once daily - Prior to physical activity: ProAir 2 puffs 10-15 minutes before physical activity. - Rescue medications: ProAir 4 puffs every 4-6 hours as needed or albuterol nebulizer one vial every 4-6 hours as needed - Changes during respiratory infections or worsening symptoms: Add on Arnuity to  one inhalation  twice daily for TWO WEEKS. - Asthma control goals:  * Full participation in all desired activities (may need albuterol before activity) * Albuterol use two time or less a week on average (not counting use with activity) * Cough interfering with sleep two time or less a month * Oral steroids no more than once a year * No hospitalizations  3. Chronic rhinitis - Continue with Flonase one spray per nostril daily. - Consider using nasal saline rinses prior to using the Flonase.   4. Gastroesophageal reflux disease - Continue with omeprazole at the current dosing.  5. Return in about 3 months (around 10/24/2021).    Please inform us of any Emergency Department visits, hospitalizations, or changes in symptoms. Call us before going to the ED for breathing or allergy symptoms since we might be able to fit you in for a sick visit. Feel free to contact us anytime with any questions, problems, or concerns.  It was a pleasure to see you again today!  Websites that have reliable patient  information: 1. American Academy of Asthma, Allergy, and Immunology: www.aaaai.org 2. Food Allergy Research and Education (FARE): foodallergy.org 3. Mothers of Asthmatics: http://www.asthmacommunitynetwork.org 4. American College of Allergy, Asthma, and Immunology: www.acaai.org   COVID-19 Vaccine Information can be found at: [email or web address] For questions related to vaccine distribution or appointments, please email [email or web address] or call [phone].   We realize that you might be concerned about having an allergic reaction to the COVID19 vaccines. To help with that concern, WE ARE OFFERING THE COVID19 VACCINES IN OUR OFFICE! Ask the front desk for dates!     Like [place] on [employer] and Instagram for our latest updates!      A healthy democracy works best when [employer] participate! Make sure you are registered to vote! If you have moved or changed any of your contact information, you will need to get this updated before voting!  In some cases, you MAY be able to register to vote online: [email or web address]

## 2021-07-26 NOTE — Progress Notes (Signed)
FOLLOW UP  Date of Service/Encounter:  07/26/21   Assessment:   Severe persistent asthma without complication   Common variable immunodeficiency - on HyQvia every three weeks (recently increased dose of 40 grams every 3 weeks December 2022)   Anemia of chronic disease   B12 deficiency - improved on B12 supplementation   Bronchiectasis without complication - previously followed by Dr. Su Monks    Chronic rhinitis   Gastroesophageal reflux disease - on a PPI   Chronic back pain - s/p kyphoplasty October 2022  Fatigue - likely related to recent PNA and back surgery (consider larger workup at the next visit)    Janet Garcia presents for a follow-up visit.  Unfortunately, she has had a rough 4 to 5 months.  She has required about 5 courses of antibiotics as well as callus courses of prednisone for breathing.  She has finally had a normal chest x-ray, which is reassuring.  However, she endorses continued mucus production which she feels is definitely from her lungs.  She has a history of bronchiectasis, but this has been quiescent for a couple of years now.  She has not been using her Acapella device, so I recommended that she go ahead and restart that 2-3 times a day after an albuterol nebulizer treatment.  Hopefully this will help with mucus clearance.  She always does feel better mood, sleep appointment will keep her feeling good.  I think she might benefit from a high-res chest CT, but we are going to refer her to [facility] to establish care with them first.  They might feel that an HRCT is not necessary and I would like to spare the radiation exposure.  They recently increased her immunoglobulin dosing due to her breakthrough pneumonia and MRSA infection.  Originally, I felt that the MRSA infection was a normal postop complication but this combined with the fairly severe pneumonia may need to reconsider; instead I chose to increase the immunoglobulin.  We are can get some labs today to  check her IgG level as well as a CBC and metabolic panel.  If her pulmonary issues continue to be a problem, we could consider adding on a biologic, but a CBC should help with that decision making.    Plan/Recommendations:   1. Common variable immunodeficiency - We will continue with the current dosing of Hyqvia (40 grams each time).  - We will get some routine labs today.  2. Severe persistent asthma, uncomplicated - Lung function looked decent ish.  - I would add the Acapella device 2-3 times daily (use an albuterol/Pulmicort neb BEFORE the Acapella device to open things up). - Do the Acapella for four weeks. - We are going to refer you to see someone at [facility] (I will talk to [facility] our referral coordinator to make sure it goes to the right person).  - Daily controller medication(s): Breo 200/25 mcg one puff once daily - Prior to physical activity: ProAir 2 puffs 10-15 minutes before physical activity. - Rescue medications: ProAir 4 puffs every 4-6 hours as needed or albuterol nebulizer one vial every 4-6 hours as needed - Changes during respiratory infections or worsening symptoms: Add on Arnuity to  one inhalation  twice daily for TWO WEEKS. - Asthma control goals:  * Full participation in all desired activities (may need albuterol before activity) * Albuterol use two time or less a week on average (not counting use with activity) * Cough interfering with sleep two time or less a month *  Oral steroids no more than once a year * No hospitalizations  3. Chronic rhinitis - Continue with Flonase one spray per nostril daily. - Consider using nasal saline rinses prior to using the Flonase.   4. Gastroesophageal reflux disease - Continue with omeprazole at the current dosing.  5. Return in about 3 months (around 10/24/2021).   Subjective:   Janet Garcia is a 76 y.o. 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/2019   B12 deficiency 05/10/2017   Recurrent infections 02/24/2016   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/24/2016   Severe persistent asthma 02/24/2016   Chronic rhinitis 02/24/2016   Anemia of chronic disease 02/24/2016   Opacity of lung on imaging study 02/24/2016   Moderate persistent asthma 09/15/2015   Esophageal reflux 09/15/2015   Essential hypertension 09/15/2015   Abnormal liver enzymes 07/26/2015   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   H/O total hip arthroplasty 07/26/2015   Lumbosacral spondylosis 07/26/2015   Asthma, mild persistent 07/05/2015   Abnormal CAT scan 06/30/2015   PNA (pneumonia) 06/21/2015   Anxiety 05/23/2015   HLD (hyperlipidemia) 03/24/2015   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   Family history of colonic polyps 01/13/2015   Recurrent disease 11/21/2014   Essential (primary) hypertension 08/15/2014   Anxiety, generalized 08/15/2014   Left lower lobe pneumonia 04/16/2014   Arthritis, degenerative 04/16/2014   Chronic infection of sinus 03/15/2014   Bronchiectasis (HCC) 03/10/2014   H/O respiratory system disease 03/10/2014   Can't get food down 03/01/2014   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  Janet Garcia is a 76 y.o. female presenting for a follow up visit.  I saw her in April 2022.  At that time, we continued with her dosing of immunoglobulin.  She was receiving HyQvia  every 3 weeks.  Her lung function looked very good.  We continued with Breo 200 mcg 1 puff once daily as well as albuterol as needed.  We added on Arnuity during flares.  For her rhinitis, we continue with Flonase and nasal saline rinses.  GERD was controlled with omeprazole.  In the interim, I talked to her in mid December.  She was having a prolonged illness with pneumonia.  She was already on doxycycline when I talked to her as well as prednisone.  I sent in Pulmicort mixed with albuterol to use every 6 hours as needed during respiratory distress.  We also decided to increase her immunoglobulin to 38 g (650 mg/kg/month).  This is been approved.  We added a course of azithromycin to cover for atypical infections.  Since I talked her at the end of December, she has gotten slowly better.  She had a clean X-ray last Friday. She reports that she is still feeling very tired from the entire experience. She is still feeling that she has a lot of nasal congestion and pressure. She had COVID testing and flu testing done twice and this was completely  negative. She did some home testing and this was negative as well. When she gets to coughing with talking/eating/drinking, she is having problems with choking until she brings up a glob of "stuff".  He was referred to see a pulmonologist, however she would prefer to see someone that I am more comfortable with.   The only thing that has stopped the cough was the prednisone.  She has been using dextromethorphan as well to help control the cough.  She stopped her prednisone for two days and got worse. She found some old prednisone prescription and she took that for the six days. She finished this yesterday. She also had a steroid injection. This was Dr. Derrell Lolling.  She credits Dr. Derrell Lolling and myself for keeping her out of the hospital.  She has very thankful for that.  She is using her Afrin and she reports that she is always feeling the pressure. She is feeling that it is coming  "up, not down", meaning she feels that it is coming from her lungs.  She has been using her nebulizer quite a bit and has been using her rescue inhaler.  It transiently helps if at all.  Immunoglobulin dosing has started.  She is receiving it every 3 weeks over 3 hours, which is relatively similar to what it was before.  She has not had any side effects from this.  Her back has been healing fairly well.  She always remains upbeat about this.   She has been very tired lately.  She attributes that to her pneumonia episode which is lasted around 6 weeks.  She also was tired before that after her back surgery.  She tells me she has broken down a few times, but tries to remain positive about things.  She has not been able to do things that she typically enjoys, including her rugs hooking.  Otherwise, there have been no changes to her past medical history, surgical history, family history, or social history.    Review of Systems  Constitutional:  Positive for malaise/fatigue. Negative for chills, fever and weight loss.  HENT: Negative.  Negative for congestion, ear discharge and ear pain.   Eyes:  Negative for pain, discharge and redness.  Respiratory:  Positive for cough. Negative for sputum production, shortness of breath and wheezing.        Positive for hoarseness.  Cardiovascular: Negative.  Negative for chest pain and palpitations.  Gastrointestinal:  Negative for abdominal pain, constipation, diarrhea, heartburn, nausea and vomiting.  Skin: Negative.  Negative for itching and rash.  Neurological:  Negative for dizziness and headaches.  Endo/Heme/Allergies:  Negative for environmental allergies. Does not bruise/bleed easily.      Objective:   Blood pressure 138/68, pulse (!) 110, temperature 98.2 F (36.8 C), temperature source Temporal, resp. rate 18, height 5\' 1"  (1.549 m), weight 128 lb (58.1 kg), SpO2 97 %. Body mass index is 24.19 kg/m.   Physical Exam:  Physical Exam Vitals  reviewed.  Constitutional:      Appearance: She is well-developed.     Comments: Delightful as always. Cooperative with the exam. Seems more tired than normal, for sure.   HENT:     Head: Normocephalic and atraumatic.     Right Ear: Tympanic membrane, ear canal and external ear normal.     Left Ear: Tympanic membrane, ear canal and external ear normal.     Nose: No nasal deformity, septal deviation, mucosal edema or rhinorrhea.     Right Turbinates: Enlarged, swollen  and pale.     Left Turbinates: Enlarged, swollen and pale.     Right Sinus: No maxillary sinus tenderness or frontal sinus tenderness.     Left Sinus: No maxillary sinus tenderness or frontal sinus tenderness.     Mouth/Throat:     Mouth: Mucous membranes are not pale and not dry.     Pharynx: Uvula midline.  Eyes:     General:        Right eye: No discharge.        Left eye: No discharge.     Conjunctiva/sclera: Conjunctivae normal.     Right eye: Right conjunctiva is not injected. No chemosis.    Left eye: Left conjunctiva is not injected. No chemosis.    Pupils: Pupils are equal, round, and reactive to light.  Cardiovascular:     Rate and Rhythm: Normal rate and regular rhythm.     Heart sounds: Normal heart sounds.  Pulmonary:     Effort: Pulmonary effort is normal. No tachypnea, accessory muscle usage or respiratory distress.     Breath sounds: Normal breath sounds. No wheezing, rhonchi or rales.     Comments: Moving air well in all lung fields. There are some faint rhonchi bilaterally. No crackles.  Chest:     Chest wall: No tenderness.  Lymphadenopathy:     Cervical: No cervical adenopathy.  Skin:    Coloration: Skin is not pale.     Findings: No abrasion, erythema, petechiae or rash. Rash is not papular, urticarial or vesicular.  Neurological:     Mental Status: She is alert.  Psychiatric:        Behavior: Behavior is cooperative.     Diagnostic studies: none (labs ordered)  Malachi BondsJoel Krue Peterka, MD   [facility]

## 2021-07-27 ENCOUNTER — Encounter: Payer: Self-pay | Admitting: Allergy & Immunology

## 2021-07-28 LAB — CMP14+EGFR
ALT: 18 IU/L (ref 0–32)
AST: 25 IU/L (ref 0–40)
Albumin/Globulin Ratio: 1.2 (ref 1.2–2.2)
Albumin: 3.7 g/dL (ref 3.7–4.7)
Alkaline Phosphatase: 64 IU/L (ref 44–121)
BUN/Creatinine Ratio: 11 — ABNORMAL LOW (ref 12–28)
BUN: 8 mg/dL (ref 8–27)
Bilirubin Total: 0.2 mg/dL (ref 0.0–1.2)
CO2: 27 mmol/L (ref 20–29)
Calcium: 9.3 mg/dL (ref 8.7–10.3)
Chloride: 102 mmol/L (ref 96–106)
Creatinine, Ser: 0.71 mg/dL (ref 0.57–1.00)
Globulin, Total: 3.1 g/dL (ref 1.5–4.5)
Glucose: 85 mg/dL (ref 70–99)
Potassium: 3.9 mmol/L (ref 3.5–5.2)
Sodium: 141 mmol/L (ref 134–144)
Total Protein: 6.8 g/dL (ref 6.0–8.5)
eGFR: 89 mL/min/{1.73_m2} (ref >59)

## 2021-07-28 LAB — CBC WITH DIFFERENTIAL/PLATELET
Basophils Absolute: 0 10*3/uL (ref 0.0–0.2)
Basos: 0 %
EOS (ABSOLUTE): 0.2 10*3/uL (ref 0.0–0.4)
Eos: 2 %
Hematocrit: 32.6 % — ABNORMAL LOW (ref 34.0–46.6)
Hemoglobin: 11.3 g/dL (ref 11.1–15.9)
Immature Grans (Abs): 0 10*3/uL (ref 0.0–0.1)
Immature Granulocytes: 0 %
Lymphocytes Absolute: 1.5 10*3/uL (ref 0.7–3.1)
Lymphs: 21 %
MCH: 33.8 pg — ABNORMAL HIGH (ref 26.6–33.0)
MCHC: 34.7 g/dL (ref 31.5–35.7)
MCV: 98 fL — ABNORMAL HIGH (ref 79–97)
Monocytes Absolute: 0.5 10*3/uL (ref 0.1–0.9)
Monocytes: 7 %
Neutrophils Absolute: 5 10*3/uL (ref 1.4–7.0)
Neutrophils: 70 %
Platelets: 263 10*3/uL (ref 150–450)
RBC: 3.34 x10E6/uL — ABNORMAL LOW (ref 3.77–5.28)
RDW: 14 % (ref 11.7–15.4)
WBC: 7.2 10*3/uL (ref 3.4–10.8)

## 2021-07-28 LAB — IGG, IGA, IGM
IgA/Immunoglobulin A, Serum: 307 mg/dL (ref 64–422)
IgG (Immunoglobin G), Serum: 1357 mg/dL (ref 586–1602)
IgM (Immunoglobulin M), Srm: 44 mg/dL (ref 26–217)

## 2021-08-02 NOTE — Progress Notes (Signed)
Patient is scheduled for 08/22/2021 @ 2:30 with Dr Vaughan Browner. Patient has been informed of this information.

## 2021-08-22 ENCOUNTER — Other Ambulatory Visit: Payer: Self-pay

## 2021-08-22 ENCOUNTER — Encounter: Payer: Self-pay | Admitting: Pulmonary Disease

## 2021-08-22 ENCOUNTER — Ambulatory Visit (INDEPENDENT_AMBULATORY_CARE_PROVIDER_SITE_OTHER): Payer: Medicare Other | Admitting: Pulmonary Disease

## 2021-08-22 VITALS — BP 124/78 | HR 107 | Temp 98.2°F | Ht 61.0 in | Wt 127.8 lb

## 2021-08-22 DIAGNOSIS — J479 Bronchiectasis, uncomplicated: Secondary | ICD-10-CM

## 2021-08-22 NOTE — Progress Notes (Addendum)
Janet Garcia    [id number]    07-Jan-1946  Primary Care Physician:Payne, Lu Duffel, PA-C  Referring Physician: Valentina Shaggy, MD [address] [employer],  [place] [address]  Chief complaint: Consult for bronchiectasis medication, asthma  HPI: 76 year old with history of severe persistent asthma, common variable immunodeficiency, GERD She follows with Dr. Ernst Bowler and is on immunoglobulin subcutaneous injections.  She has history of bronchiectasis previously followed by Dr. Camillo Flaming.  She has been referred for evaluation of bronchiectasis Complains of occasional productive cough, dyspnea with wheezing.  She has indigestion from GERD.  Pets: Cats Occupation: A Neurosurgeon Exposures: No mold, hot tub, Jacuzzi.  No feather pillows or comforters Smoking history: Never smoker Travel history: Previously lived in [place] Relevant family history: No family history of lung disease   Outpatient Encounter Medications as of 08/22/2021  Medication Sig   albuterol (PROAIR HFA) 108 (90 Base) MCG/ACT inhaler Inhale 2 puffs into the lungs every 4 (four) hours as needed.   albuterol (PROVENTIL) (2.5 MG/3ML) 0.083% nebulizer solution    ALPRAZolam (XANAX) 0.25 MG tablet Take 0.25 mg by mouth.   Artificial Tear Solution (SYSTANE CONTACTS OP) Apply to eye.   aspirin 81 MG EC tablet Take 81 mg by mouth daily.   atorvastatin (LIPITOR) 20 MG tablet Take 20 mg by mouth.   BREO ELLIPTA 200-25 MCG/INH AEPB INHALE 1 PUFF INTO THE LUNGS DAILY   budesonide (PULMICORT) 0.5 MG/2ML nebulizer solution Use one vial mixed with an albuterol nebulizer vial every 6 hours during periods of respiratory distress for 1-2 weeks at a time.   Calcium Carbonate-Vitamin D 600-200 MG-UNIT TABS Take 2,000 tablets by mouth.    Coenzyme Q10 (CO Q 10 PO) Take by mouth daily.   denosumab (PROLIA) 60 MG/ML SOSY injection Prolia   diltiazem (CARDIZEM CD) 120 MG 24 hr capsule TAKE 1  CAPSULE(120 MG) BY MOUTH DAILY. FOLLOW UP APPOINTMENT   Esomeprazole Magnesium (NEXIUM 24HR PO) Take by mouth.   ibuprofen (ADVIL,MOTRIN) 200 MG tablet Take 200 mg by mouth.   Immune Globulin-Hyaluronidase (HYQVIA) 30 GM/300ML KIT Inject 38 g into the skin every 21 ( twenty-one) days.   loratadine (CLARITIN) 10 MG tablet Take by mouth.   losartan (COZAAR) 25 MG tablet Take by mouth.    montelukast (SINGULAIR) 10 MG tablet    Multiple Vitamin (MULTIVITAMIN) tablet Take by mouth.   polyethylene glycol powder (GLYCOLAX/MIRALAX) 17 GM/SCOOP powder Take by mouth.   sertraline (ZOLOFT) 50 MG tablet Take by mouth.   vitamin B-12 (CYANOCOBALAMIN) 1000 MCG tablet Take by mouth.   EPIPEN 2-PAK 0.3 MG/0.3ML SOAJ injection Inject 0.3 mLs (0.3 mg total) into the muscle as needed for anaphylaxis. (Patient not taking: Reported on 08/22/2021)   omeprazole (PRILOSEC) 20 MG capsule  (Patient not taking: Reported on 07/26/2021)   [DISCONTINUED] azithromycin (ZITHROMAX) 250 MG tablet Take two tablets on day one and one tablet daily for four more days. Repeat the same two days after completing the first course.   [DISCONTINUED] diltiazem (TIAZAC) 120 MG 24 hr capsule Take by mouth.   [DISCONTINUED] fluticasone furoate-vilanterol (BREO ELLIPTA) 200-25 MCG/ACT AEPB Inhale 1 puff into the lungs daily.   [DISCONTINUED] Immune Globulin-Hyaluronidase 30 GM/300ML KIT Inject into the skin. q 3 weeks   No facility-administered encounter medications on file as of 08/22/2021.    Allergies as of 08/22/2021 - Review Complete 08/22/2021  Allergen Reaction Noted   Fish  allergy Anaphylaxis 07/29/2019   Guaifenesin Other (See Comments) 09/14/2015   Guaifenesin er  11/25/2013   Levofloxacin Other (See Comments) 03/15/2014   Codeine Nausea Only 09/14/2015   Elemental sulfur Nausea Only 09/14/2015   Erythromycin Nausea Only 09/14/2015   Sulfa antibiotics Nausea Only 09/14/2015    Past Medical History:  Diagnosis Date    Bronchiectasis ([place])    Immune deficiency disorder ([place])    Pneumonia     Past Surgical History:  Procedure Laterality Date   TOTAL HIP ARTHROPLASTY Right     Family History  Problem Relation Age of Onset   Hypertension Mother    Allergic rhinitis Neg Hx    Angioedema Neg Hx    Asthma Neg Hx    Eczema Neg Hx    Immunodeficiency Neg Hx    Urticaria Neg Hx     Social History   Socioeconomic History   Marital status: Married    Spouse name: Not on file   Number of children: Not on file   Years of education: Not on file   Highest education level: Not on file  Occupational History   Not on file  Tobacco Use   Smoking status: Never   Smokeless tobacco: Never  Vaping Use   Vaping Use: Never used  Substance and Sexual Activity   Alcohol use: No   Drug use: No   Sexual activity: Yes  Other Topics Concern   Not on file  Social History Narrative   Not on file   Social Determinants of Health   Financial Resource Strain: Not on file  Food Insecurity: Not on file  Transportation Needs: Not on file  Physical Activity: Not on file  Stress: Not on file  Social Connections: Not on file  Intimate Partner Violence: Not on file    Review of systems: Review of Systems  Constitutional: Negative for fever and chills.  HENT: Negative.   Eyes: Negative for blurred vision.  Respiratory: as per HPI  Cardiovascular: Negative for chest pain and palpitations.  Gastrointestinal: Negative for vomiting, diarrhea, blood per rectum. Genitourinary: Negative for dysuria, urgency, frequency and hematuria.  Musculoskeletal: Negative for myalgias, back pain and joint pain.  Skin: Negative for itching and rash.  Neurological: Negative for dizziness, tremors, focal weakness, seizures and loss of consciousness.  Endo/Heme/Allergies: Negative for environmental allergies.  Psychiatric/Behavioral: Negative for depression, suicidal ideas and hallucinations.  All other systems reviewed and are  negative.  Physical Exam: Blood pressure 124/78, pulse (!) 107, temperature 98.2 F (36.8 C), temperature source Oral, height _0  (1.549 m), weight 127 lb 12.8 oz (58 kg), SpO2 96 %. Gen:      No acute distress HEENT:  EOMI, sclera anicteric Neck:     No masses; no thyromegaly Lungs:    Clear to auscultation bilaterally; normal respiratory effort CV:         Regular rate and rhythm; no murmurs Abd:      + bowel sounds; soft, non-tender; no palpable masses, no distension Ext:    No edema; adequate peripheral perfusion Skin:      Warm and dry; no rash Neuro: alert and oriented x 3 Psych: normal mood and affect  Data Reviewed: Imaging: CT chest report [facility] 02/26/2016- 1. Mild subpleural reticular densities and bronchiectasis at the  lung bases, right greater than left, possibly postinflammatory in  etiology. Difficult to exclude nonspecific interstitial pneumonitis.  2. Aortic atherosclerosis and coronary artery calcification.   CT high-resolution 06/25/2015.  Report from [facility]  health Mild to moderate cylindrical bronchiectasis in lingula and bilateral lower lobes.  Patchy consolidation and groundglass in lingula and bilateral lower lobes. Fine groundglass centrilobular nodularity in the upper lungs   PFTs: Spirometry 10/26/2020 FVC 1.75 [75%], FEV1 1.57 [91%], F/F 19 No obstruction, restriction possible  Labs:  Assessment:  Evaluation for bronchiectasis She has history of bronchiectasis which is likely from her chronic immunodeficiency Schedule high-res CT and PFTs for further evaluation Continue Acapella device  Severe persistent asthma On Breo, ProAir. Arnuity add-on started by Dr. Ernst Bowler recently  GERD Continue omeprazole  Chronic rhinitis Continue Flonase  Plan/Recommendations: High-res CT, PFTs Mucociliary clearance with Acapella Continue inhalers for asthma  Marshell Garfinkel MD [facility] 08/22/2021, 2:39 PM  CC: Valentina Shaggy, *

## 2021-08-22 NOTE — Patient Instructions (Signed)
We will get high-res CT for better evaluation of your lungs Schedule PFTs Follow-up in clinic after PFTs in 1 to 2 months

## 2021-08-23 ENCOUNTER — Telehealth (HOSPITAL_BASED_OUTPATIENT_CLINIC_OR_DEPARTMENT_OTHER): Payer: Self-pay

## 2021-08-24 ENCOUNTER — Encounter: Payer: Self-pay | Admitting: Pulmonary Disease

## 2021-08-25 ENCOUNTER — Other Ambulatory Visit: Payer: Self-pay

## 2021-08-25 ENCOUNTER — Ambulatory Visit (HOSPITAL_BASED_OUTPATIENT_CLINIC_OR_DEPARTMENT_OTHER)
Admission: RE | Admit: 2021-08-25 | Discharge: 2021-08-25 | Disposition: A | Discharge status: Home / Self Care | Payer: Medicare Other | Source: Ambulatory Visit | Attending: Pulmonary Disease | Admitting: Pulmonary Disease

## 2021-08-25 DIAGNOSIS — J479 Bronchiectasis, uncomplicated: Secondary | ICD-10-CM | POA: Insufficient documentation

## 2021-09-24 ENCOUNTER — Telehealth: Payer: Self-pay | Admitting: Pulmonary Disease

## 2021-09-24 ENCOUNTER — Other Ambulatory Visit: Payer: Self-pay | Admitting: *Deleted

## 2021-09-24 DIAGNOSIS — J849 Interstitial pulmonary disease, unspecified: Secondary | ICD-10-CM

## 2021-09-24 NOTE — Telephone Encounter (Signed)
See 09/14/21 HRCT result note. ?

## 2021-09-25 ENCOUNTER — Other Ambulatory Visit (INDEPENDENT_AMBULATORY_CARE_PROVIDER_SITE_OTHER): Payer: Medicare Other

## 2021-09-25 DIAGNOSIS — J849 Interstitial pulmonary disease, unspecified: Secondary | ICD-10-CM

## 2021-09-25 NOTE — Addendum Note (Signed)
Addended by: Rosita Kea on: 09/25/2021 01:42 PM ? ? Modules accepted: Orders ? ?

## 2021-09-26 ENCOUNTER — Other Ambulatory Visit: Payer: Medicare Other

## 2021-09-26 ENCOUNTER — Telehealth: Payer: Self-pay

## 2021-09-26 DIAGNOSIS — J849 Interstitial pulmonary disease, unspecified: Secondary | ICD-10-CM

## 2021-09-26 NOTE — Progress Notes (Signed)
No charge. Pt here for redraw. 

## 2021-09-26 NOTE — Telephone Encounter (Signed)
Left voicemail for patient to return call to schedule appointment for recollect on lab orders are in for future.  ?

## 2021-09-26 NOTE — Addendum Note (Signed)
Addended by: Rosita Kea on: 09/26/2021 08:23 AM ? ? Modules accepted: Orders ? ?

## 2021-09-28 LAB — HYPERSENSITIVITY PNEUMONITIS
A. Pullulans Abs: NEGATIVE
A.Fumigatus #1 Abs: NEGATIVE
Micropolyspora faeni, IgG: NEGATIVE
Pigeon Serum Abs: NEGATIVE
Thermoact. Saccharii: NEGATIVE
Thermoactinomyces vulgaris, IgG: NEGATIVE

## 2021-09-30 LAB — ANTI-SCLERODERMA ANTIBODY: Scleroderma (Scl-70) (ENA) Antibody, IgG: <1 AI

## 2021-09-30 LAB — ANTI-NUCLEAR AB-TITER (ANA TITER): ANA Titer 1: 1:40 {titer} — ABNORMAL HIGH

## 2021-09-30 LAB — ANA,IFA RA DIAG PNL W/RFLX TIT/PATN
Anti Nuclear Antibody (ANA): POSITIVE — AB
Cyclic Citrullin Peptide Ab: <16 UNITS
Rheumatoid fact SerPl-aCnc: <14 IU/mL (ref <14)

## 2021-09-30 LAB — ANCA SCREEN W REFLEX TITER: ANCA SCREEN: NEGATIVE

## 2021-09-30 LAB — SJOGREN'S SYNDROME ANTIBODS(SSA + SSB)
SSA (Ro) (ENA) Antibody, IgG: <1 AI
SSB (La) (ENA) Antibody, IgG: <1 AI

## 2021-10-08 ENCOUNTER — Encounter: Payer: Self-pay | Admitting: Pulmonary Disease

## 2021-10-08 ENCOUNTER — Ambulatory Visit (INDEPENDENT_AMBULATORY_CARE_PROVIDER_SITE_OTHER): Payer: Medicare Other | Admitting: Pulmonary Disease

## 2021-10-08 ENCOUNTER — Other Ambulatory Visit: Payer: Self-pay

## 2021-10-08 VITALS — BP 130/70 | HR 96 | Temp 98.3°F | Ht 60.0 in | Wt 123.2 lb

## 2021-10-08 DIAGNOSIS — J479 Bronchiectasis, uncomplicated: Secondary | ICD-10-CM

## 2021-10-08 DIAGNOSIS — J849 Interstitial pulmonary disease, unspecified: Secondary | ICD-10-CM

## 2021-10-08 DIAGNOSIS — J4550 Severe persistent asthma, uncomplicated: Secondary | ICD-10-CM | POA: Diagnosis not present

## 2021-10-08 LAB — PULMONARY FUNCTION TEST
DL/VA % pred: 99 %
DL/VA: 4.2 ml/min/mmHg/L
DLCO unc % pred: 57 %
DLCO unc: 9.47 ml/min/mmHg
FEF 25-75 Pre: 0.9 L/sec
FEF2575-%Pred-Pre: 62 %
FEV1-%Pred-Pre: 68 %
FEV1-Pre: 1.18 L
FEV1FVC-%Pred-Pre: 93 %
FEV6-%Pred-Pre: 74 %
FEV6-Pre: 1.65 L
FEV6FVC-%Pred-Pre: 104 %
FVC-%Pred-Pre: 72 %
FVC-Pre: 1.68 L
Pre FEV1/FVC ratio: 70 %
Pre FEV6/FVC Ratio: 99 %
RV % pred: 60 %
RV: 1.26 L
TLC % pred: 65 %
TLC: 2.92 L

## 2021-10-08 MED ORDER — PREDNISONE 20 MG PO TABS: ORAL_TABLET | ORAL | 0 refills | Status: DC

## 2021-10-08 MED ORDER — AZITHROMYCIN 250 MG PO TABS: ORAL_TABLET | ORAL | 0 refills | Status: DC

## 2021-10-08 NOTE — Progress Notes (Signed)
? ?      ?Blaire Palomino    [id number]    23-Sep-1945 ? ?Primary Care Physician:Payne, Lu Duffel, PA-C ? ?Referring Physician: [name], PA-C ?[address] ?[address] ?[place],  [place] [address] ? ?Chief complaint: Follow-up for bronchiectasis medication, asthma ? ?HPI: ?76 year old with history of severe persistent asthma, common variable immunodeficiency, GERD ?She follows with Dr. Ernst Bowler and is on immunoglobulin subcutaneous injections.  She has history of bronchiectasis previously followed by Dr. Camillo Flaming.  She has been referred for evaluation of bronchiectasis ?Complains of occasional productive cough, dyspnea with wheezing.  She has indigestion from GERD. ? ?Pets: Cats ?Occupation: A Neurosurgeon ?Exposures: No mold, hot tub, Jacuzzi.  No feather pillows or comforters ?Smoking history: Never smoker ?Travel history: Previously lived in [place] ?Relevant family history: No family history of lung disease ? ?Interim history: ?She is here for review of CT and PFTs ?States that she has sinus congestion, productive cough with yellow mucus for the past few days.  Breathing is stable with no increase in dyspnea on exertion ? ? ?Outpatient Encounter Medications as of 10/08/2021  ?Medication Sig  ? albuterol (PROAIR HFA) 108 (90 Base) MCG/ACT inhaler Inhale 2 puffs into the lungs every 4 (four) hours as needed.  ? albuterol (PROVENTIL) (2.5 MG/3ML) 0.083% nebulizer solution   ? ALPRAZolam (XANAX) 0.25 MG tablet Take 0.25 mg by mouth.  ? Artificial Tear Solution (SYSTANE CONTACTS OP) Apply to eye.  ? aspirin 81 MG EC tablet Take 81 mg by mouth daily.  ? atorvastatin (LIPITOR) 20 MG tablet Take 20 mg by mouth.  ? BREO ELLIPTA 200-25 MCG/INH AEPB INHALE 1 PUFF INTO THE LUNGS DAILY  ? budesonide (PULMICORT) 0.5 MG/2ML nebulizer solution Use one vial mixed with an albuterol nebulizer vial every 6 hours during periods of respiratory distress for 1-2 weeks at a time.  ? Calcium  Carbonate-Vitamin D 600-200 MG-UNIT TABS Take 2,000 tablets by mouth.   ? Coenzyme Q10 (CO Q 10 PO) Take by mouth daily.  ? denosumab (PROLIA) 60 MG/ML SOSY injection Prolia  ? diltiazem (CARDIZEM CD) 120 MG 24 hr capsule TAKE 1 CAPSULE(120 MG) BY MOUTH DAILY. FOLLOW UP APPOINTMENT  ? EPIPEN 2-PAK 0.3 MG/0.3ML SOAJ injection Inject 0.3 mLs (0.3 mg total) into the muscle as needed for anaphylaxis.  ? Esomeprazole Magnesium (NEXIUM 24HR PO) Take by mouth.  ? ibuprofen (ADVIL,MOTRIN) 200 MG tablet Take 200 mg by mouth.  ? Immune Globulin-Hyaluronidase (HYQVIA) 30 GM/300ML KIT Inject 38 g into the skin every 21 ( twenty-one) days.  ? loratadine (CLARITIN) 10 MG tablet Take by mouth.  ? losartan (COZAAR) 25 MG tablet Take by mouth.   ? montelukast (SINGULAIR) 10 MG tablet   ? Multiple Vitamin (MULTIVITAMIN) tablet Take by mouth.  ? omeprazole (PRILOSEC) 20 MG capsule   ? polyethylene glycol powder (GLYCOLAX/MIRALAX) 17 GM/SCOOP powder Take by mouth.  ? sertraline (ZOLOFT) 50 MG tablet Take by mouth.  ? vitamin B-12 (CYANOCOBALAMIN) 1000 MCG tablet Take by mouth.  ? ?No facility-administered encounter medications on file as of 10/08/2021.  ? ? ?Physical Exam: ?Blood pressure 130/70, pulse 96, temperature 98.3 ?F (36.8 ?C), temperature source Oral, height 5' (1.524 m), weight 123 lb 3.2 oz (55.9 kg), SpO2 97 %. ?Gen:      No acute distress ?HEENT:  EOMI, sclera anicteric ?Neck:     No masses; no thyromegaly ?Lungs:    Clear to auscultation bilaterally; normal respiratory effort ?CV:  Regular rate and rhythm; no murmurs ?Abd:      + bowel sounds; soft, non-tender; no palpable masses, no distension ?Ext:    No edema; adequate peripheral perfusion ?Skin:      Warm and dry; no rash ?Neuro: alert and oriented x 3 ?Psych: normal mood and affect  ? ?Data Reviewed: ?Imaging: ?CT high-resolution 06/25/2015.  Report from [facility] ?Mild to moderate cylindrical bronchiectasis in lingula and bilateral lower lobes.  Patchy  consolidation and groundglass in lingula and bilateral lower lobes. ?Fine groundglass centrilobular nodularity in the upper lungs ? ?CT chest 02/26/2016 ?Mild subpleural reticular densities and bronchiectasis at the base.  Aortic and coronary atherosclerosis ? ?High-resolution CT 08/25/2021 ?Reduce of groundglass attenuation and septal thickening and bronchovascular interstitial lung with cylindrical bronchiectasis.  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 ?I have reviewed the images personally. ? ?PFTs: ?Spirometry 10/26/2020 ?FVC 1.75 [75%], FEV1 1.57 [91%], F/F 19 ?No obstruction, restriction possible ? ?10/08/2021 ?FVC 1.68 [72%], FEV1 1.18 [68%], F/F 70, TLC 2.92 [65%], DLCO 9.47 [57%] ?Moderate restriction and diffusion defect ? ?Labs: ?CTD serologies 09/28/2021-ANA 1: 40 nucleolar homogeneous, SSA 28 ? ?Assessment:  ?Acute bronchitis ?She is suffering from acute bronchitis with with sinus symptoms for the past 1 week.  We will call in Z-Pak and prednisone 40 mg a day for 5 days ?Use Mucinex over-the-counter ? ?Evaluation for bronchiectasis ?She has history of bronchiectasis was presumed secondary to chronic immunodeficiency ?Further review of CT scan shows some fibrotic changes.  She did have mild reticulation at the base with progression since 2017.  This raises the question of ILD or UIP pulmonary fibrosis ?Her complete CTD serologies are pending.  I will review scan at multidisciplinary conference and plan for next steps ? ?Severe persistent asthma ?On Breo, ProAir. ?Arnuity add-on started by Dr. Ernst Bowler ? ?GERD ?Continue omeprazole ? ?Chronic rhinitis ?Continue Flonase ? ?Plan/Recommendations: ?Z-Pak, prednisone ?Await connective tissue disease serologies ?Multidisciplinary ILD conference. ?Mucociliary clearance with Acapella ?Continue inhalers for asthma ? ?Marshell Garfinkel MD ?[facility] ?10/08/2021, 4:22 PM ? ?CC: [name], PA-C ? ?  ?

## 2021-10-08 NOTE — Progress Notes (Signed)
PFT done today. 

## 2021-10-08 NOTE — Patient Instructions (Signed)
We will call in a Z-Pak ?Call in prednisone 40 mg a day for 5 days for acute treatment of cough ?You can use Mucinex over-the-counter ? ?I will review your scan at a conference to see if he can come up with a consensus for diagnosis ?Return to clinic in 2 months for follow-up ? ? ?

## 2021-10-15 LAB — MYOMARKER 3 PLUS PROFILE (RDL)
Anti-EJ Ab (RDL): NEGATIVE
Anti-Jo-1 Ab (RDL): <20 Units (ref <20)
Anti-Ku Ab (RDL): NEGATIVE
Anti-MDA-5 Ab (CADM-140)(RDL): <20 Units (ref <20)
Anti-Mi-2 Ab (RDL): NEGATIVE
Anti-NXP-2 (P140) Ab (RDL): <20 Units (ref <20)
Anti-OJ Ab (RDL): NEGATIVE
Anti-PL-12 Ab (RDL: NEGATIVE
Anti-PL-7 Ab (RDL): NEGATIVE
Anti-PM/Scl-100 Ab (RDL): <20 Units (ref <20)
Anti-SAE1 Ab, IgG (RDL): <20 Units (ref <20)
Anti-SRP Ab (RDL): NEGATIVE
Anti-SS-A 52kD Ab, IgG (RDL): 28 Units — ABNORMAL HIGH (ref <20)
Anti-TIF-1gamma Ab (RDL): <20 Units (ref <20)
Anti-U1 RNP Ab (RDL): <20 Units (ref <20)
Anti-U2 RNP Ab (RDL): NEGATIVE
Anti-U3 RNP (Fibrillarin)(RDL): NEGATIVE

## 2021-10-19 ENCOUNTER — Encounter: Payer: Self-pay | Admitting: Allergy & Immunology

## 2021-10-25 ENCOUNTER — Encounter: Payer: Self-pay | Admitting: Allergy & Immunology

## 2021-10-25 ENCOUNTER — Ambulatory Visit (INDEPENDENT_AMBULATORY_CARE_PROVIDER_SITE_OTHER): Payer: Medicare Other | Admitting: Allergy & Immunology

## 2021-10-25 VITALS — BP 148/80 | HR 84 | Temp 97.3°F | Resp 16 | Ht 60.0 in | Wt 126.4 lb

## 2021-10-25 DIAGNOSIS — E538 Deficiency of other specified B group vitamins: Secondary | ICD-10-CM

## 2021-10-25 DIAGNOSIS — D839 Common variable immunodeficiency, unspecified: Secondary | ICD-10-CM

## 2021-10-25 DIAGNOSIS — J310 Chronic rhinitis: Secondary | ICD-10-CM

## 2021-10-25 DIAGNOSIS — J4550 Severe persistent asthma, uncomplicated: Secondary | ICD-10-CM

## 2021-10-25 NOTE — Progress Notes (Signed)
? ?FOLLOW UP ? ?Date of Service/Encounter:  10/25/21 ? ? ?Assessment:  ? ?Severe persistent asthma without complication ?  ?Common variable immunodeficiency - on HyQvia every three weeks (recently increased dose of 40 grams every 3 weeks December 2022) ?  ?Anemia of chronic disease ?  ?B12 deficiency - improved on B12 supplementation ?  ?Bronchiectasis without complication - previously followed by Dr. Camillo Flaming, now followed by Dr. Vaughan Browner with transition to more of a UIP OR ILD picture now  ?  ?Chronic rhinitis ?  ?Gastroesophageal reflux disease - on a PPI ?  ?Chronic back pain - s/p kyphoplasty October 2022 ?  ?Fatigue - likely related to recent PNA and back surgery (consider larger workup at the next visit) ? ?Paternal history unknown (father might have died from emphysema, but largely unknown) ? ? ?Janet Garcia presents for a follow up visit. She has had an eventful few months since I saw her last. She has established care with Dr. Vaughan Browner of [facility]. We are awaiting a conference with the Pulmonology group regarding a management strategy and she seems content with this wait and see approach. She is more hoarse today, but otherwise looks about the same as she usually does. I did review her findings from her [employer] panel that we did in December 2021, where she had eight variants of unknown significance. Unfortunately none of these are associated with pulmonary fibrosis as far as I can tell from my review of the literature.  ? ? ?Defects in ARPC1B because a combined immune deficiency and actinopathy associated with a mild bleeding tendency.  This typically presents in childhood.  ? ?Defects in ATM result and ataxia telangiectasia.  Again, this presents in childhood and would have been noted a lot earlier. ? ?Defect in [facility] lead to CVID, however they are typically notably absence of B cells which is not the case with Janet Garcia.  They also have a skewing of their T cells to a memory CD45RO+ phenotype. ? ?Defects in  IL10RA lead to early onset inflammatory bowel disease, as in the neonatal period.  She has no evidence of inflammatory bowel disease and again this would have been caught earlier. ? ?Defects in KMT2A are associated with Widemaa-Steiner Syndrome, which is a disorder characterized by hypertrichosis, short stature, intellectual disability, and developmental delay.  This does not describe Janet Garcia at all.  ? ?Defects in PSMB8 are associated with auto inflammatory syndrome.  This syndrome is characterized by recurrent inflammatory attacks including fever and skin eruptions as well as lipo muscular atrophy. ? ?Defects in SPPL2A are associated with a susceptibility to mycobacterial and Salmonella infections.  She has never been infected with these disorders. ? ?Plan/Recommendations:  ? ? ?1. Common variable immunodeficiency ?- We will continue with the current dosing of Hyqvia (40 grams each time).  ?- We will get some routine labs today. ?- Continue with the premedication.  ? ?2. Severe persistent asthma, uncomplicated ?- Lung function not done today since you were recovering from your recent illness.  ?- Daily controller medication(s): Breo 200/25 mcg one puff once daily ?- Prior to physical activity: ProAir 2 puffs 10-15 minutes before physical activity. ?- Rescue medications: ProAir 4 puffs every 4-6 hours as needed or albuterol nebulizer one vial every 4-6 hours as needed ?- Changes during respiratory infections or worsening symptoms: Add on Arnuity 285mg to  one inhalation  twice daily for TWO WEEKS. ?- You can also add on albuterol/Pulmicort neb BEFORE the Acapella device to open things up for  a few weeks during flares.  ?- Asthma control goals:  ?* Full participation in all desired activities (may need albuterol before activity) ?* Albuterol use two time or less a week on average (not counting use with activity) ?* Cough interfering with sleep two time or less a month ?* Oral steroids no more than once a year ?* No  hospitalizations ? ?3. Chronic rhinitis ?- Continue with Flonase one spray per nostril daily. ?- Consider using nasal saline rinses prior to using the Flonase.  ?- I think Allegra every other day should be fine.  ? ?4. Gastroesophageal reflux disease ?- Continue with omeprazole at the current dosing. ? ?5. Return in about 3 months (around 01/24/2022).  ? ? ? ?Subjective:  ? ?Janet Garcia is a 76 y.o. female presenting today for follow up of  ?Chief Complaint  ?Patient presents with  ? Cough  ?  Really bad cough. Pulmonologist gave her prednisone (12th day today) Regular doctor gave her 5 days.  ? Asthma  ?  Been ok just a few times where she feels like there is a brick sitting on her chest.  ? Allergic Rhinitis   ? ? ?Janet Garcia has a history of the following: ?Patient Active Problem List  ? Diagnosis Date Noted  ? 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 B12 deficiency 05/10/2017  ? Recurrent infections 02/24/2016  ? 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[place]) 02/24/2016  ? Severe persistent asthma 02/24/2016  ? Chronic rhinitis 02/24/2016  ? Anemia of chronic disease 02/24/2016  ? Opacity of lung on imaging study 02/24/2016  ? Moderate persistent asthma 09/15/2015  ? Esophageal reflux 09/15/2015  ? Essential hypertension 09/15/2015  ? Abnormal liver enzymes 07/26/2015  ? 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 H/O total hip arthroplasty 07/26/2015  ? Lumbosacral spondylosis 07/26/2015  ? Asthma, mild persistent 07/05/2015  ? Abnormal CAT scan 06/30/2015  ? PNA (pneumonia) 06/21/2015  ? Anxiety 05/23/2015  ? HLD (hyperlipidemia) 03/24/2015  ? 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 Family history of colonic polyps 01/13/2015  ? Recurrent disease 11/21/2014  ? Essential (primary) hypertension 08/15/2014  ? Anxiety, generalized 08/15/2014  ? Left lower lobe pneumonia 04/16/2014  ? Arthritis, degenerative 04/16/2014  ? 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 Bronchiectasis ([place]) 03/10/2014  ? H/O  respiratory system disease 03/10/2014  ? Can't get food down 03/01/2014  ? 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 Lumbar radiculopathy 02/28/2014  ? Headache, migraine 02/28/2014  ? Osteopenia 02/28/2014  ? Airway hyperreactivity 02/24/2014  ? Closed nondisplaced fracture of proximal phalanx of lesser toe of right foot 01/24/2014  ? Allergic rhinitis 11/05/2013  ? Degeneration of intervertebral disc of lumbosacral region 11/05/2013  ? Esophagitis, reflux 11/05/2013  ? ? ?History obtained from: chart review and patient. ? ?Ife is a 76 y.o. female presenting for a follow up visit. She was last seen in January 2023. At that time, she had recently experienced 4 to 5 months of worsening respiratory issues.  She had required around 4 courses of antibiotics as well as prednisone for breathing.  Her chest x-ray did finally normalized, but she was continuing to have mucus production which she felt was from her lungs.  She requested another pulmonology evaluation. ? ?Since the last visit, she has mostly done well. She has been coughing incessantly this spring. She is still waiting for a diagnosis from Pulmonology, so treatment is still pending. She has 3 more days of prednisone. She is having insomnia from that.  ? ?She  remains on the Breo one puff once daily. She has backed off of her nebulizer since she did not think that she needed. She did find her "pickle" and her incentive spirometer. She has been doing well with that.  ? ?She did see Dr. Vaughan Browner with pulmonology.  She was diagnosed with acute bronchitis in early March and given azithromycin and prednisone.  She had a repeat chest CT that showed fibrotic changes.  She had reticulation at the bases with progression since 2017.  She was continued on Breo and albuterol. She is going to be presented at the Pulmonology Board with his colleagues. Her next appointment is in May. She was treated with azithromycin at the last visit in March 2023.  ? ?From what Janet Garcia  took from the visit, she was told that this might be a derivative of cystic fibrosis. But Dr. Vaughan Browner thinks that this is secondary to her immnuodeficiency. She does have a productive cough and once she expe

## 2021-10-25 NOTE — Patient Instructions (Addendum)
1. Common variable immunodeficiency ?- We will continue with the current dosing of Hyqvia (40 grams each time).  ?- We will get some routine labs today. ?- Continue with the premedication.  ? ?2. Severe persistent asthma, uncomplicated ?- Lung function not done today since you were recovering from your recent illness.  ?- Daily controller medication(s): Breo 200/25 mcg one puff once daily ?- Prior to physical activity: ProAir 2 puffs 10-15 minutes before physical activity. ?- Rescue medications: ProAir 4 puffs every 4-6 hours as needed or albuterol nebulizer one vial every 4-6 hours as needed ?- Changes during respiratory infections or worsening symptoms: Add on Arnuity to  one inhalation  twice daily for TWO WEEKS. ?- You can also add on albuterol/Pulmicort neb BEFORE the Acapella device to open things up for a few weeks during flares.  ?- Asthma control goals:  ?* Full participation in all desired activities (may need albuterol before activity) ?* Albuterol use two time or less a week on average (not counting use with activity) ?* Cough interfering with sleep two time or less a month ?* Oral steroids no more than once a year ?* No hospitalizations ? ?3. Chronic rhinitis ?- Continue with Flonase one spray per nostril daily. ?- Consider using nasal saline rinses prior to using the Flonase.  ?- I think Allegra every other day should be fine.  ? ?4. Gastroesophageal reflux disease ?- Continue with omeprazole at the current dosing. ? ?5. Return in about 3 months (around 01/24/2022).  ? ? ?Please inform us of any Emergency Department visits, hospitalizations, or changes in symptoms. Call us before going to the ED for breathing or allergy symptoms since we might be able to fit you in for a sick visit. Feel free to contact us anytime with any questions, problems, or concerns. ? ?It was a pleasure to see you again today! ? ?Websites that have reliable patient information: ?1. American Academy of Asthma, Allergy, and  Immunology: www.aaaai.org ?2. Food Allergy Research and Education (FARE): foodallergy.org ?3. Mothers of Asthmatics: http://www.asthmacommunitynetwork.org ?4. [employer] of Allergy, Asthma, and Immunology: MissingWeapons.ca ? ? ?COVID-19 Vaccine Information can be found at: PodExchange.nl For questions related to vaccine distribution or appointments, please email [email or web address] or call [phone].  ? ?We realize that you might be concerned about having an allergic reaction to the COVID19 vaccines. To help with that concern, WE ARE OFFERING THE COVID19 VACCINES IN OUR OFFICE! Ask the front desk for dates!  ? ? ? ??Like? [place] on Facebook and Instagram for our latest updates!  ?  ? ? ?A healthy democracy works best when [employer] participate! Make sure you are registered to vote! If you have moved or changed any of your contact information, you will need to get this updated before voting! ? ?In some cases, you MAY be able to register to vote online: AromatherapyCrystals.be ? ? ? ? ?

## 2021-10-30 ENCOUNTER — Encounter: Payer: Self-pay | Admitting: Allergy & Immunology

## 2021-12-01 ENCOUNTER — Other Ambulatory Visit: Payer: Self-pay | Admitting: Allergy & Immunology

## 2021-12-01 DIAGNOSIS — J310 Chronic rhinitis: Secondary | ICD-10-CM

## 2021-12-04 ENCOUNTER — Other Ambulatory Visit: Payer: Self-pay

## 2021-12-04 DIAGNOSIS — J310 Chronic rhinitis: Secondary | ICD-10-CM

## 2021-12-04 MED ORDER — FLUTICASONE FUROATE-VILANTEROL 200-25 MCG/ACT IN AEPB: 1.0000 | INHALATION_SPRAY | Freq: Every day | RESPIRATORY_TRACT | 2 refills | Status: DC

## 2021-12-04 NOTE — Telephone Encounter (Signed)
Refill for Breo Ellipta 200 mcg  (60 each) x 1 with 2 refills sent to [facility]. ?

## 2021-12-11 ENCOUNTER — Ambulatory Visit (INDEPENDENT_AMBULATORY_CARE_PROVIDER_SITE_OTHER): Payer: Medicare Other | Admitting: Pulmonary Disease

## 2021-12-11 ENCOUNTER — Encounter: Payer: Self-pay | Admitting: Pulmonary Disease

## 2021-12-11 VITALS — BP 140/76 | HR 87 | Temp 98.4°F | Ht 60.0 in | Wt 128.2 lb

## 2021-12-11 DIAGNOSIS — J4550 Severe persistent asthma, uncomplicated: Secondary | ICD-10-CM

## 2021-12-11 DIAGNOSIS — J849 Interstitial pulmonary disease, unspecified: Secondary | ICD-10-CM

## 2021-12-11 NOTE — Patient Instructions (Signed)
I'm glad you're feeling better Will refer you to rheumatology for evaluation of Sjogren's syndrome and interstitial lung disease We will order follow-up high resolution CT in 3 months and return to clinic after CT scan

## 2021-12-11 NOTE — Addendum Note (Signed)
Addended by: Jacquiline Doe on: 12/11/2021 12:10 PM   Modules accepted: Orders

## 2021-12-11 NOTE — Progress Notes (Signed)
Janet Garcia    [id number]    18-Jun-1946  Primary Care Physician:Garcia, Janet Duffel, PA-C  Referring Physician: [name], PA-C [place],  [place] [phone]  Chief complaint: Follow-up for bronchiectasis medication, asthma  HPI: 76 year old with history of severe persistent asthma, common variable immunodeficiency, GERD She follows with Dr. Ernst Garcia and is on immunoglobulin subcutaneous injections.  She has history of bronchiectasis previously followed by Dr. Camillo Garcia.  She has been referred for evaluation of bronchiectasis Complains of occasional productive cough, dyspnea with wheezing.  She has indigestion from GERD.  Pets: Cats Occupation: A Neurosurgeon Exposures: No mold, hot tub, Jacuzzi.  No feather pillows or comforters Smoking history: Never smoker Travel history: Previously lived in [place] Relevant family history: No family history of lung disease  Interim history: She is here for review of CT and PFTs States that she has sinus congestion, productive cough with yellow mucus for the past few days.  Breathing is stable with no increase in dyspnea on exertion   Outpatient Encounter Medications as of 12/11/2021  Medication Sig   albuterol (PROAIR HFA) 108 (90 Base) MCG/ACT inhaler Inhale 2 puffs into the lungs every 4 (four) hours as needed.   albuterol (PROVENTIL) (2.5 MG/3ML) 0.083% nebulizer solution    ALPRAZolam (XANAX) 0.25 MG tablet Take 0.25 mg by mouth.   Artificial Tear Solution (SYSTANE CONTACTS OP) Apply to eye.   aspirin 81 MG EC tablet Take 81 mg by mouth daily.   atorvastatin (LIPITOR) 20 MG tablet Take 20 mg by mouth.   budesonide (PULMICORT) 0.5 MG/2ML nebulizer solution Use one vial mixed with an albuterol nebulizer vial every 6 hours during periods of respiratory distress for 1-2 weeks at a time.   Calcium Carbonate-Vitamin D 600-200 MG-UNIT TABS Take 2,000 tablets by mouth.     Coenzyme Q10 (CO Q 10 PO) Take by mouth daily.   diltiazem (CARDIZEM CD) 120 MG 24 hr capsule TAKE 1 CAPSULE(120 MG) BY MOUTH DAILY. FOLLOW UP APPOINTMENT   EPIPEN 2-PAK 0.3 MG/0.3ML SOAJ injection Inject 0.3 mLs (0.3 mg total) into the muscle as needed for anaphylaxis.   Esomeprazole Magnesium (NEXIUM 24HR PO) Take by mouth.   fluticasone furoate-vilanterol (BREO ELLIPTA) 200-25 MCG/ACT AEPB Inhale 1 puff into the lungs daily.   ibuprofen (ADVIL,MOTRIN) 200 MG tablet Take 200 mg by mouth.   Immune Globulin-Hyaluronidase (HYQVIA) 30 GM/300ML KIT Inject 38 g into the skin every 21 ( twenty-one) days.   loratadine (CLARITIN) 10 MG tablet Take by mouth.   losartan (COZAAR) 25 MG tablet Take by mouth.    montelukast (SINGULAIR) 10 MG tablet    Multiple Vitamin (MULTIVITAMIN) tablet Take by mouth.   omeprazole (PRILOSEC) 20 MG capsule    polyethylene glycol powder (GLYCOLAX/MIRALAX) 17 GM/SCOOP powder Take by mouth.   Romosozumab-aqqg (EVENITY [place]) Inject into the skin. 1 x per month   sertraline (ZOLOFT) 50 MG tablet Take by mouth.   vitamin B-12 (CYANOCOBALAMIN) 1000 MCG tablet Take by mouth.   [DISCONTINUED] azithromycin (ZITHROMAX) 250 MG tablet Take as directed   [DISCONTINUED] predniSONE (DELTASONE) 20 MG tablet Take 100m for 5 days   No facility-administered encounter medications on file as of 12/11/2021.    Physical Exam: Blood pressure 130/70, pulse 96, temperature 98.3 F (36.8 C), temperature source Oral, height 5' (1.524 m), weight 123 lb 3.2 oz (55.9 kg), SpO2 97 %. Gen:  No acute distress HEENT:  EOMI, sclera anicteric Neck:     No masses; no thyromegaly Lungs:    Clear to auscultation bilaterally; normal respiratory effort CV:         Regular rate and rhythm; no murmurs Abd:      + bowel sounds; soft, non-tender; no palpable masses, no distension Ext:    No edema; adequate peripheral perfusion Skin:      Warm and dry; no rash Neuro: alert and oriented x 3 Psych: normal  mood and affect   Data Reviewed: Imaging: CT high-resolution 06/25/2015.  Report from [facility] Mild to moderate cylindrical bronchiectasis in lingula and bilateral lower lobes.  Patchy consolidation and groundglass in lingula and bilateral lower lobes. Fine groundglass centrilobular nodularity in the upper lungs  CT chest 02/26/2016 Mild subpleural reticular densities and bronchiectasis at the base.  Aortic and coronary atherosclerosis  High-resolution CT 08/25/2021 Reduce of groundglass attenuation and septal thickening and bronchovascular interstitial lung with cylindrical bronchiectasis.  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 I have reviewed the images personally.  PFTs: Spirometry 10/26/2020 FVC 1.75 [75%], FEV1 1.57 [91%], F/F 19 No obstruction, restriction possible  10/08/2021 FVC 1.68 [72%], FEV1 1.18 [68%], F/F 70, TLC 2.92 [65%], DLCO 9.47 [57%] Moderate restriction and diffusion defect  Labs: CTD serologies 09/28/2021-ANA 1: 40 nucleolar homogeneous, SSA 28  Assessment:  Evaluation for interstitial lung disease She has history of bronchiectasis was presumed secondary to chronic immunodeficiency Further review of CT scan shows some fibrotic changes.  She did have mild reticulation at the base with progression since 2017.  This raises the question of CTD ILD or UIP pulmonary fibrosis  Noted to have elevation in ANA and SSA. She does have dry mouth and dry ice raising the possibility of Sjogren's syndrome. I will refer her to rheumatology for evaluation. Repeat CT scan in three months for evaluation of the lung as proper evaluation of interstitial lung diseases subscriber motion artifact  Severe persistent asthma On Breo, ProAir. Arnuity add-on started by Dr. Ernst Garcia  GERD Continue omeprazole  Chronic rhinitis Continue Flonase  Plan/Recommendations: Referral to rheumatology Repeat high-resolution CT Mucociliary clearance with Acapella Continue inhalers for  asthma  Janet Garfinkel MD [facility] 12/11/2021, 11:45 AM  CC: Janet Lush, PA-C

## 2022-01-04 ENCOUNTER — Ambulatory Visit (INDEPENDENT_AMBULATORY_CARE_PROVIDER_SITE_OTHER): Payer: Medicare Other | Admitting: Pulmonary Disease

## 2022-01-04 DIAGNOSIS — J849 Interstitial pulmonary disease, unspecified: Secondary | ICD-10-CM

## 2022-01-04 NOTE — Progress Notes (Addendum)
   Interstitial Lung Disease Multidisciplinary Conference   Janet Garcia    MRN [id number]    DOB 22-Apr-1946  Primary Care Physician:Payne, Vic Blackbird, PA-C  Referring Physician: Dr. Chilton Greathouse  Time of Conference: 7.30am- 8.30am Date of conference: 01/01/2022 Location of Conference: -  Virtual  Participating Pulmonary: Dr. 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  She has history of bronchiectasis that was presumed to be secondary to immunodeficiency but her CT also shows fibrotic changes and probable UIP pattern.  Please review scan and determine pattern   Serology:  CTD serologies 09/28/2021-ANA 1: 40 nucleolar homogeneous, SSA 28  MDD discussion of CT scan   High-resolution CT  06/25/2015. [facility] High-resolution CT 08/25/2021 CT scan findings show. Mild to moderate cylindrical bronchiectasis in lingula and bilateral lower lobes.  Patchy consolidation and groundglass in lingula and bilateral lower lobes.  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  This could be consistent with IPF in the right clinical settings. Noted that she has a rheumatology evaluation pending for elevated SSA  Recommend repeat CT later this year better images and no motion artifact we may be able to get more clear idea of progression.  She would be a candidate for antifibrotics  Time Spent in preparation and discussion:  > 30 min  SIGNATURE   Chilton Greathouse MD [facility] See Amion for pager  If no response to pager , please call [phone] until 7pm After 7:00 pm call  Elink  [phone] 01/04/2022, 9:42 AM

## 2022-02-07 ENCOUNTER — Ambulatory Visit (INDEPENDENT_AMBULATORY_CARE_PROVIDER_SITE_OTHER): Payer: Medicare Other | Admitting: Allergy & Immunology

## 2022-02-07 VITALS — BP 132/78 | HR 88 | Temp 97.6°F | Ht 60.0 in | Wt 128.0 lb

## 2022-02-07 DIAGNOSIS — D839 Common variable immunodeficiency, unspecified: Secondary | ICD-10-CM | POA: Diagnosis not present

## 2022-02-07 DIAGNOSIS — J310 Chronic rhinitis: Secondary | ICD-10-CM

## 2022-02-07 DIAGNOSIS — J4550 Severe persistent asthma, uncomplicated: Secondary | ICD-10-CM

## 2022-02-07 DIAGNOSIS — E538 Deficiency of other specified B group vitamins: Secondary | ICD-10-CM | POA: Diagnosis not present

## 2022-02-07 DIAGNOSIS — J479 Bronchiectasis, uncomplicated: Secondary | ICD-10-CM

## 2022-02-07 NOTE — Progress Notes (Signed)
FOLLOW UP  Date of Service/Encounter:  02/07/22   Assessment:   Severe persistent asthma without complication   Common variable immunodeficiency - on HyQvia every three weeks (recently increased dose of 40 grams every 3 weeks December 2022)   Anemia of chronic disease   B12 deficiency - improved on B12 supplementation   Bronchiectasis without complication - previously followed by Dr. Su Monks, now followed by Dr. Isaiah Serge with transition to more of a UIP OR ILD picture now    Chronic rhinitis   Gastroesophageal reflux disease - on a PPI   Chronic back pain - s/p kyphoplasty October 2022   Fatigue - largely improved  Paternal history unknown (father might have died from emphysema, but largely unknown)  Plan/Recommendations:   1. Common variable immunodeficiency - We will continue with the current dosing of Hyqvia (38 grams each time).  - We will get some routine labs toady. - Continue with the premedication.   2. Severe persistent asthma, uncomplicated - Lung testing looked amazing.  - Daily controller medication(s): Breo 200/25 mcg one puff once daily - Prior to physical activity: ProAir 2 puffs 10-15 minutes before physical activity. - Rescue medications: ProAir 4 puffs every 4-6 hours as needed or albuterol nebulizer one vial every 4-6 hours as needed - Changes during respiratory infections or worsening symptoms: Add on Arnuity to  one inhalation  twice daily for TWO WEEKS. - You can also add on albuterol/Pulmicort neb BEFORE the Acapella device to open things up for a few weeks during flares.  - Asthma control goals:  * Full participation in all desired activities (may need albuterol before activity) * Albuterol use two time or less a week on average (not counting use with activity) * Cough interfering with sleep two time or less a month * Oral steroids no more than once a year * No hospitalizations  3. Chronic rhinitis - Continue with Flonase one spray per  nostril daily. - Consider using nasal saline rinses prior to using the Flonase.  - I think Allegra every other day should be fine.   4. Gastroesophageal reflux disease - Continue with omeprazole at the current dosing.  5. Return in about 4 months (around 06/10/2022).    Subjective:   Janet Garcia is a 75 y.o. female presenting today for follow up of  Chief Complaint  Patient presents with   Follow-up         Janet Garcia has a history of the following: Patient Active Problem List   Diagnosis Date Noted   Closed wedge compression fracture of T7 vertebra with delayed healing 05/04/2021   Closed wedge compression fracture of T9 vertebra with delayed healing 05/04/2021   Age-related osteoporosis with current pathological fracture with delayed healing 02/02/2021   History of vertebral compression fracture 06/29/2020   Symptomatic PVCs 07/29/2019   Diastolic dysfunction 06/30/2019   Osteoarthritis of left hip 03/05/2018   Aortic atherosclerosis (HCC) 01/19/2018   B12 deficiency 05/10/2017   Insomnia 03/25/2017   Closed compression fracture of L3 vertebra (HCC) 08/06/2016   Recurrent infections 02/24/2016   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/24/2016   Severe persistent asthma 02/24/2016   Chronic rhinitis 02/24/2016   Anemia of chronic disease 02/24/2016   Opacity of lung on imaging study 02/24/2016   Cataract 10/11/2015   Moderate persistent asthma 09/15/2015   Esophageal reflux 09/15/2015   Essential hypertension 09/15/2015   Abnormal liver enzymes 07/26/2015   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   H/O total hip arthroplasty 07/26/2015  Lumbosacral spondylosis 07/26/2015   Asthma, mild persistent 07/05/2015   Abnormal CAT scan 06/30/2015   PNA (pneumonia) 06/21/2015   Anxiety 05/23/2015   HLD (hyperlipidemia) 03/24/2015   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   Family history of colonic polyps 01/13/2015   Recurrent disease  11/21/2014   Essential (primary) hypertension 08/15/2014   Anxiety, generalized 08/15/2014   Left lower lobe pneumonia 04/16/2014   Arthritis, degenerative 04/16/2014   Primary osteoarthritis involving multiple joints 04/16/2014   Chronic infection of sinus 03/15/2014   Bronchiectasis (HCC) 03/10/2014   H/O respiratory system disease 03/10/2014   Can't get food down 03/01/2014   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  Janet Garcia is a 76 y.o. female presenting for a follow up visit.  She was last seen in April 2023.  At that time, we continued with the dosing of HyQvia.  We also continued with Breo 200 mcg 1 puff twice daily and albuterol as needed.  For her rhinitis, she was continued on Flonase as well as Allegra.  GERD was controlled with omeprazole daily.  She had recently established care with Dr. Isaiah Serge with pulmonology.  He felt that she was more of a UIP or interstitial lung disease picture rather than bronchiectasis.  Since the last visit, she has mostly done well.   Asthma/Respiratory Symptom History: She continues to follow with Dr. Isaiah Serge. There is still no diagnosis at all. She is going to see a Rheumatologist.  She remains on the Breo 1 puff once daily.  This seems to be controlling her symptoms fairly well.  She has not been using her rescue inhaler much at all.  She continues to be hoarse, but her breathing is under much better control than it was last time I saw her.  She continues to follow with Dr. Isaiah Serge.  There is still no diagnosis at all. She is going to see a Rheumatologist. She has not seen Rheumatology yet. She did not get a good CT scan since she was coughing so much and there was so much artifact. She has had an elevated SSA antibody level in the past. She has an appointment on December 14th, 2023. Per the note, he is concerned with UIP in addition to bronchiectasis.   She has been rebuilding her core muscle. She is also doing chair yoga at [employer]. She takes an Advil intermittently. She is seeing Dr. Laurian Brim in [place]. They are doing nerve ablation procedures to help numb the pain. She has a lot of pain still when she walks and gets up. She does have a cane with a little seat for her take some breaks occasionally.   She has not had any infections at all since the spring. She has not been sick any longer.  She remains on her Hyqvia 38 grams each time every 3 weeks. Infusions are going well. They are three hours. Typically she has to take a Benadrly and she is going to sleep during that time anyway. She has recently developed at one occasions and developed some redness around the infusion site. This was possibly  felt to be related to an allergy to the Hyaluronidase.   She is not going to take the COVID vaccines any longer. She had PNA after that one. She is still going to get flu shots however. She is also up to date on her Tetanus shot. She has had Shingrix. She has not had a Prevnar 20.  Otherwise, there have been no changes to her past medical history, surgical history, family history, or social history.    Review of Systems  Constitutional:  Negative for chills, fever, malaise/fatigue and weight loss.  HENT: Negative.  Negative for congestion, ear discharge, ear pain and sinus pain.   Eyes:  Negative for pain, discharge and redness.  Respiratory:  Negative for cough, sputum production, shortness of breath, wheezing and stridor.        Positive for hoarseness.    Cardiovascular: Negative.  Negative for chest pain and palpitations.  Gastrointestinal:  Negative for abdominal pain, constipation, diarrhea, heartburn, nausea and vomiting.  Skin: Negative.  Negative for itching and rash.  Neurological:  Negative for dizziness and headaches.  Endo/Heme/Allergies:  Negative for environmental allergies. Does not bruise/bleed easily.       Objective:   Blood pressure 132/78, pulse 88, temperature 97.6 F (36.4 C), temperature source Temporal, height 5' (1.524 m), weight 128 lb (58.1 kg), SpO2 98 %. Body mass index is 25 kg/m.    Physical Exam Vitals reviewed.  Constitutional:      Appearance: She is well-developed.     Comments: Delightful as always. Cooperative with the exam. Hoarseness present.  HENT:     Head: Normocephalic and atraumatic.     Right Ear: Tympanic membrane, ear canal and external ear normal.     Left Ear: Tympanic membrane, ear canal and external ear normal.     Nose: No nasal deformity, septal deviation, mucosal edema or rhinorrhea.     Right Turbinates: Enlarged and swollen.     Left Turbinates: Enlarged and swollen.     Right Sinus: No maxillary sinus tenderness or frontal sinus tenderness.     Left Sinus: No maxillary sinus tenderness or frontal sinus tenderness.     Comments: No nasal polyps noted. Turbinates are erythematous.    Mouth/Throat:     Mouth: Mucous membranes are not pale and not dry.     Pharynx: Oropharynx is clear. Uvula midline.     Tonsils: No tonsillar exudate. 1+ on the right. 1+ on the left.  Eyes:     General: Allergic shiner present.        Right eye: No discharge.        Left eye: No discharge.     Conjunctiva/sclera: Conjunctivae normal.     Right eye: Right conjunctiva is not injected. No chemosis.    Left eye: Left conjunctiva is not injected. No chemosis.    Pupils: Pupils are equal, round, and reactive to light.  Cardiovascular:     Rate and Rhythm: Normal rate and regular rhythm.      Heart sounds: Normal heart sounds.  Pulmonary:     Effort: Pulmonary effort is normal. No tachypnea, accessory muscle usage or respiratory distress.     Breath sounds: Examination of the right-lower field reveals decreased breath sounds. Examination of the left-lower field reveals decreased breath sounds. Decreased breath sounds present. No wheezing, rhonchi or rales.     Comments: Moving air well in all lung fields. She does have her baseline hoarseness. There are no crackles today.  Chest:  Chest wall: No tenderness.  Lymphadenopathy:     Cervical: No cervical adenopathy.  Skin:    Coloration: Skin is not pale.     Findings: No abrasion, erythema, petechiae or rash. Rash is not papular, urticarial or vesicular.  Neurological:     Mental Status: She is alert.  Psychiatric:        Behavior: Behavior is cooperative.      Diagnostic studies:    Spirometry: results normal (FEV1: 1.36/76%, FVC: 1.72/74%, FEV1/FVC: 79%).    Spirometry consistent with normal pattern.   Allergy Studies: none        [name], MD  [facility]

## 2022-02-07 NOTE — Patient Instructions (Addendum)
1. Common variable immunodeficiency - We will continue with the current dosing of Hyqvia (38 grams each time).  - We will get some routine labs toady. - Continue with the premedication.   2. Severe persistent asthma, uncomplicated - Lung testing looked amazing.  - Daily controller medication(s): Breo 200/25 mcg one puff once daily - Prior to physical activity: ProAir 2 puffs 10-15 minutes before physical activity. - Rescue medications: ProAir 4 puffs every 4-6 hours as needed or albuterol nebulizer one vial every 4-6 hours as needed - Changes during respiratory infections or worsening symptoms: Add on Arnuity to  one inhalation  twice daily for TWO WEEKS. - You can also add on albuterol/Pulmicort neb BEFORE the Acapella device to open things up for a few weeks during flares.  - Asthma control goals:  * Full participation in all desired activities (may need albuterol before activity) * Albuterol use two time or less a week on average (not counting use with activity) * Cough interfering with sleep two time or less a month * Oral steroids no more than once a year * No hospitalizations  3. Chronic rhinitis - Continue with Flonase one spray per nostril daily. - Consider using nasal saline rinses prior to using the Flonase.  - I think Allegra every other day should be fine.   4. Gastroesophageal reflux disease - Continue with omeprazole at the current dosing.  5. Return in about 4 months (around 06/10/2022).    Please inform us of any Emergency Department visits, hospitalizations, or changes in symptoms. Call us before going to the ED for breathing or allergy symptoms since we might be able to fit you in for a sick visit. Feel free to contact us anytime with any questions, problems, or concerns.  It was a pleasure to see you again today!  Websites that have reliable patient information: 1. American Academy of Asthma, Allergy, and Immunology: www.aaaai.org 2. Food Allergy Research  and Education (FARE): foodallergy.org 3. Mothers of Asthmatics: http://www.asthmacommunitynetwork.org 4. American College of Allergy, Asthma, and Immunology: www.acaai.org   COVID-19 Vaccine Information can be found at: [email or web address] For questions related to vaccine distribution or appointments, please email [email or web address] or call [phone].   We realize that you might be concerned about having an allergic reaction to the COVID19 vaccines. To help with that concern, WE ARE OFFERING THE COVID19 VACCINES IN OUR OFFICE! Ask the front desk for dates!     "Like" [place] on Facebook and Instagram for our latest updates!      A healthy democracy works best when [employer] participate! Make sure you are registered to vote! If you have moved or changed any of your contact information, you will need to get this updated before voting!  In some cases, you MAY be able to register to vote online: [email or web address]

## 2022-02-08 LAB — CMP14+EGFR
ALT: 19 IU/L (ref 0–32)
AST: 25 IU/L (ref 0–40)
Albumin/Globulin Ratio: 1.2 (ref 1.2–2.2)
Albumin: 4.2 g/dL (ref 3.8–4.8)
Alkaline Phosphatase: 121 IU/L (ref 44–121)
BUN/Creatinine Ratio: 18 (ref 12–28)
BUN: 15 mg/dL (ref 8–27)
Bilirubin Total: 0.3 mg/dL (ref 0.0–1.2)
CO2: 25 mmol/L (ref 20–29)
Calcium: 9.5 mg/dL (ref 8.7–10.3)
Chloride: 104 mmol/L (ref 96–106)
Creatinine, Ser: 0.82 mg/dL (ref 0.57–1.00)
Globulin, Total: 3.4 g/dL (ref 1.5–4.5)
Glucose: 81 mg/dL (ref 70–99)
Potassium: 4.2 mmol/L (ref 3.5–5.2)
Sodium: 142 mmol/L (ref 134–144)
Total Protein: 7.6 g/dL (ref 6.0–8.5)
eGFR: 75 mL/min/{1.73_m2} (ref >59)

## 2022-02-08 LAB — CBC WITH DIFFERENTIAL
Basophils Absolute: 0 10*3/uL (ref 0.0–0.2)
Basos: 1 %
EOS (ABSOLUTE): 0 10*3/uL (ref 0.0–0.4)
Eos: 1 %
Hematocrit: 38.5 % (ref 34.0–46.6)
Hemoglobin: 12.9 g/dL (ref 11.1–15.9)
Immature Grans (Abs): 0 10*3/uL (ref 0.0–0.1)
Immature Granulocytes: 0 %
Lymphocytes Absolute: 1 10*3/uL (ref 0.7–3.1)
Lymphs: 24 %
MCH: 32.9 pg (ref 26.6–33.0)
MCHC: 33.5 g/dL (ref 31.5–35.7)
MCV: 98 fL — ABNORMAL HIGH (ref 79–97)
Monocytes Absolute: 0.4 10*3/uL (ref 0.1–0.9)
Monocytes: 10 %
Neutrophils Absolute: 2.8 10*3/uL (ref 1.4–7.0)
Neutrophils: 64 %
RBC: 3.92 x10E6/uL (ref 3.77–5.28)
RDW: 12.1 % (ref 11.7–15.4)
WBC: 4.2 10*3/uL (ref 3.4–10.8)

## 2022-02-08 LAB — IGG: IgG (Immunoglobin G), Serum: 1434 mg/dL (ref 586–1602)

## 2022-02-12 ENCOUNTER — Encounter: Payer: Self-pay | Admitting: Allergy & Immunology

## 2022-02-19 ENCOUNTER — Ambulatory Visit
Admission: RE | Admit: 2022-02-19 | Discharge: 2022-02-19 | Disposition: A | Discharge status: Home / Self Care | Payer: Medicare Other | Source: Ambulatory Visit | Attending: Pulmonary Disease | Admitting: Pulmonary Disease

## 2022-02-19 DIAGNOSIS — J849 Interstitial pulmonary disease, unspecified: Secondary | ICD-10-CM

## 2022-02-27 ENCOUNTER — Other Ambulatory Visit: Payer: Self-pay

## 2022-02-27 DIAGNOSIS — J310 Chronic rhinitis: Secondary | ICD-10-CM

## 2022-02-27 MED ORDER — FLUTICASONE FUROATE-VILANTEROL 200-25 MCG/ACT IN AEPB: 1.0000 | INHALATION_SPRAY | Freq: Every day | RESPIRATORY_TRACT | 3 refills | Status: DC

## 2022-02-27 MED ORDER — FLUTICASONE FUROATE-VILANTEROL 200-25 MCG/ACT IN AEPB: 1.0000 | INHALATION_SPRAY | Freq: Every day | RESPIRATORY_TRACT | 5 refills | Status: DC

## 2022-02-27 NOTE — Telephone Encounter (Signed)
Refill for Breo x 1 with 3 refills at [facility]

## 2022-03-13 ENCOUNTER — Encounter: Payer: Self-pay | Admitting: Pulmonary Disease

## 2022-03-13 ENCOUNTER — Ambulatory Visit (INDEPENDENT_AMBULATORY_CARE_PROVIDER_SITE_OTHER): Payer: Medicare Other | Admitting: Pulmonary Disease

## 2022-03-13 VITALS — BP 134/76 | HR 89 | Temp 97.8°F | Ht 60.0 in | Wt 129.6 lb

## 2022-03-13 DIAGNOSIS — J849 Interstitial pulmonary disease, unspecified: Secondary | ICD-10-CM | POA: Diagnosis not present

## 2022-03-13 DIAGNOSIS — Z5181 Encounter for therapeutic drug level monitoring: Secondary | ICD-10-CM | POA: Diagnosis not present

## 2022-03-13 DIAGNOSIS — J479 Bronchiectasis, uncomplicated: Secondary | ICD-10-CM | POA: Diagnosis not present

## 2022-03-13 NOTE — Patient Instructions (Signed)
Based on our discussion today we will start you on a medication called Ofev to help slow down the scarring process in the lung Follow-up in 3 months

## 2022-03-13 NOTE — Progress Notes (Signed)
Janet Garcia    [id number]    May 10, 1946  Primary Care Physician:Jobe, Alexis Goodell., MD  Referring Physician: [name], PA-C [place] [place],  [place] [address]  Chief complaint: Follow-up for Interstitial lung disease, asthma  HPI: 76 year old with history of severe persistent asthma, common variable immunodeficiency, GERD She follows with Dr. Ernst Bowler and is on immunoglobulin subcutaneous injections.  She has history of bronchiectasis previously followed by Dr. Camillo Flaming.  She has been referred for evaluation of bronchiectasis Complains of occasional productive cough, dyspnea with wheezing.  She has indigestion from GERD.  Pets: Cats Occupation: A Neurosurgeon Exposures: No mold, hot tub, Jacuzzi.  No feather pillows or comforters Smoking history: Never smoker Travel history: Previously lived in [place] Relevant family history: No family history of lung disease  Interim history: She is here for review of CT States that breathing is stable.  Outpatient Encounter Medications as of 03/13/2022  Medication Sig   albuterol (PROAIR HFA) 108 (90 Base) MCG/ACT inhaler Inhale 2 puffs into the lungs every 4 (four) hours as needed.   albuterol (PROVENTIL) (2.5 MG/3ML) 0.083% nebulizer solution    ALPRAZolam (XANAX) 0.25 MG tablet Take 0.25 mg by mouth.   Artificial Tear Solution (SYSTANE CONTACTS OP) Apply to eye.   aspirin 81 MG EC tablet Take 81 mg by mouth daily.   atorvastatin (LIPITOR) 20 MG tablet Take 20 mg by mouth.   budesonide (PULMICORT) 0.5 MG/2ML nebulizer solution Use one vial mixed with an albuterol nebulizer vial every 6 hours during periods of respiratory distress for 1-2 weeks at a time.   Calcium Carbonate-Vitamin D 600-200 MG-UNIT TABS Take 2,000 tablets by mouth.    Coenzyme Q10 (CO Q 10 PO) Take by mouth daily.   diltiazem (CARDIZEM CD) 120 MG 24 hr capsule TAKE 1 CAPSULE(120 MG) BY MOUTH DAILY. FOLLOW  UP APPOINTMENT   EPIPEN 2-PAK 0.3 MG/0.3ML SOAJ injection Inject 0.3 mLs (0.3 mg total) into the muscle as needed for anaphylaxis.   Esomeprazole Magnesium (NEXIUM 24HR PO) Take by mouth.   famotidine (PEPCID) 20 MG tablet Take 20 mg by mouth 2 (two) times daily.   fluticasone furoate-vilanterol (BREO ELLIPTA) 200-25 MCG/ACT AEPB Inhale 1 puff into the lungs daily.   ibuprofen (ADVIL,MOTRIN) 200 MG tablet Take 200 mg by mouth.   Immune Globulin-Hyaluronidase (HYQVIA) 30 GM/300ML KIT Inject 38 g into the skin every 21 ( twenty-one) days.   loratadine (CLARITIN) 10 MG tablet Take by mouth.   losartan (COZAAR) 25 MG tablet Take by mouth.    montelukast (SINGULAIR) 10 MG tablet    Multiple Vitamin (MULTIVITAMIN) tablet Take by mouth.   omeprazole (PRILOSEC) 20 MG capsule    polyethylene glycol powder (GLYCOLAX/MIRALAX) 17 GM/SCOOP powder Take by mouth.   Romosozumab-aqqg (EVENITY [place]) Inject into the skin. 1 x per month   sertraline (ZOLOFT) 50 MG tablet Take by mouth.   vitamin B-12 (CYANOCOBALAMIN) 1000 MCG tablet Take by mouth.   No facility-administered encounter medications on file as of 03/13/2022.    Physical Exam: Blood pressure 134/76, pulse 89, temperature 97.8 F (36.6 C), temperature source Oral, height 5' (1.524 m), weight 129 lb 9.6 oz (58.8 kg), SpO2 96 %. Gen:      No acute distress HEENT:  EOMI, sclera anicteric Neck:     No masses; no thyromegaly Lungs:    Clear to auscultation bilaterally; normal respiratory effort CV:  Regular rate and rhythm; no murmurs Abd:      + bowel sounds; soft, non-tender; no palpable masses, no distension Ext:    No edema; adequate peripheral perfusion Skin:      Warm and dry; no rash Neuro: alert and oriented x 3 Psych: normal mood and affect   Data Reviewed: Imaging: CT high-resolution 06/25/2015.  Report from [facility] Mild to moderate cylindrical bronchiectasis in lingula and bilateral lower lobes.  Patchy consolidation and  groundglass in lingula and bilateral lower lobes. Fine groundglass centrilobular nodularity in the upper lungs  CT chest 02/26/2016 Mild subpleural reticular densities and bronchiectasis at the base.  Aortic and coronary atherosclerosis  High-resolution CT 08/25/2021 Reduce of groundglass attenuation and septal thickening and bronchovascular interstitial lung with cylindrical bronchiectasis.  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  High-resolution CT 02/19/2022.  Redemonstration of groundglass attenuation, septal thickening and bronchiectasis I have reviewed the images personally.  PFTs: Spirometry 10/26/2020 FVC 1.75 [75%], FEV1 1.57 [91%], F/F 19 No obstruction, restriction possible  10/08/2021 FVC 1.68 [72%], FEV1 1.18 [68%], F/F 70, TLC 2.92 [65%], DLCO 9.47 [57%] Moderate restriction and diffusion defect  Labs: CTD serologies 09/28/2021-ANA 1: 40 nucleolar homogeneous, SSA 28  Assessment:  Evaluation for interstitial lung disease She has history of bronchiectasis was presumed secondary to chronic immunodeficiency Further review of CT scan shows some fibrotic changes.  She did have mild reticulation at the base with progression since 2017.  This raises the question of CTD ILD or UIP pulmonary fibrosis  Noted to have elevation in ANA and SSA. She does have dry mouth and dry ice raising the possibility of Sjogren's syndrome.  Rheumatology evaluation is pending  We reviewed her case at multidisciplinary conference on June 2023.  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  Severe persistent asthma On Breo, ProAir. Arnuity add-on started by Dr. Ernst Bowler  GERD Continue omeprazole  Chronic rhinitis Continue Flonase  Plan/Recommendations: Start Ofev Rheumatology eval pending Mucociliary clearance with Acapella Continue inhalers for asthma  Marshell Garfinkel MD [facility] 03/13/2022, 11:42  AM  CC: [name], PA-C

## 2022-03-15 ENCOUNTER — Other Ambulatory Visit (HOSPITAL_COMMUNITY): Payer: Self-pay

## 2022-03-15 ENCOUNTER — Telehealth: Payer: Self-pay

## 2022-03-15 NOTE — Telephone Encounter (Signed)
Received notification from [facility] regarding a prior authorization for OFEV. Authorization has been APPROVED from 03/15/2022 to 07/21/2022. Approval letter has been sent to scan center.  Per test claim, copay for 30 days supply is $2345.63  Patient does not appear to be locked in to any specific pharmacy, however pt will most likely need to fill through [facility] due to Ofev being a limited Distribution medication.  Authorization # [id number]   Will await return of pt portion as well as income documents before submitting to [facility].

## 2022-03-15 NOTE — Telephone Encounter (Signed)
I spoke with patient regarding proof of income document requirement.  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  I advised her to compile income documents that are inclusive of what they have consistently. She states she will reach out to her accountant next week.  She is aware to complete and drop off to the clinic  Chesley Mires, PharmD, MPH, BCPS, CPP Clinical Pharmacist (Rheumatology and Pulmonology)

## 2022-03-15 NOTE — Telephone Encounter (Signed)
Received New start paperwork for OFEV. Per note attached to provider form, pt has taken her portion home with her. Will update as we work through the benefits process.  Submitted a Prior Authorization request to [facility] for OFEV via CoverMyMeds. Will update once we receive a response.   Key: [id number]

## 2022-04-10 NOTE — Telephone Encounter (Signed)
Spoke with pt and she was unable to obtain income verification without rental document numbers. She would like to hold off on starting OFEV therapy until she seems her rheumatologist in December, followed by a visit with the pulmonologist.

## 2022-06-18 ENCOUNTER — Other Ambulatory Visit: Payer: Self-pay

## 2022-06-18 ENCOUNTER — Ambulatory Visit (INDEPENDENT_AMBULATORY_CARE_PROVIDER_SITE_OTHER): Payer: Medicare Other | Admitting: Allergy & Immunology

## 2022-06-18 ENCOUNTER — Encounter: Payer: Self-pay | Admitting: Allergy & Immunology

## 2022-06-18 VITALS — BP 128/72 | HR 104 | Temp 98.1°F | Resp 14 | Ht 60.0 in | Wt 130.3 lb

## 2022-06-18 DIAGNOSIS — J4550 Severe persistent asthma, uncomplicated: Secondary | ICD-10-CM | POA: Diagnosis not present

## 2022-06-18 DIAGNOSIS — E538 Deficiency of other specified B group vitamins: Secondary | ICD-10-CM | POA: Diagnosis not present

## 2022-06-18 DIAGNOSIS — R5383 Other fatigue: Secondary | ICD-10-CM

## 2022-06-18 DIAGNOSIS — D839 Common variable immunodeficiency, unspecified: Secondary | ICD-10-CM

## 2022-06-18 NOTE — Progress Notes (Signed)
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 Su Monks, now followed by Dr. Isaiah Serge with transition to more of a UIP picture (declines chest physiotherapy)    Chronic rhinitis   Gastroesophageal reflux disease - on a PPI   Chronic back pain - s/p kyphoplasty October 2022   Fatigue - previously improved, but worsening as of late   Paternal history unknown (father might have died from emphysema, but largely unknown)  Plan/Recommendations:    1. Common variable immunodeficiency - We will continue with the current dosing of Hyqvia (38 grams each time).  - We will get some routine labs toady. - Continue with the premedication.  - We are also going to get some labs to evaluate your fatigue, including vitamin B levels again toi see if those are dropping.   2. Severe persistent asthma, uncomplicated - Lung testing looked amazing.  - I will send my note to Dr. Isaiah Serge and Dr. Corliss Skains to keep them in the loop..  - Daily controller medication(s): Breo 200/25 mcg one puff once daily - Prior to physical activity: ProAir 2 puffs 10-15 minutes before physical activity. - Rescue medications: ProAir 4 puffs every 4-6 hours as needed or albuterol nebulizer one vial every 4-6 hours as needed - Changes during respiratory infections or worsening symptoms: Add on Arnuity to  one inhalation  twice daily for TWO WEEKS. - You can also add on albuterol/Pulmicort neb BEFORE the Acapella device to open things up for a few weeks during flares.  - Asthma control goals:  * Full participation in all desired activities (may need albuterol before activity) *  Albuterol use two time or less a week on average (not counting use with activity) * Cough interfering with sleep two time or less a month * Oral steroids no more than once a year * No hospitalizations  3. Chronic rhinitis - Continue with Flonase one spray per nostril daily. - Consider using nasal saline rinses prior to using the Flonase.  - I think Allegra every other day should be fine.   4. Gastroesophageal reflux disease - Continue with omeprazole at the current dosing.  5. Return in about 3 months (around 09/18/2022).     Subjective:   Janet Garcia is a 76 y.o. female presenting today for follow up of  Chief Complaint  Patient presents with   Nasal Congestion    Janet Garcia has a history of the following: Patient Active Problem List   Diagnosis Date Noted   Closed wedge compression fracture of T7 vertebra with delayed healing 05/04/2021   Closed wedge compression fracture of T9 vertebra with delayed healing 05/04/2021   Age-related osteoporosis with current pathological fracture with delayed healing 02/02/2021   History of vertebral compression fracture 06/29/2020   Symptomatic PVCs 07/29/2019   Diastolic dysfunction 06/30/2019   Osteoarthritis of left hip 03/05/2018   Aortic atherosclerosis (HCC) 01/19/2018   B12 deficiency 05/10/2017   Insomnia 03/25/2017   Closed compression fracture of L3 vertebra (HCC) 08/06/2016   Recurrent infections 02/24/2016   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/24/2016   Severe persistent asthma 02/24/2016   Chronic rhinitis 02/24/2016   Anemia of chronic disease  02/24/2016   Opacity of lung on imaging study 02/24/2016   Cataract 10/11/2015   Moderate persistent asthma 09/15/2015   Esophageal reflux 09/15/2015   Essential hypertension 09/15/2015   Abnormal liver enzymes 07/26/2015   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   H/O total hip arthroplasty 07/26/2015   Lumbosacral spondylosis 07/26/2015   Asthma, mild  persistent 07/05/2015   Abnormal CAT scan 06/30/2015   PNA (pneumonia) 06/21/2015   Anxiety 05/23/2015   HLD (hyperlipidemia) 03/24/2015   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   Family history of colonic polyps 01/13/2015   Recurrent disease 11/21/2014   Essential (primary) hypertension 08/15/2014   Anxiety, generalized 08/15/2014   Left lower lobe pneumonia 04/16/2014   Arthritis, degenerative 04/16/2014   Primary osteoarthritis involving multiple joints 04/16/2014   Chronic infection of sinus 03/15/2014   Bronchiectasis (HCC) 03/10/2014   H/O respiratory system disease 03/10/2014   Can't get food down 03/01/2014   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  Janet Garcia is a 76 y.o. female presenting for a follow up visit. She was last seen in July 2023. At that time, she was doing very well on her dosing of immunoglobulin. For her lung testing, we continued her on VBreo one puff once daily as well as albuterol as needed. Rhinitis was under good control with the Flonase as well as Allegra. We also continued with omeprazole at the previous dosing.   Since the last visit, she has done well. She did see her daughter in law on Thanksgiving and she was positive. N oone else has been positive. Janet Garcia does report that she has nasal congestion. Everyone was here for thanksgiving which clearly made Janet Garcia very happy.   They are going on a [employer] cruise in November 2024. They are planning to spend three days in  [place] before they go on this cruise. This a 15 day trip from [place] to [place]. 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 She has a lot of items on her plate at this point., but knowing Janet Garcia, this is the way that she likes it.   Asthma/Respiratory Symptom History: She remains on the Breo one puff once daily. She does report that the bronchiectasis kicks in. Her oxygen stays good but she has a lot of phlegm production. She just knows the difference.  She last time she had prednisone was March 2023. This was all centered from a PNA in December 2022. We have never had her on a biologic at all. She has largely been doing well from a pulmonary perspective. T  She continues to follow with Dr. Caren Griffins. Her last visit was in August 2023. At that time, they went over the most recent chest CT. This demonstrated continued groundglass opacities wth septal thickening as well as bronchiectasis. These had initially improved as of the last chest CT in April 2023. Her case was reviewed at the Pulmonology multidisciplinary conference in June 2023. 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 It was recommended that they start antifibrotic therapy, but she has not done  this yet. Dr. Shanon AceMannum's plan at the last visit was to start Ofev and continued mucociliary clearance with the Acapelle device. I asked her today about doing chest physiotherapy and she is not interested in pursuing that.   She has not started Ofev. She reports that her portion of the payment is going to be $2300 per month which is too much for her. She has opted to not start the Ofev. Evidently she makes too much for any kind of support from the drug manufacturer.   Allergic Rhinitis Symptom History: She remains on the fluticasone as well as the Allegra. She has not needed antibiotics in quite some time. She remains on her Hyqvia dosing and does well with that.  She has not had any antibiotics  since December 2022 when she required a couple rounds of antibitoics and a prolonged prednisone taper to get her through that pulmonary infection. From an infectious standpoint, she is actually doing rather well.   GERD Symptom History: She remains on the omperazole for control of her GERD. This seems to be working fairly well.   She sees a Publishing rights managerheumatologist in December about the workup for interstitial lung disease. She has an appointment scheduled with Dr. Corliss Skainseveshwar.   Otherwise, there have been no changes to her past medical history, surgical history, family history, or social history.    Review of Systems  Constitutional: Negative.  Negative for chills, fever, malaise/fatigue and weight loss.  HENT: Negative.  Negative for congestion, ear discharge, ear pain and sinus pain.   Eyes:  Negative for pain, discharge and redness.  Respiratory:  Negative for cough, sputum production, shortness of breath and wheezing.   Cardiovascular: Negative.  Negative for chest pain and palpitations.  Gastrointestinal:  Negative for abdominal pain, constipation, diarrhea, heartburn, nausea and vomiting.  Skin: Negative.  Negative for itching and rash.  Neurological:  Negative for dizziness and headaches.  Endo/Heme/Allergies:  Negative for environmental allergies. Does not bruise/bleed easily.       Objective:   Blood pressure 128/72, pulse (!) 104, temperature 98.1 F (36.7 C), temperature source Temporal, resp. rate 14, height 5' (1.524 m), weight 130 lb 4.8 oz (59.1 kg), SpO2 96 %. Body mass index is 25.45 kg/m.    Physical Exam Vitals reviewed.  Constitutional:      Appearance: She is well-developed.     Comments: Delightful as always. Cooperative with the exam. Hoarseness present.  HENT:     Head: Normocephalic and atraumatic.     Right Ear: Tympanic membrane, ear canal and external ear normal.     Left Ear: Tympanic membrane, ear canal and external ear normal.     Nose: No nasal  deformity, septal deviation, mucosal edema or rhinorrhea.     Right Turbinates: Enlarged, swollen and pale.     Left Turbinates: Enlarged, swollen and pale.     Right Sinus: No maxillary sinus tenderness or frontal sinus tenderness.     Left Sinus: No maxillary sinus tenderness or frontal sinus tenderness.     Comments: No nasal polyps noted. Turbinates are erythematous.    Mouth/Throat:     Lips: Pink.     Mouth: Mucous membranes are moist. Mucous membranes are not pale and not dry.     Pharynx: Oropharynx is clear. Uvula midline.     Tonsils: No tonsillar exudate. 1+ on the right. 1+ on the left.     Comments: Cobblestoning present in the posterior oropharynx.  Eyes:     General: Allergic shiner  present.        Right eye: No discharge.        Left eye: No discharge.     Conjunctiva/sclera: Conjunctivae normal.     Right eye: Right conjunctiva is not injected. No chemosis.    Left eye: Left conjunctiva is not injected. No chemosis.    Pupils: Pupils are equal, round, and reactive to light.  Cardiovascular:     Rate and Rhythm: Normal rate and regular rhythm.     Heart sounds: Normal heart sounds.  Pulmonary:     Effort: Pulmonary effort is normal. No tachypnea, accessory muscle usage or respiratory distress.     Breath sounds: Examination of the right-lower field reveals decreased breath sounds. Examination of the left-lower field reveals decreased breath sounds. Decreased breath sounds present. No wheezing, rhonchi or rales.     Comments: Moving air well in all lung fields. She does have her baseline hoarseness. There are no crackles today.  Chest:     Chest wall: No tenderness.  Lymphadenopathy:     Cervical: No cervical adenopathy.  Skin:    Coloration: Skin is not pale.     Findings: No abrasion, erythema, petechiae or rash. Rash is not papular, urticarial or vesicular.  Neurological:     Mental Status: She is alert.  Psychiatric:        Behavior: Behavior is cooperative.       Diagnostic studies:    Spirometry: results normal (FEV1: 1.43/85%, FVC: 1.75/77%, FEV1/FVC: 82%).    Spirometry consistent with possible restrictive disease. This is very stable for her.   Allergy Studies: none        [name], MD  [facility]

## 2022-06-18 NOTE — Patient Instructions (Addendum)
1. Common variable immunodeficiency - We will continue with the current dosing of Hyqvia (38 grams each time).  - We will get some routine labs toady. - Continue with the premedication.  - We are also going to get some labs to evaluate your fatigue, including vitamin B levels again toi see if those are dropping.   2. Severe persistent asthma, uncomplicated - Lung testing looked amazing.  - I will send my note to Dr. Isaiah Serge and Dr. Corliss Skains to keep them in the loop..  - Daily controller medication(s): Breo 200/25 mcg one puff once daily - Prior to physical activity: ProAir 2 puffs 10-15 minutes before physical activity. - Rescue medications: ProAir 4 puffs every 4-6 hours as needed or albuterol nebulizer one vial every 4-6 hours as needed - Changes during respiratory infections or worsening symptoms: Add on Arnuity to  one inhalation  twice daily for TWO WEEKS. - You can also add on albuterol/Pulmicort neb BEFORE the Acapella device to open things up for a few weeks during flares.  - Asthma control goals:  * Full participation in all desired activities (may need albuterol before activity) * Albuterol use two time or less a week on average (not counting use with activity) * Cough interfering with sleep two time or less a month * Oral steroids no more than once a year * No hospitalizations  3. Chronic rhinitis - Continue with Flonase one spray per nostril daily. - Consider using nasal saline rinses prior to using the Flonase.  - I think Allegra every other day should be fine.   4. Gastroesophageal reflux disease - Continue with omeprazole at the current dosing.  5. Return in about 3 months (around 09/18/2022).    Please inform us of any Emergency Department visits, hospitalizations, or changes in symptoms. Call us before going to the ED for breathing or allergy symptoms since we might be able to fit you in for a sick visit. Feel free to contact us anytime with any questions,  problems, or concerns.  It was a pleasure to see you again today!  Websites that have reliable patient information: 1. American Academy of Asthma, Allergy, and Immunology: www.aaaai.org 2. Food Allergy Research and Education (FARE): foodallergy.org 3. Mothers of Asthmatics: http://www.asthmacommunitynetwork.org 4. American College of Allergy, Asthma, and Immunology: www.acaai.org   COVID-19 Vaccine Information can be found at: [email or web address] For questions related to vaccine distribution or appointments, please email [email or web address] or call [phone].   We realize that you might be concerned about having an allergic reaction to the COVID19 vaccines. To help with that concern, WE ARE OFFERING THE COVID19 VACCINES IN OUR OFFICE! Ask the front desk for dates!     "Like" [place] on Facebook and Instagram for our latest updates!      A healthy democracy works best when [employer] participate! Make sure you are registered to vote! If you have moved or changed any of your contact information, you will need to get this updated before voting!  In some cases, you MAY be able to register to vote online: [email or web address]

## 2022-06-21 NOTE — Progress Notes (Signed)
Office Visit Note  Patient: Janet Garcia             Date of Birth: 1946/06/15           MRN: [id number]             PCP: [name], MD Referring: Chilton Greathouse, MD Visit Date: 07/04/2022 Occupation: @GUAROCC @  Subjective:  Positive ANA, ILD  History of Present Illness: Janet Garcia is a 76 y.o. female in consultation per request of Dr. 73 for the evaluation of positive ANA with ILD.  The patient her symptoms a started in 2017 with fatigue and bronchitis.  She states she was hospitalized with pneumonia and had detailed workup.  She was diagnosed with bronchiectasis.  She was under the care of a pulmonologist who discharged he from his care.  As she states her symptoms gradually improved with intermittent infections.  She states in December 2022 she was hospitalized with pneumonia.  She started seeing Dr. January 2023 who diagnosed her with C VID.  Due to frequent infection she was a started on HYQVIA and she was also referred to Dr. Dellis Anes.  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  She had repeat high-resolution CT in August 2023.  She had extensive lab work which showed low titer positive ANA and positive Ro antibody.  She was referred to me for further evaluation.  Patient states she has dry mouth and dry eyes symptoms which she relates to the use of diltiazem.  She denies any history of oral ulcers, nasal ulcers, malar rash, Raynaud's, photosensitivity or lymphadenopathy.  She states she has osteoarthritis in her hands for which she is seeing an September 2023.  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  There is no history of inflammatory arthritis.  She has had physical therapy for her knee joints which was helpful.  She also has chronic lower back pain due to degenerative disc disease of the lumbar spine.  She is followed by back a specialist.  She was diagnosed with osteoporosis several years ago.  She was treated with Fosamax which  was discontinued eventually.  She took Prolia for 2 years but as her osteoporosis worsened and she had increased vertebral fractures she was switched to [place] in April 2023.  She has been tolerating Evenity injections without any side effects.  There is no family history of autoimmune disease.  She is gravida 2, para 1, miscarriage 1.    Activities of Daily Living:  Patient reports morning stiffness for 0 minutes.   Patient Denies nocturnal pain.  Difficulty dressing/grooming: Denies Difficulty climbing stairs: Reports Difficulty getting out of chair: Reports Difficulty using hands for taps, buttons, cutlery, and/or writing: Denies  Review of Systems  Constitutional:  Positive for fatigue.  HENT:  Positive for mouth dryness. Negative for mouth sores.   Eyes:  Positive for dryness.  Respiratory:  Negative for shortness of breath.   Cardiovascular:  Negative for chest pain and palpitations.  Gastrointestinal:  Positive for constipation. Negative for blood in stool and diarrhea.  Endocrine: Negative for increased urination.  Genitourinary:  Negative for difficulty urinating.  Musculoskeletal:  Positive for joint pain and joint pain. Negative for gait problem, joint swelling, myalgias, muscle weakness, morning stiffness, muscle tenderness and myalgias.  Skin:  Negative for color change, rash, hair loss and sensitivity to sunlight.  Allergic/Immunologic: Negative for susceptible to infections.  Neurological:  Positive for headaches. Negative for dizziness.  Hematological:  Negative for  swollen glands.  Psychiatric/Behavioral:  Positive for depressed mood. Negative for sleep disturbance. The patient is nervous/anxious.     PMFS History:  Patient Active Problem List   Diagnosis Date Noted   Closed wedge compression fracture of T7 vertebra with delayed healing 05/04/2021   Closed wedge compression fracture of T9 vertebra with delayed healing 05/04/2021   Age-related osteoporosis with  current pathological fracture with delayed healing 02/02/2021   History of vertebral compression fracture 06/29/2020   Symptomatic PVCs 07/29/2019   Diastolic dysfunction 06/30/2019   Osteoarthritis of left hip 03/05/2018   Aortic atherosclerosis (HCC) 01/19/2018   B12 deficiency 05/10/2017   Insomnia 03/25/2017   Closed compression fracture of L3 vertebra (HCC) 08/06/2016   Recurrent infections 02/24/2016   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/24/2016   Severe persistent asthma 02/24/2016   Chronic rhinitis 02/24/2016   Anemia of chronic disease 02/24/2016   Opacity of lung on imaging study 02/24/2016   Cataract 10/11/2015   Moderate persistent asthma 09/15/2015   Esophageal reflux 09/15/2015   Essential hypertension 09/15/2015   Abnormal liver enzymes 07/26/2015   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   H/O total hip arthroplasty 07/26/2015   Lumbosacral spondylosis 07/26/2015   Asthma, mild persistent 07/05/2015   Abnormal CAT scan 06/30/2015   PNA (pneumonia) 06/21/2015   Anxiety 05/23/2015   HLD (hyperlipidemia) 03/24/2015   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   Family history of colonic polyps 01/13/2015   Recurrent disease 11/21/2014   Essential (primary) hypertension 08/15/2014   Anxiety, generalized 08/15/2014   Left lower lobe pneumonia 04/16/2014   Arthritis, degenerative 04/16/2014   Primary osteoarthritis involving multiple joints 04/16/2014   Chronic infection of sinus 03/15/2014   Bronchiectasis (HCC) 03/10/2014   H/O respiratory system disease 03/10/2014   Can't get food down 03/01/2014   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   Lumbar radiculopathy 02/28/2014   Headache, migraine 02/28/2014   Osteopenia 02/28/2014   Airway hyperreactivity 02/24/2014   Closed nondisplaced fracture of proximal phalanx of lesser toe of right foot 01/24/2014   Allergic rhinitis 11/05/2013   Degeneration of intervertebral disc of  lumbosacral region 11/05/2013   Esophagitis, reflux 11/05/2013   Perennial allergic rhinitis with seasonal variation 11/05/2013    Past Medical History:  Diagnosis Date   Bronchiectasis (HCC)    Immune deficiency disorder (HCC)    Pneumonia     Family History  Problem Relation Age of Onset   Hypertension Mother    Diverticulitis Mother    Healthy Daughter    Macular degeneration Daughter    Allergic rhinitis Neg Hx    Angioedema Neg Hx    Asthma Neg Hx    Eczema Neg Hx    Immunodeficiency Neg Hx    Urticaria Neg Hx    Past Surgical History:  Procedure Laterality Date   KYPHOPLASTY  04/2021   TOTAL HIP ARTHROPLASTY Right    Social History   Social History Narrative   Not on file   Immunization History  Administered Date(s) Administered   Influenza,inj,Quad PF,6+ Mos 08/06/2021   PFIZER(Purple Top)SARS-COV-2 Vaccination 08/27/2019, 09/17/2019, 04/27/2020, 01/21/2021   Pneumococcal Conjugate-13 06/20/2014   Pneumococcal Polysaccharide-23 08/08/2011   Pneumococcal-Unspecified 08/08/2011     Objective: Vital Signs: BP (!) 155/75 (BP Location: Left Arm, Patient Position: Sitting, Cuff Size: Normal)   Pulse 94   Resp 13   Ht 5' (1.524 m)   Wt 130 lb 12.8 oz (59.3 kg)   BMI 25.55 kg/m    Physical Exam Vitals and  nursing note reviewed.  Constitutional:      Appearance: She is well-developed.  HENT:     Head: Normocephalic and atraumatic.  Eyes:     Conjunctiva/sclera: Conjunctivae normal.  Cardiovascular:     Rate and Rhythm: Normal rate and regular rhythm.     Heart sounds: Normal heart sounds.  Pulmonary:     Effort: Pulmonary effort is normal.     Breath sounds: Normal breath sounds.  Abdominal:     General: Bowel sounds are normal.     Palpations: Abdomen is soft.  Musculoskeletal:     Cervical back: Normal range of motion.  Lymphadenopathy:     Cervical: No cervical adenopathy.  Skin:    General: Skin is warm and dry.     Capillary Refill:  Capillary refill takes less than 2 seconds.  Neurological:     Mental Status: She is alert and oriented to person, place, and time.  Psychiatric:        Behavior: Behavior normal.      Musculoskeletal Exam: Cervical spine was in good range of motion.  She had thoracic kyphosis with limited range of motion of thoracic and lumbar spine.  She had painful range of motion of lumbar spine.  Shoulders, elbows, wrists, MCPs PIPs and DIPs were in good range of motion.  She had bilateral PIP and DIP thickening with no synovitis.  Hip joints and knee joints with good range of motion.  She had no tenderness over ankles or MTPs.  CDAI Exam: CDAI Score: -- Patient Global: --; Provider Global: -- Swollen: --; Tender: -- Joint Exam 07/04/2022   No joint exam has been documented for this visit   There is currently no information documented on the homunculus. Go to the Rheumatology activity and complete the homunculus joint exam.  Investigation: No additional findings.  Imaging: No results found.  Recent Labs: Lab Results  Component Value Date   WBC 5.3 06/18/2022   HGB 13.1 06/18/2022   PLT 263 07/27/2021   NA 142 06/18/2022   K 4.4 06/18/2022   CL 103 06/18/2022   CO2 22 06/18/2022   GLUCOSE 104 (H) 06/18/2022   BUN 12 06/18/2022   CREATININE 0.94 06/18/2022   BILITOT <0.2 06/18/2022   ALKPHOS 141 (H) 06/18/2022   AST 27 06/18/2022   ALT 19 06/18/2022   PROT 7.0 06/18/2022   ALBUMIN 4.1 06/18/2022   CALCIUM 9.0 06/18/2022   GFRAA 83 02/22/2020    Speciality Comments: No specialty comments available.  Procedures:  No procedures performed Allergies: Fish allergy, Guaifenesin, Guaifenesin er, Levofloxacin, Codeine, Elemental sulfur, Erythromycin, and Sulfa antibiotics   Assessment / Plan:     Visit Diagnoses: ILD (interstitial lung disease) (HCC)-I reviewed previous records.  Patient had been under care of Dr. Isaiah Serge since February 2023.  She had high-resolution CT in February  2023 and again in August 2023.  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  Dr. Isaiah Serge obtained PFTs that showed moderate restriction of the diffusion defect.  He discussed possible use of Ofev for the patient.  He also did extensive labs which showed positive ANA and positive Ro antibody.  SSA and SSB antibodies were negative in the past.  Patient gives history of mild dry mouth symptoms.  She relates the symptoms to the use of diltiazem.  ANA is low titer and not significant.  She had no other clinical features of autoimmune disease.  She denies any history of muscle weakness or rash.  There is no  history of oral ulcers nasal ulcers, malar rash, Raynaud's phenomenon or lymphadenopathy.  No inflammatory arthritis was noted.  At this point I cannot say that her ILD is associated with an autoimmune disease.  Positive ANA (antinuclear antibody) - 09/25/21: ANA 1:40NH, RF<14, anti-CCP<16, ANCA negative, RF<14, anti-CCP<16, scl-70-, myomarker panel: SS-A 28, rest of panel negative -she gives history of mild dry mouth and dry eye symptoms which she relates to the use of diltiazem.  There is no history of oral ulcers, nasal ulcers, malar rash, lymphadenopathy, Raynaud's, inflammatory arthritis.  ANA is low titer positive which is not significant.  Sjogren's antibodies were negative in the past.  I will obtain some additional antibodies today.  Plan: ANA, RNP Antibody, Anti-Smith antibody, Anti-DNA antibody, double-stranded, C3 and C4  Primary osteoarthritis of both hands-she had bilateral PIP and DIP thickening.  No synovitis was noted.  Primary osteoarthritis of left hip-she had good range of motion of her hip joint.  Chondromalacia of both patella-patient states she has knee joint discomfort.  She had x-rays in the past and was diagnosed with chondromalacia patella.  Her symptoms improved after physical therapy.  DDD (degenerative disc disease), lumbar-she is followed by back a specialist.  She continues to  have severe lower back pain.  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  She is on HYQVIA  Other medical problems are listed as follows:  Common variable agammaglobulinemia (HCC)  Essential (primary) hypertension-blood pressure was elevated today.  Her blood pressure was repeated which was a still elevated.  She was advised to monitor blood pressure closely and follow-up with PCP.  Diastolic dysfunction  Aortic atherosclerosis (HCC)  Mixed hyperlipidemia  Severe persistent asthma without complication  Bronchiectasis without complication (HCC)  History of gastroesophageal reflux (GERD)  Abnormal liver enzymes  Anemia of chronic disease  Anxiety  Hx of migraines  Other fatigue - Plan: CK  Orders: Orders Placed This Encounter  Procedures   ANA   RNP Antibody   Anti-Smith antibody   Anti-DNA antibody, double-stranded   C3 and C4   CK   No orders of the defined types were placed in this encounter.    Follow-Up Instructions: Return for ILD, +ANA.   Pollyann Savoy, MD  Note - This record has been created using Animal nutritionist.  Chart creation errors have been sought, but may not always  have been located. Such creation errors do not reflect on  the standard of medical care.

## 2022-06-24 LAB — IGG 1, 2, 3, AND 4
IgG (Immunoglobin G), Serum: 1205 mg/dL (ref 586–1602)
IgG, Subclass 1: 621 mg/dL (ref 248–810)
IgG, Subclass 2: 532 mg/dL (ref 130–555)
IgG, Subclass 3: 43 mg/dL (ref 15–102)
IgG, Subclass 4: 32 mg/dL (ref 2–96)

## 2022-06-24 LAB — CBC WITH DIFFERENTIAL
Basophils Absolute: 0.1 10*3/uL (ref 0.0–0.2)
Basos: 1 %
EOS (ABSOLUTE): 0.1 10*3/uL (ref 0.0–0.4)
Eos: 2 %
Hematocrit: 38.1 % (ref 34.0–46.6)
Hemoglobin: 13.1 g/dL (ref 11.1–15.9)
Immature Grans (Abs): 0 10*3/uL (ref 0.0–0.1)
Immature Granulocytes: 0 %
Lymphocytes Absolute: 1 10*3/uL (ref 0.7–3.1)
Lymphs: 18 %
MCH: 33.5 pg — ABNORMAL HIGH (ref 26.6–33.0)
MCHC: 34.4 g/dL (ref 31.5–35.7)
MCV: 97 fL (ref 79–97)
Monocytes Absolute: 0.5 10*3/uL (ref 0.1–0.9)
Monocytes: 10 %
Neutrophils Absolute: 3.6 10*3/uL (ref 1.4–7.0)
Neutrophils: 69 %
RBC: 3.91 x10E6/uL (ref 3.77–5.28)
RDW: 12.6 % (ref 11.7–15.4)
WBC: 5.3 10*3/uL (ref 3.4–10.8)

## 2022-06-24 LAB — VITAMIN B12: Vitamin B-12: 412 pg/mL (ref 232–1245)

## 2022-06-24 LAB — CMP14+EGFR
ALT: 19 IU/L (ref 0–32)
AST: 27 IU/L (ref 0–40)
Albumin/Globulin Ratio: 1.4 (ref 1.2–2.2)
Albumin: 4.1 g/dL (ref 3.8–4.8)
Alkaline Phosphatase: 141 IU/L — ABNORMAL HIGH (ref 44–121)
BUN/Creatinine Ratio: 13 (ref 12–28)
BUN: 12 mg/dL (ref 8–27)
Bilirubin Total: <0.2 mg/dL (ref 0.0–1.2)
CO2: 22 mmol/L (ref 20–29)
Calcium: 9 mg/dL (ref 8.7–10.3)
Chloride: 103 mmol/L (ref 96–106)
Creatinine, Ser: 0.94 mg/dL (ref 0.57–1.00)
Globulin, Total: 2.9 g/dL (ref 1.5–4.5)
Glucose: 104 mg/dL — ABNORMAL HIGH (ref 70–99)
Potassium: 4.4 mmol/L (ref 3.5–5.2)
Sodium: 142 mmol/L (ref 134–144)
Total Protein: 7 g/dL (ref 6.0–8.5)
eGFR: 63 mL/min/{1.73_m2} (ref >59)

## 2022-06-24 LAB — VITAMIN B1: Thiamine: 109.3 nmol/L (ref 66.5–200.0)

## 2022-06-24 LAB — IGG, IGA, IGM
IgA/Immunoglobulin A, Serum: 411 mg/dL (ref 64–422)
IgM (Immunoglobulin M), Srm: 48 mg/dL (ref 26–217)

## 2022-06-24 LAB — VITAMIN B6: Vitamin B6: 23.8 ug/L (ref 3.4–65.2)

## 2022-06-26 ENCOUNTER — Encounter: Payer: Self-pay | Admitting: Allergy & Immunology

## 2022-06-26 NOTE — Addendum Note (Signed)
Addended by: Alfonse Spruce on: 06/26/2022 05:42 AM   Modules accepted: Orders

## 2022-06-26 NOTE — Progress Notes (Signed)
noted 

## 2022-07-04 ENCOUNTER — Encounter: Payer: Self-pay | Admitting: Rheumatology

## 2022-07-04 ENCOUNTER — Ambulatory Visit: Payer: Medicare Other | Attending: Rheumatology | Admitting: Rheumatology

## 2022-07-04 VITALS — BP 155/75 | HR 94 | Resp 13 | Ht 60.0 in | Wt 130.8 lb

## 2022-07-04 DIAGNOSIS — R768 Other specified abnormal immunological findings in serum: Secondary | ICD-10-CM

## 2022-07-04 DIAGNOSIS — D801 Nonfamilial hypogammaglobulinemia: Secondary | ICD-10-CM | POA: Diagnosis present

## 2022-07-04 DIAGNOSIS — I700 Atherosclerosis of aorta: Secondary | ICD-10-CM

## 2022-07-04 DIAGNOSIS — J4550 Severe persistent asthma, uncomplicated: Secondary | ICD-10-CM | POA: Diagnosis present

## 2022-07-04 DIAGNOSIS — M2242 Chondromalacia patellae, left knee: Secondary | ICD-10-CM | POA: Diagnosis present

## 2022-07-04 DIAGNOSIS — M1612 Unilateral primary osteoarthritis, left hip: Secondary | ICD-10-CM

## 2022-07-04 DIAGNOSIS — S22070G Wedge compression fracture of T9-T10 vertebra, subsequent encounter for fracture with delayed healing: Secondary | ICD-10-CM

## 2022-07-04 DIAGNOSIS — R5383 Other fatigue: Secondary | ICD-10-CM

## 2022-07-04 DIAGNOSIS — M5136 Other intervertebral disc degeneration, lumbar region: Secondary | ICD-10-CM | POA: Diagnosis present

## 2022-07-04 DIAGNOSIS — M2241 Chondromalacia patellae, right knee: Secondary | ICD-10-CM | POA: Diagnosis present

## 2022-07-04 DIAGNOSIS — I493 Ventricular premature depolarization: Secondary | ICD-10-CM

## 2022-07-04 DIAGNOSIS — Z8719 Personal history of other diseases of the digestive system: Secondary | ICD-10-CM | POA: Diagnosis present

## 2022-07-04 DIAGNOSIS — D638 Anemia in other chronic diseases classified elsewhere: Secondary | ICD-10-CM

## 2022-07-04 DIAGNOSIS — I10 Essential (primary) hypertension: Secondary | ICD-10-CM

## 2022-07-04 DIAGNOSIS — E782 Mixed hyperlipidemia: Secondary | ICD-10-CM

## 2022-07-04 DIAGNOSIS — I5189 Other ill-defined heart diseases: Secondary | ICD-10-CM

## 2022-07-04 DIAGNOSIS — M19041 Primary osteoarthritis, right hand: Secondary | ICD-10-CM

## 2022-07-04 DIAGNOSIS — S22060G Wedge compression fracture of T7-T8 vertebra, subsequent encounter for fracture with delayed healing: Secondary | ICD-10-CM

## 2022-07-04 DIAGNOSIS — F419 Anxiety disorder, unspecified: Secondary | ICD-10-CM | POA: Diagnosis present

## 2022-07-04 DIAGNOSIS — J479 Bronchiectasis, uncomplicated: Secondary | ICD-10-CM

## 2022-07-04 DIAGNOSIS — J849 Interstitial pulmonary disease, unspecified: Secondary | ICD-10-CM

## 2022-07-04 DIAGNOSIS — M5137 Other intervertebral disc degeneration, lumbosacral region: Secondary | ICD-10-CM

## 2022-07-04 DIAGNOSIS — M19042 Primary osteoarthritis, left hand: Secondary | ICD-10-CM

## 2022-07-04 DIAGNOSIS — M8000XG Age-related osteoporosis with current pathological fracture, unspecified site, subsequent encounter for fracture with delayed healing: Secondary | ICD-10-CM | POA: Diagnosis present

## 2022-07-04 DIAGNOSIS — R748 Abnormal levels of other serum enzymes: Secondary | ICD-10-CM

## 2022-07-04 DIAGNOSIS — Z8669 Personal history of other diseases of the nervous system and sense organs: Secondary | ICD-10-CM | POA: Diagnosis present

## 2022-07-04 DIAGNOSIS — D839 Common variable immunodeficiency, unspecified: Secondary | ICD-10-CM | POA: Diagnosis present

## 2022-07-04 DIAGNOSIS — M5416 Radiculopathy, lumbar region: Secondary | ICD-10-CM

## 2022-07-06 LAB — ANTI-SMITH ANTIBODY: ENA SM Ab Ser-aCnc: <1 AI

## 2022-07-06 LAB — C3 AND C4
C3 Complement: 139 mg/dL (ref 83–193)
C4 Complement: 37 mg/dL (ref 15–57)

## 2022-07-06 LAB — ANA: Anti Nuclear Antibody (ANA): POSITIVE — AB

## 2022-07-06 LAB — ANTI-NUCLEAR AB-TITER (ANA TITER): ANA Titer 1: 1:40 {titer} — ABNORMAL HIGH

## 2022-07-06 LAB — CK: Total CK: 76 U/L (ref 29–143)

## 2022-07-06 LAB — ANTI-DNA ANTIBODY, DOUBLE-STRANDED: ds DNA Ab: <1 IU/mL

## 2022-07-06 LAB — RNP ANTIBODY: Ribonucleic Protein(ENA) Antibody, IgG: <1 AI

## 2022-07-08 NOTE — Progress Notes (Signed)
I will discuss results at the fu visit.

## 2022-07-22 NOTE — Progress Notes (Signed)
Office Visit Note  Patient: Janet Garcia             Date of Birth: Feb 12, 1946           MRN: [id number]             PCP: [name], MD Referring: [name], PA-C Visit Date: 07/25/2022 Occupation: @GUAROCC @  Subjective:  Positive ANA and ILD  History of Present Illness: Janet Garcia is a 77 y.o. female returns today for follow-up visit for the evaluation of positive ANA.  She denies any history of oral ulcers, nasal ulcers, malar rash, photosensitivity, Raynaud's phenomenon, lymphadenopathy, inflammatory arthritis.  She has mild sicca symptoms which she relates to medication use.  She continues to have mild shortness of breath which is episodic.  Continues to have some discomfort in her hands due to underlying osteoarthritis which she denies any history of joint swelling.  Activities of Daily Living:  Patient reports morning stiffness for 0 minutes.   Patient Denies nocturnal pain.  Difficulty dressing/grooming: Denies Difficulty climbing stairs: Denies Difficulty getting out of chair: Denies Difficulty using hands for taps, buttons, cutlery, and/or writing: Denies  Review of Systems  Constitutional:  Negative for fatigue.  HENT:  Positive for mouth dryness. Negative for mouth sores.   Eyes:  Positive for dryness.  Respiratory:  Negative for shortness of breath.   Cardiovascular:  Negative for chest pain and palpitations.  Gastrointestinal:  Positive for constipation. Negative for blood in stool and diarrhea.  Endocrine: Negative for increased urination.  Genitourinary:  Negative for involuntary urination.  Musculoskeletal:  Negative for joint pain, gait problem, joint pain, joint swelling, myalgias, muscle weakness, morning stiffness, muscle tenderness and myalgias.  Skin:  Negative for color change, rash, hair loss and sensitivity to sunlight.  Allergic/Immunologic: Positive for susceptible to infections.  Neurological:  Negative for dizziness and headaches.   Hematological:  Negative for swollen glands.  Psychiatric/Behavioral:  Negative for depressed mood and sleep disturbance. The patient is not nervous/anxious.     PMFS History:  Patient Active Problem List   Diagnosis Date Noted   Closed wedge compression fracture of T7 vertebra with delayed healing 05/04/2021   Closed wedge compression fracture of T9 vertebra with delayed healing 05/04/2021   Age-related osteoporosis with current pathological fracture with delayed healing 02/02/2021   History of vertebral compression fracture 06/29/2020   Symptomatic PVCs 08/65/7846   Diastolic dysfunction 96/29/5284   Osteoarthritis of left hip 03/05/2018   Aortic atherosclerosis ([place]) 01/19/2018   B12 deficiency 05/10/2017   Insomnia 03/25/2017   Closed compression fracture of L3 vertebra ([place]) 08/06/2016   Recurrent infections 02/24/2016   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[place]) 02/24/2016   Severe persistent asthma 02/24/2016   Chronic rhinitis 02/24/2016   Anemia of chronic disease 02/24/2016   Opacity of lung on imaging study 02/24/2016   Cataract 10/11/2015   Moderate persistent asthma 09/15/2015   Esophageal reflux 09/15/2015   Essential hypertension 09/15/2015   Abnormal liver enzymes 07/26/2015   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   H/O total hip arthroplasty 07/26/2015   Lumbosacral spondylosis 07/26/2015   Asthma, mild persistent 07/05/2015   Abnormal CAT scan 06/30/2015   PNA (pneumonia) 06/21/2015   Anxiety 05/23/2015   HLD (hyperlipidemia) 03/24/2015   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   Family history of colonic polyps 01/13/2015   Recurrent disease 11/21/2014   Essential (primary) hypertension 08/15/2014   Anxiety, generalized 08/15/2014   Left lower lobe pneumonia 04/16/2014   Arthritis, degenerative  04/16/2014   Primary osteoarthritis involving multiple joints 04/16/2014   Chronic infection of sinus 03/15/2014   Bronchiectasis ([place])  03/10/2014   H/O respiratory system disease 03/10/2014   Can't get food down 03/01/2014   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   Lumbar radiculopathy 02/28/2014   Headache, migraine 02/28/2014   Osteopenia 02/28/2014   Airway hyperreactivity 02/24/2014   Closed nondisplaced fracture of proximal phalanx of lesser toe of right foot 01/24/2014   Allergic rhinitis 11/05/2013   Degeneration of intervertebral disc of lumbosacral region 11/05/2013   Esophagitis, reflux 11/05/2013   Perennial allergic rhinitis with seasonal variation 11/05/2013    Past Medical History:  Diagnosis Date   Bronchiectasis ([place])    Immune deficiency disorder ([place])    Pneumonia     Family History  Problem Relation Age of Onset   Hypertension Mother    Diverticulitis Mother    Healthy Daughter    Macular degeneration Daughter    Allergic rhinitis Neg Hx    Angioedema Neg Hx    Asthma Neg Hx    Eczema Neg Hx    Immunodeficiency Neg Hx    Urticaria Neg Hx    Past Surgical History:  Procedure Laterality Date   KYPHOPLASTY  04/2021   TOTAL HIP ARTHROPLASTY Right    Social History   Social History Narrative   Not on file   Immunization History  Administered Date(s) Administered   Influenza,inj,Quad PF,6+ Mos 08/06/2021   PFIZER(Purple Top)SARS-COV-2 Vaccination 08/27/2019, 09/17/2019, 04/27/2020, 01/21/2021   Pneumococcal Conjugate-13 06/20/2014   Pneumococcal Polysaccharide-23 08/08/2011   Pneumococcal-Unspecified 08/08/2011     Objective: Vital Signs: BP (!) 143/87 (BP Location: Left Arm, Patient Position: Sitting, Cuff Size: Normal)   Pulse 87   Resp 13   Ht 5' (1.524 m)   Wt 131 lb 6.4 oz (59.6 kg)   BMI 25.66 kg/m    Physical Exam Vitals and nursing note reviewed.  Constitutional:      Appearance: She is well-developed.  HENT:     Head: Normocephalic and atraumatic.  Eyes:     Conjunctiva/sclera: Conjunctivae normal.  Cardiovascular:     Rate and Rhythm: Normal rate and  regular rhythm.     Heart sounds: Normal heart sounds.  Pulmonary:     Effort: Pulmonary effort is normal.     Breath sounds: Normal breath sounds.  Abdominal:     General: Bowel sounds are normal.     Palpations: Abdomen is soft.  Musculoskeletal:     Cervical back: Normal range of motion.  Lymphadenopathy:     Cervical: No cervical adenopathy.  Skin:    General: Skin is warm and dry.     Capillary Refill: Capillary refill takes less than 2 seconds.  Neurological:     Mental Status: She is alert and oriented to person, place, and time.  Psychiatric:        Behavior: Behavior normal.      Musculoskeletal Exam: Cervical spine was in good range of motion.  She had thoracic kyphosis.  She had no tenderness over thoracic or lumbar spine.  Shoulder joints, elbow joints, wrist joints, MCPs PIPs been good range of motion.  She had bilateral PIP and DIP thickening with no synovitis.  She had limited range of motion of her hip joints.  Right hip joint has been replaced.  Knee joints with good range of motion.  There was no tenderness over ankles or MTPs.  CDAI Exam: CDAI Score: -- Patient Global: --; Provider Global: --  Swollen: --; Tender: -- Joint Exam 07/25/2022   No joint exam has been documented for this visit   There is currently no information documented on the homunculus. Go to the Rheumatology activity and complete the homunculus joint exam.  Investigation: No additional findings.  Imaging: No results found.  Recent Labs: Lab Results  Component Value Date   WBC 5.3 06/18/2022   HGB 13.1 06/18/2022   PLT 263 07/27/2021   NA 142 06/18/2022   K 4.4 06/18/2022   CL 103 06/18/2022   CO2 22 06/18/2022   GLUCOSE 104 (H) 06/18/2022   BUN 12 06/18/2022   CREATININE 0.94 06/18/2022   BILITOT <0.2 06/18/2022   ALKPHOS 141 (H) 06/18/2022   AST 27 06/18/2022   ALT 19 06/18/2022   PROT 7.0 06/18/2022   ALBUMIN 4.1 06/18/2022   CALCIUM 9.0 06/18/2022   GFRAA 83 02/22/2020    02/01/2022 ANA 1: 40NH, Smith negative, dsDNA negative, RNP negative, C3-C4 normal, CK 76   Speciality Comments: No specialty comments available.  Procedures:  No procedures performed Allergies: Fish allergy, Guaifenesin, Guaifenesin er, Levofloxacin, Codeine, Elemental sulfur, Erythromycin, and Sulfa antibiotics   Assessment / Plan:     Visit Diagnoses: ILD (interstitial lung disease) ([place]) - Under care of Dr. Vaughan Browner.  HRCT on UIP pattern and PFTs moderate restriction of the diffusion defect.  No other clinical features of autoimmune disease.  Patient has episodic increased shortness of breath.  She states she is doing well currently.  Labs obtained on February 01, 2022 showed ANA 1: 40 nuclear homogenous which is not significant.  Other antibodies including Smith, double-stranded DNA, RNP were negative.  Complements were normal.  Her CK was normal.  Lab results were discussed with the patient at length.  She had SSA 28 antibody which can be seen with dermatomyositis.  She has no clinical features of dermatomyositis.  She has mild sicca symptoms.  SSA antibody for Sjogren's was negative.  She relates her sicca symptoms to her medications.  I advised her to contact me if she develops any new symptoms.  Positive ANA (antinuclear antibody) - ANA is low titer and not significant.  Smith, RNP, SCL 70 negative.  Biomarker panel showed SSA 28 which is usually associated with myositis.  CK is normal.  Primary osteoarthritis of both hands - Bilateral PIP and DIP thickening was noted.  Joint protection muscle strengthening was discussed.  Primary osteoarthritis of left hip - She had some limitation in range of motion without discomfort.  She had right total hip replacement in the past which also had limited range of motion.  Chondromalacia of both patellae - Symptoms improved after physical therapy per patient.  No warmth swelling a few was noted.  DDD (degenerative disc disease), lumbar - She has severe  chronic lower back pain.  She is probably followed by a back specialist per patient.  Closed wedge compression fracture of T9 vertebra with delayed healing  Closed wedge compression fracture of T7 vertebra with delayed healing  Age-related osteoporosis with current pathological fracture with delayed healing - She is on Evenity since April 2023.  Previous treatment include Fosamax followed by [facility] for 2 years.  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  Advised her to monitor blood pressure closely and follow-up with her PCP.  Diastolic dysfunction  Bronchiectasis without complication (  HCC)  Aortic atherosclerosis ([place])  Mixed hyperlipidemia  History of gastroesophageal reflux (GERD)  Abnormal liver enzymes  Severe persistent asthma without complication  Anemia of chronic disease  Anxiety  Hx of migraines  Orders: No orders of the defined types were placed in this encounter.  No orders of the defined types were placed in this encounter.    Follow-Up Instructions: No follow-ups on file.   Bo Merino, MD  Note - This record has been created using Editor, commissioning.  Chart creation errors have been sought, but may not always  have been located. Such creation errors do not reflect on  the standard of medical care.

## 2022-07-25 ENCOUNTER — Encounter: Payer: Self-pay | Admitting: Rheumatology

## 2022-07-25 ENCOUNTER — Ambulatory Visit: Payer: Medicare Other | Attending: Rheumatology | Admitting: Rheumatology

## 2022-07-25 VITALS — BP 143/87 | HR 87 | Resp 13 | Ht 60.0 in | Wt 131.4 lb

## 2022-07-25 DIAGNOSIS — Z8719 Personal history of other diseases of the digestive system: Secondary | ICD-10-CM

## 2022-07-25 DIAGNOSIS — J849 Interstitial pulmonary disease, unspecified: Secondary | ICD-10-CM | POA: Diagnosis not present

## 2022-07-25 DIAGNOSIS — I700 Atherosclerosis of aorta: Secondary | ICD-10-CM

## 2022-07-25 DIAGNOSIS — J479 Bronchiectasis, uncomplicated: Secondary | ICD-10-CM | POA: Diagnosis present

## 2022-07-25 DIAGNOSIS — E782 Mixed hyperlipidemia: Secondary | ICD-10-CM | POA: Diagnosis present

## 2022-07-25 DIAGNOSIS — R768 Other specified abnormal immunological findings in serum: Secondary | ICD-10-CM | POA: Diagnosis not present

## 2022-07-25 DIAGNOSIS — F419 Anxiety disorder, unspecified: Secondary | ICD-10-CM

## 2022-07-25 DIAGNOSIS — I10 Essential (primary) hypertension: Secondary | ICD-10-CM

## 2022-07-25 DIAGNOSIS — M8000XG Age-related osteoporosis with current pathological fracture, unspecified site, subsequent encounter for fracture with delayed healing: Secondary | ICD-10-CM | POA: Diagnosis present

## 2022-07-25 DIAGNOSIS — M1612 Unilateral primary osteoarthritis, left hip: Secondary | ICD-10-CM | POA: Diagnosis not present

## 2022-07-25 DIAGNOSIS — R748 Abnormal levels of other serum enzymes: Secondary | ICD-10-CM

## 2022-07-25 DIAGNOSIS — M19041 Primary osteoarthritis, right hand: Secondary | ICD-10-CM

## 2022-07-25 DIAGNOSIS — M51369 Other intervertebral disc degeneration, lumbar region without mention of lumbar back pain or lower extremity pain: Secondary | ICD-10-CM

## 2022-07-25 DIAGNOSIS — D638 Anemia in other chronic diseases classified elsewhere: Secondary | ICD-10-CM | POA: Diagnosis present

## 2022-07-25 DIAGNOSIS — M2242 Chondromalacia patellae, left knee: Secondary | ICD-10-CM | POA: Diagnosis present

## 2022-07-25 DIAGNOSIS — R7689 Other specified abnormal immunological findings in serum: Secondary | ICD-10-CM

## 2022-07-25 DIAGNOSIS — D801 Nonfamilial hypogammaglobulinemia: Secondary | ICD-10-CM | POA: Diagnosis present

## 2022-07-25 DIAGNOSIS — M5136 Other intervertebral disc degeneration, lumbar region: Secondary | ICD-10-CM | POA: Insufficient documentation

## 2022-07-25 DIAGNOSIS — Z8669 Personal history of other diseases of the nervous system and sense organs: Secondary | ICD-10-CM | POA: Diagnosis present

## 2022-07-25 DIAGNOSIS — J4550 Severe persistent asthma, uncomplicated: Secondary | ICD-10-CM

## 2022-07-25 DIAGNOSIS — I5189 Other ill-defined heart diseases: Secondary | ICD-10-CM

## 2022-07-25 DIAGNOSIS — D839 Common variable immunodeficiency, unspecified: Secondary | ICD-10-CM | POA: Diagnosis present

## 2022-07-25 DIAGNOSIS — S22060G Wedge compression fracture of T7-T8 vertebra, subsequent encounter for fracture with delayed healing: Secondary | ICD-10-CM | POA: Diagnosis present

## 2022-07-25 DIAGNOSIS — S22070G Wedge compression fracture of T9-T10 vertebra, subsequent encounter for fracture with delayed healing: Secondary | ICD-10-CM

## 2022-07-25 DIAGNOSIS — M19042 Primary osteoarthritis, left hand: Secondary | ICD-10-CM

## 2022-07-25 DIAGNOSIS — M2241 Chondromalacia patellae, right knee: Secondary | ICD-10-CM | POA: Diagnosis present

## 2022-07-26 NOTE — Telephone Encounter (Signed)
Reached out to patient. She has seen Dr. Estanislado Pandy twice since our last conversation with her. Patients appears to have no clinical features of autoimmune disease and no clinical feratures of dermatomyositis. She does have SSA 28 antibody. No f/u with rheum scheduled. Patient to f/u prn with rheum it appears  She states she  She said she is confident that she is over income threshold for patient assistance. She states even without the rental property sale information, she was advised by accountant that she is over threshold.  She would like to f/u with Dr. Vaughan Browner first. She also states that she would like to touch base with her insurance about the copay and how much she can expect to pay for medication. I did advise that it is unlikely that her insurance will be able to quote her price for the full year.  She will f/u with [place] if interested in starting.  Knox Saliva, PharmD, MPH, BCPS, CPP Clinical Pharmacist (Rheumatology and Pulmonology)

## 2022-08-08 ENCOUNTER — Other Ambulatory Visit: Payer: Self-pay | Admitting: Allergy & Immunology

## 2022-08-08 DIAGNOSIS — J310 Chronic rhinitis: Secondary | ICD-10-CM

## 2022-08-15 ENCOUNTER — Telehealth: Payer: Self-pay | Admitting: Pharmacist

## 2022-08-15 ENCOUNTER — Encounter: Payer: Self-pay | Admitting: Pulmonary Disease

## 2022-08-15 ENCOUNTER — Ambulatory Visit (INDEPENDENT_AMBULATORY_CARE_PROVIDER_SITE_OTHER): Payer: Medicare Other | Admitting: Pulmonary Disease

## 2022-08-15 VITALS — BP 134/82 | HR 71 | Temp 98.0°F | Ht 60.0 in | Wt 129.0 lb

## 2022-08-15 DIAGNOSIS — Z5181 Encounter for therapeutic drug level monitoring: Secondary | ICD-10-CM | POA: Diagnosis not present

## 2022-08-15 DIAGNOSIS — J479 Bronchiectasis, uncomplicated: Secondary | ICD-10-CM

## 2022-08-15 DIAGNOSIS — J849 Interstitial pulmonary disease, unspecified: Secondary | ICD-10-CM

## 2022-08-15 NOTE — Patient Instructions (Addendum)
Since ofev does not qualify for insurance support I will explore other alternatives such as pirfenidone and clinical trials Return to clinic in 1-2 months

## 2022-08-15 NOTE — Telephone Encounter (Addendum)
Patient is pirfenidone new start per Dr. Vaughan Browner. Have not yet received new start paperwork if it was completed at today's [place] a Prior Authorization request to [facility] for PIRFENIDONE via CoverMyMeds. Will update once we receive a response.  Key: Eugenie Filler, PharmD, MPH, BCPS, CPP Clinical Pharmacist (Rheumatology and Pulmonology)   ----- Message from Marshell Garfinkel, MD sent at 08/15/2022 11:11 AM EST ----- She is over the income cutoff for Ofev assistance.  Can we try to see if she will qualify for Esbriet or generic pirfenidone. Thanks

## 2022-08-15 NOTE — Progress Notes (Signed)
Janet Garcia    [id number]    08/20/1945  Primary Care Physician:Jobe, Alexis Goodell., MD  Referring Physician: [name], MD [address] [place] [address] [place],  [place] [address]  Chief complaint: Follow-up for Interstitial lung disease, asthma  HPI: 77 year old with history of severe persistent asthma, common variable immunodeficiency, GERD She follows with Dr. Ernst Bowler and is on immunoglobulin subcutaneous injections.  She has history of bronchiectasis previously followed by Dr. Camillo Flaming.  She has been referred for evaluation of bronchiectasis Complains of occasional productive cough, dyspnea with wheezing.  She has indigestion from GERD.  Pets: Cats Occupation: A Neurosurgeon Exposures: No mold, hot tub, Jacuzzi.  No feather pillows or comforters Smoking history: Never smoker Travel history: Previously lived in [place] Relevant family history: No family history of lung disease  Interim history: States that breathing is stable She saw Dr. Estanislado Pandy in early January 2024 for elevated SSA.  It was felt that she did not have any autoimmune process She is over the income cutoff for assistance with Ofev and is looking for alternative therapy.  Outpatient Encounter Medications as of 08/15/2022  Medication Sig   albuterol (PROAIR HFA) 108 (90 Base) MCG/ACT inhaler Inhale 2 puffs into the lungs every 4 (four) hours as needed.   albuterol (PROVENTIL) (2.5 MG/3ML) 0.083% nebulizer solution    Artificial Tear Solution (SYSTANE CONTACTS OP) Apply to eye.   aspirin 81 MG EC tablet Take 81 mg by mouth daily.   atorvastatin (LIPITOR) 20 MG tablet Take 20 mg by mouth.   BREO ELLIPTA 200-25 MCG/ACT AEPB INHALE 1 PUFF INTO THE LUNGS DAILY   budesonide (PULMICORT) 0.5 MG/2ML nebulizer solution Use one vial mixed with an albuterol nebulizer vial every 6 hours during periods of respiratory distress for 1-2 weeks at a time.   Calcium Carbonate-Vitamin  D 600-200 MG-UNIT TABS Take 2,000 tablets by mouth.    EPIPEN 2-PAK 0.3 MG/0.3ML SOAJ injection Inject 0.3 mLs (0.3 mg total) into the muscle as needed for anaphylaxis.   Esomeprazole Magnesium (NEXIUM 24HR PO) Take by mouth.   famotidine (PEPCID) 20 MG tablet Take 20 mg by mouth 2 (two) times daily.   fexofenadine (ALLEGRA) 180 MG tablet Take by mouth as needed.   fluticasone (FLONASE) 50 MCG/ACT nasal spray Administer 1 spray in each nostril daily.   ibuprofen (ADVIL) 200 MG tablet Take by mouth.   ibuprofen (ADVIL,MOTRIN) 200 MG tablet Take 200 mg by mouth as needed.   Immune Globulin-Hyaluronidase (HYQVIA) 30 GM/300ML KIT Inject 38 g into the skin every 21 ( twenty-one) days.   losartan (COZAAR) 25 MG tablet Take 50 mg by mouth daily.   metoprolol succinate (TOPROL-XL) 25 MG 24 hr tablet Take 25 mg by mouth daily.   montelukast (SINGULAIR) 10 MG tablet    Multiple Vitamin (MULTIVITAMIN) tablet Take by mouth.   polyethylene glycol powder (GLYCOLAX/MIRALAX) 17 GM/SCOOP powder Take by mouth.   Romosozumab-aqqg (EVENITY ) Inject into the skin. 1 x per month   [DISCONTINUED] diltiazem (CARDIZEM CD) 120 MG 24 hr capsule TAKE 1 CAPSULE(120 MG) BY MOUTH DAILY. FOLLOW UP APPOINTMENT   [DISCONTINUED] omeprazole (PRILOSEC) 20 MG capsule    [DISCONTINUED] vitamin B-12 (CYANOCOBALAMIN) 1000 MCG tablet Take by mouth.   No facility-administered encounter medications on file as of 08/15/2022.    Physical Exam: Blood pressure 134/82, pulse 71, temperature 98 F (36.7 C), temperature source Oral, height 5' (1.524 m), weight 129  lb (58.5 kg), SpO2 98 %. Gen:      No acute distress HEENT:  EOMI, sclera anicteric Neck:     No masses; no thyromegaly Lungs:    Clear to auscultation bilaterally; normal respiratory effort CV:         Regular rate and rhythm; no murmurs Abd:      + bowel sounds; soft, non-tender; no palpable masses, no distension Ext:    No edema; adequate peripheral perfusion Skin:       Warm and dry; no rash Neuro: alert and oriented x 3 Psych: normal mood and affect   Data Reviewed: Imaging: CT high-resolution 06/25/2015.  Report from [facility] Mild to moderate cylindrical bronchiectasis in lingula and bilateral lower lobes.  Patchy consolidation and groundglass in lingula and bilateral lower lobes. Fine groundglass centrilobular nodularity in the upper lungs  CT chest 02/26/2016 Mild subpleural reticular densities and bronchiectasis at the base.  Aortic and coronary atherosclerosis  High-resolution CT 08/25/2021 Reduce of groundglass attenuation and septal thickening and bronchovascular interstitial lung with cylindrical bronchiectasis.  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  High-resolution CT 02/19/2022.  Redemonstration of groundglass attenuation, septal thickening and bronchiectasis I have reviewed the images personally.  PFTs: Spirometry 10/26/2020 FVC 1.75 [75%], FEV1 1.57 [91%], F/F 19 No obstruction, restriction possible  10/08/2021 FVC 1.68 [72%], FEV1 1.18 [68%], F/F 70, TLC 2.92 [65%], DLCO 9.47 [57%] Moderate restriction and diffusion defect  Labs: CTD serologies 09/28/2021-ANA 1: 40 nucleolar homogeneous, SSA 28  Assessment:  Evaluation for interstitial lung disease She has history of bronchiectasis was presumed secondary to chronic immunodeficiency Further review of CT scan shows some fibrotic changes.  She did have mild reticulation at the base with progression since 2017.  This raises the question of CTD ILD or UIP pulmonary fibrosis  Noted to have elevation in ANA and SSA. She does have dry mouth and dry ice raising the possibility of Sjogren's syndrome.  Rheumatology evaluation is pending  We reviewed her case at multidisciplinary conference on June 2023.  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  Over the income cutoff for Ofev assistance.  Will try to see if she will qualify  for Esbriet or generic pirfenidone  Severe persistent asthma On Breo, ProAir. Arnuity add-on started by Dr. Ernst Bowler  GERD Continue omeprazole  Chronic rhinitis Continue Flonase  Plan/Recommendations: Start paperwork for prifenidone Mucociliary clearance with Acapella Continue inhalers for asthma  Marshell Garfinkel MD [facility] 08/15/2022, 10:18 AM  CC: [name], MD

## 2022-08-15 NOTE — Progress Notes (Signed)
Will start pirfenidone BIV  Knox Saliva, PharmD, MPH, BCPS, CPP Clinical Pharmacist (Rheumatology and Pulmonology)

## 2022-08-23 ENCOUNTER — Other Ambulatory Visit (HOSPITAL_COMMUNITY): Payer: Self-pay

## 2022-08-23 NOTE — Telephone Encounter (Signed)
Received notification from [facility] regarding a prior authorization for PIRFENIDONE. Authorization has been APPROVED from 08/15/2022 to 07/22/2023. Approval letter sent to scan center.  Per test claim, copay for 30 days supply is $155.99 (for 6 tabs per day)  Patient can fill through [employer]: [phone]   Authorization # [id number] Phone # [phone]  When we had run Ofev BIV last year, patient was sure she did not meet income criteria.   Knox Saliva, PharmD, MPH, BCPS, CPP Clinical Pharmacist (Rheumatology and Pulmonology)

## 2022-08-26 ENCOUNTER — Telehealth: Payer: Self-pay | Admitting: Pharmacist

## 2022-08-26 ENCOUNTER — Other Ambulatory Visit (HOSPITAL_COMMUNITY): Payer: Self-pay

## 2022-08-26 DIAGNOSIS — Z5181 Encounter for therapeutic drug level monitoring: Secondary | ICD-10-CM

## 2022-08-26 DIAGNOSIS — Z7189 Other specified counseling: Secondary | ICD-10-CM

## 2022-08-26 DIAGNOSIS — J849 Interstitial pulmonary disease, unspecified: Secondary | ICD-10-CM

## 2022-08-26 MED ORDER — PIRFENIDONE 267 MG PO TABS
ORAL_TABLET | ORAL | 0 refills | Status: DC
  Filled 2022-08-26: qty 207, fill #0
  Filled 2022-08-29: qty 126, 21d supply, fill #0

## 2022-08-26 MED ORDER — PIRFENIDONE 801 MG PO TABS
801.0000 mg | ORAL_TABLET | Freq: Three times a day (TID) | ORAL | 2 refills | Status: DC
  Filled 2022-08-26 – 2022-09-12 (×2): qty 90, 30d supply, fill #0

## 2022-08-26 NOTE — Telephone Encounter (Signed)
Subjective:  Patient called today by [facility] Pulmonary pharmacy team for pirfenidone new start.  Patient was last seen by Dr. Vaughan Browner on 08/15/2022.  Pertinent past medical history includes ILD, aortic atherosclerosis, HTN, severe persistent asthma, allergic rhinitis, age-related osteoporosis, insomnia.  She was intended to start Ofev last year but did not start due to not meeting financial criteria for patient assistance.  History of diarrhea, nausea, vomiting: No  Objective: Allergies  Allergen Reactions   Fish Allergy Anaphylaxis   Guaifenesin Other (See Comments)    Other reaction(s): DIZZINESS  Mucinex D   Guaifenesin Er     Other reaction(s): DIZZINESS  Mucinex D Other reaction(s): DIZZINESS  Mucinex D   Levofloxacin Other (See Comments)    Double vision Double vision Double vision Double vision   Codeine Nausea Only    Can tolerate if she eats prior to taking medication. Can tolerate if she eats prior to taking medication.   Elemental Sulfur Nausea Only   Erythromycin Nausea Only    Patient reports she CAN TAKE Zithromax without problem Patient reports she CAN TAKE Zithromax without problem   Sulfa Antibiotics Nausea Only    Outpatient Encounter Medications as of 08/26/2022  Medication Sig   albuterol (PROAIR HFA) 108 (90 Base) MCG/ACT inhaler Inhale 2 puffs into the lungs every 4 (four) hours as needed.   albuterol (PROVENTIL) (2.5 MG/3ML) 0.083% nebulizer solution    Artificial Tear Solution (SYSTANE CONTACTS OP) Apply to eye.   aspirin 81 MG EC tablet Take 81 mg by mouth daily.   atorvastatin (LIPITOR) 20 MG tablet Take 20 mg by mouth.   BREO ELLIPTA 200-25 MCG/ACT AEPB INHALE 1 PUFF INTO THE LUNGS DAILY   budesonide (PULMICORT) 0.5 MG/2ML nebulizer solution Use one vial mixed with an albuterol nebulizer vial every 6 hours during periods of respiratory distress for 1-2 weeks at a time.   Calcium Carbonate-Vitamin D 600-200 MG-UNIT TABS Take 2,000 tablets by  mouth.    EPIPEN 2-PAK 0.3 MG/0.3ML SOAJ injection Inject 0.3 mLs (0.3 mg total) into the muscle as needed for anaphylaxis.   Esomeprazole Magnesium (NEXIUM 24HR PO) Take by mouth.   famotidine (PEPCID) 20 MG tablet Take 20 mg by mouth 2 (two) times daily.   fexofenadine (ALLEGRA) 180 MG tablet Take by mouth as needed.   fluticasone (FLONASE) 50 MCG/ACT nasal spray Administer 1 spray in each nostril daily.   ibuprofen (ADVIL) 200 MG tablet Take by mouth.   ibuprofen (ADVIL,MOTRIN) 200 MG tablet Take 200 mg by mouth as needed.   Immune Globulin-Hyaluronidase (HYQVIA) 30 GM/300ML KIT Inject 38 g into the skin every 21 ( twenty-one) days.   losartan (COZAAR) 25 MG tablet Take 50 mg by mouth daily.   metoprolol succinate (TOPROL-XL) 25 MG 24 hr tablet Take 25 mg by mouth daily.   montelukast (SINGULAIR) 10 MG tablet    Multiple Vitamin (MULTIVITAMIN) tablet Take by mouth.   polyethylene glycol powder (GLYCOLAX/MIRALAX) 17 GM/SCOOP powder Take by mouth.   Romosozumab-aqqg (EVENITY [place]) Inject into the skin. 1 x per month   No facility-administered encounter medications on file as of 08/26/2022.     Immunization History  Administered Date(s) Administered   Influenza,inj,Quad PF,6+ Mos 08/06/2021   PFIZER(Purple Top)SARS-COV-2 Vaccination 08/27/2019, 09/17/2019, 04/27/2020, 01/21/2021   Pneumococcal Conjugate-13 06/20/2014   Pneumococcal Polysaccharide-23 08/08/2011   Pneumococcal-Unspecified 08/08/2011    PFT's TLC  Date Value Ref Range Status  10/08/2021 2.92 L Final    CMP     Component  Value Date/Time   NA 142 06/18/2022 1249   K 4.4 06/18/2022 1249   CL 103 06/18/2022 1249   CO2 22 06/18/2022 1249   GLUCOSE 104 (H) 06/18/2022 1249   GLUCOSE 104 (H) 11/18/2016 1424   BUN 12 06/18/2022 1249   CREATININE 0.94 06/18/2022 1249   CREATININE 0.83 11/18/2016 1424   CALCIUM 9.0 06/18/2022 1249   PROT 7.0 06/18/2022 1249   ALBUMIN 4.1 06/18/2022 1249   AST 27 06/18/2022 1249   ALT 19  06/18/2022 1249   ALKPHOS 141 (H) 06/18/2022 1249   BILITOT <0.2 06/18/2022 1249   GFRNONAA 72 02/22/2020 1352   GFRNONAA 72 11/18/2016 1424   GFRAA 83 02/22/2020 1352   GFRAA 83 11/18/2016 1424      CBC    Component Value Date/Time   WBC 5.3 06/18/2022 1249   WBC 6.4 11/18/2016 1424   RBC 3.91 06/18/2022 1249   RBC 3.84 11/18/2016 1424   HGB 13.1 06/18/2022 1249   HCT 38.1 06/18/2022 1249   PLT 263 07/27/2021 1129   MCV 97 06/18/2022 1249   MCH 33.5 (H) 06/18/2022 1249   MCH 32.3 11/18/2016 1424   MCHC 34.4 06/18/2022 1249   MCHC 33.0 11/18/2016 1424   RDW 12.6 06/18/2022 1249   LYMPHSABS 1.0 06/18/2022 1249   MONOABS 512 11/18/2016 1424   EOSABS 0.1 06/18/2022 1249   BASOSABS 0.1 06/18/2022 1249      LFT's    Latest Ref Rng & Units 06/18/2022   12:49 PM 02/07/2022   10:47 AM 07/27/2021   11:29 AM  Hepatic Function  Total Protein 6.0 - 8.5 g/dL 7.0  7.6  6.8   Albumin 3.8 - 4.8 g/dL 4.1  4.2  3.7   AST 0 - 40 IU/L 27  25  25    ALT 0 - 32 IU/L 19  19  18    Alk Phosphatase 44 - 121 IU/L 141  121  64   Total Bilirubin 0.0 - 1.2 mg/dL <0.2  0.3  0.2     HRCT (02/19/2022) - Mild basilar bronchiectasis, traction bronchiolectasis, ground-glass and scattered subpleural reticular densities in the lower lobes. Comparison with 08/25/2021 is challenging due to respiratory motion. There has been progression from 02/26/2016. Findings may be due to fibrotic nonspecific interstitial pneumonitis. Fibrotic hypersensitivity pneumonitis is considered less likely in the absence of air trapping. Findings are indeterminate for UIP per consensus guidelines  Assessment and Plan  Esbriet Medication Management Thoroughly counseled patient on the efficacy, mechanism of action, dosing, administration, adverse effects, and monitoring parameters of Esbriet.  Patient verbalized understanding.   Goals of Therapy: Will not stop or reverse the progression of ILD. It will slow the progression of ILD.    Dosing: Starting dose will be Esbriet 267 mg 1 tablet three times daily for 7 days, then 2 tablets three times daily for 7 days, then 3 tablets three times daily.  Maintenance dose will be 801 mg 1 tablet three times daily if tolerated.  Stressed the importance of taking with meals and space at least 5-6 hours apart to minimize stomach upset.   Adverse Effects: Nausea, vomiting, diarrhea, weight loss Abdominal pain GERD Sun sensitivity/rash - patient advised to wear sunscreen when exposed to sunlight Dizziness Fatigue  Monitoring: Monitor for diarrhea, nausea and vomiting, GI perforation, hepatotoxicity  Monitor LFTs - baseline, monthly for first 6 months, then every 3 months routinely  Access: Approval of Esbriet through: insurance Rx sent to: [facility]:  [phone]  Patient advised that [employer] will call later in week to schedule shipment to her home and collect payment information  Medication Reconciliation A drug regimen assessment was performed, including review of allergies, interactions, disease-state management, dosing and immunization history. Medications were reviewed with the patient, including name, instructions, indication, goals of therapy, potential side effects, importance of adherence, and safe use.  Thank you for involving pharmacy to assist in providing this patient's care.   Knox Saliva, PharmD, MPH, BCPS, CPP Clinical Pharmacist (Rheumatology and Pulmonology)

## 2022-08-26 NOTE — Telephone Encounter (Signed)
Spoke with patient regarding pirfenidone approval. She is comfortable with getting starting on the medication. She has been advised that [employer] will reach out later this week to set up shipment and collect copay  Knox Saliva, PharmD, MPH, BCPS, CPP Clinical Pharmacist (Rheumatology and Pulmonology)

## 2022-08-27 ENCOUNTER — Other Ambulatory Visit (HOSPITAL_COMMUNITY): Payer: Self-pay

## 2022-08-28 ENCOUNTER — Other Ambulatory Visit (HOSPITAL_COMMUNITY): Payer: Self-pay

## 2022-08-29 ENCOUNTER — Other Ambulatory Visit: Payer: Self-pay

## 2022-08-29 ENCOUNTER — Other Ambulatory Visit (HOSPITAL_COMMUNITY): Payer: Self-pay

## 2022-08-29 NOTE — Telephone Encounter (Signed)
Delivery instructions have been updated in [facility], medication will be shipped to patient's home address by 09/02/2022.  Rx has been processed in [facility] and there is a copay of $114.53. Payment information has been collected and forwarded to the pharmacy.

## 2022-09-12 ENCOUNTER — Other Ambulatory Visit: Payer: Self-pay

## 2022-09-12 ENCOUNTER — Other Ambulatory Visit (HOSPITAL_COMMUNITY): Payer: Self-pay

## 2022-09-16 ENCOUNTER — Ambulatory Visit (INDEPENDENT_AMBULATORY_CARE_PROVIDER_SITE_OTHER): Payer: Medicare Other | Admitting: Pulmonary Disease

## 2022-09-16 ENCOUNTER — Encounter: Payer: Self-pay | Admitting: Pulmonary Disease

## 2022-09-16 VITALS — BP 142/80 | HR 80 | Temp 97.7°F | Ht 60.0 in | Wt 130.2 lb

## 2022-09-16 DIAGNOSIS — Z5181 Encounter for therapeutic drug level monitoring: Secondary | ICD-10-CM | POA: Diagnosis not present

## 2022-09-16 DIAGNOSIS — J849 Interstitial pulmonary disease, unspecified: Secondary | ICD-10-CM

## 2022-09-16 LAB — COMPREHENSIVE METABOLIC PANEL
ALT: 24 U/L (ref 0–35)
AST: 32 U/L (ref 0–37)
Albumin: 3.8 g/dL (ref 3.5–5.2)
Alkaline Phosphatase: 100 U/L (ref 39–117)
BUN: 11 mg/dL (ref 6–23)
CO2: 28 mEq/L (ref 19–32)
Calcium: 10 mg/dL (ref 8.4–10.5)
Chloride: 102 mEq/L (ref 96–112)
Creatinine, Ser: 0.9 mg/dL (ref 0.40–1.20)
GFR: 62.18 mL/min (ref >60.00)
Glucose, Bld: 85 mg/dL (ref 70–99)
Potassium: 3.9 mEq/L (ref 3.5–5.1)
Sodium: 138 mEq/L (ref 135–145)
Total Bilirubin: 0.3 mg/dL (ref 0.2–1.2)
Total Protein: 7.8 g/dL (ref 6.0–8.3)

## 2022-09-16 NOTE — Patient Instructions (Signed)
I am sorry you are not tolerating the Esbriet.  He can go back to 1 pill 3 times a day for a week or 2 and then slowly uptitrate again Will check a comprehensive metabolic panel today and monthly to monitor blood counts Follow-up in 1 to 2 months.

## 2022-09-16 NOTE — Progress Notes (Signed)
Janet Garcia    [id number]    12-21-1945  Primary Care Physician:Jobe, Alexis Goodell., MD  Referring Physician: [name], MD [address] [place] [address] [place],  [place] [address]  Chief complaint: Follow-up for Interstitial lung disease, asthma  HPI: 77 y.o.  with history of severe persistent asthma, common variable immunodeficiency, GERD She follows with Dr. Ernst Bowler and is on immunoglobulin subcutaneous injections.  She has history of bronchiectasis previously followed by Dr. Camillo Flaming.  She has been referred for evaluation of bronchiectasis Complains of occasional productive cough, dyspnea with wheezing.  She has indigestion from GERD.  Saw Dr. Estanislado Pandy in early January 2024 for elevated SSA.  It was felt that she did not have any autoimmune process She is over the income cutoff for assistance with Ofev  Pets: Cats Occupation: A board member of nonprofit organization Exposures: No mold, hot tub, Jacuzzi.  No feather pillows or comforters Smoking history: Never smoker Travel history: Previously lived in [place] Relevant family history: No family history of lung disease  Interim history: States that breathing is stable She started pirfenidone in early February 2024.  She is now up to 2 pills 3 times a day and notes brain fog which is limiting her quality of life   Outpatient Encounter Medications as of 09/16/2022  Medication Sig   albuterol (PROAIR HFA) 108 (90 Base) MCG/ACT inhaler Inhale 2 puffs into the lungs every 4 (four) hours as needed.   albuterol (PROVENTIL) (2.5 MG/3ML) 0.083% nebulizer solution    Artificial Tear Solution (SYSTANE CONTACTS OP) Apply to eye.   aspirin 81 MG EC tablet Take 81 mg by mouth daily.   atorvastatin (LIPITOR) 20 MG tablet Take 20 mg by mouth.   BREO ELLIPTA 200-25 MCG/ACT AEPB INHALE 1 PUFF INTO THE LUNGS DAILY   budesonide (PULMICORT) 0.5 MG/2ML nebulizer solution Use one vial mixed with an albuterol nebulizer vial  every 6 hours during periods of respiratory distress for 1-2 weeks at a time.   Calcium Carbonate-Vitamin D 600-200 MG-UNIT TABS Take 2,000 tablets by mouth.    EPIPEN 2-PAK 0.3 MG/0.3ML SOAJ injection Inject 0.3 mLs (0.3 mg total) into the muscle as needed for anaphylaxis.   Esomeprazole Magnesium (NEXIUM 24HR PO) Take by mouth.   famotidine (PEPCID) 20 MG tablet Take 20 mg by mouth 2 (two) times daily.   fexofenadine (ALLEGRA) 180 MG tablet Take by mouth as needed.   fluticasone (FLONASE) 50 MCG/ACT nasal spray Administer 1 spray in each nostril daily.   ibuprofen (ADVIL) 200 MG tablet Take by mouth.   ibuprofen (ADVIL,MOTRIN) 200 MG tablet Take 200 mg by mouth as needed.   Immune Globulin-Hyaluronidase (HYQVIA) 30 GM/300ML KIT Inject 38 g into the skin every 21 ( twenty-one) days.   losartan (COZAAR) 25 MG tablet Take 50 mg by mouth daily.   metoprolol succinate (TOPROL-XL) 25 MG 24 hr tablet Take 25 mg by mouth daily.   montelukast (SINGULAIR) 10 MG tablet    Multiple Vitamin (MULTIVITAMIN) tablet Take by mouth.   Pirfenidone 267 MG TABS Take 1 tab three times daily for 7 days, then 2 tabs three times daily for 7 days, then 3 tabs three times daily thereafter.   [START ON 09/24/2022] Pirfenidone 801 MG TABS Take 1 tablet (801 mg total) by mouth 3 (three) times daily with meals.   polyethylene glycol powder (GLYCOLAX/MIRALAX) 17 GM/SCOOP powder Take by mouth.   Romosozumab-aqqg (EVENITY [place]) Inject into the skin.  1 x per month   No facility-administered encounter medications on file as of 09/16/2022.    Physical Exam: Blood pressure (!) 142/80, pulse 80, temperature 97.7 F (36.5 C), temperature source Oral, height 5' (1.524 m), weight 130 lb 3.2 oz (59.1 kg), SpO2 97 %. Gen:      No acute distress HEENT:  EOMI, sclera anicteric Neck:     No masses; no thyromegaly Lungs:    Clear to auscultation bilaterally; normal respiratory effort CV:         Regular rate and rhythm; no murmurs Abd:       + bowel sounds; soft, non-tender; no palpable masses, no distension Ext:    No edema; adequate peripheral perfusion Skin:      Warm and dry; no rash Neuro: alert and oriented x 3 Psych: normal mood and affect   Data Reviewed: Imaging: CT high-resolution 06/25/2015.  Report from [facility] Mild to moderate cylindrical bronchiectasis in lingula and bilateral lower lobes.  Patchy consolidation and groundglass in lingula and bilateral lower lobes. Fine groundglass centrilobular nodularity in the upper lungs  CT chest 02/26/2016 Mild subpleural reticular densities and bronchiectasis at the base.  Aortic and coronary atherosclerosis  High-resolution CT 08/25/2021 Reduce of groundglass attenuation and septal thickening and bronchovascular interstitial lung with cylindrical bronchiectasis.  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  High-resolution CT 02/19/2022.  Redemonstration of groundglass attenuation, septal thickening and bronchiectasis I have reviewed the images personally.  PFTs: Spirometry 10/26/2020 FVC 1.75 [75%], FEV1 1.57 [91%], F/F 19 No obstruction, restriction possible  10/08/2021 FVC 1.68 [72%], FEV1 1.18 [68%], F/F 70, TLC 2.92 [65%], DLCO 9.47 [57%] Moderate restriction and diffusion defect  Labs: CTD serologies 09/28/2021-ANA 1: 40 nucleolar homogeneous, SSA 28  Assessment:  Evaluation for interstitial lung disease She has history of bronchiectasis was presumed secondary to chronic immunodeficiency Further review of CT scan shows some fibrotic changes.  She did have mild reticulation at the base with progression since 2017.  This raises the question of CTD ILD or UIP pulmonary fibrosis  Noted to have elevation in ANA and SSA. She does have dry mouth and dry ice raising the possibility of Sjogren's syndrome.  Rheumatology evaluation is pending  We reviewed her case at multidisciplinary conference on June 2023.  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  Over the income cutoff for Ofev assistance.  Currently on pirfenidone.  I have asked her to reduce dose to 1 pill 3 times a day temporarily and then resume a slow titration upwards as she is having symptoms of brain fog Check monthly hepatic panel.  Severe persistent asthma On Breo, ProAir. Arnuity add-on started by Dr. Ernst Bowler  GERD Continue omeprazole  Chronic rhinitis Continue Flonase  Plan/Recommendations: Continue pirfenidone with slow titration Mucociliary clearance with Acapella Continue inhalers for asthma  Marshell Garfinkel MD [facility] 09/16/2022, 10:52 AM  CC: [name], MD

## 2022-09-17 ENCOUNTER — Other Ambulatory Visit (HOSPITAL_COMMUNITY): Payer: Self-pay

## 2022-09-18 ENCOUNTER — Other Ambulatory Visit: Payer: Self-pay

## 2022-09-18 ENCOUNTER — Other Ambulatory Visit (HOSPITAL_COMMUNITY): Payer: Self-pay

## 2022-09-19 ENCOUNTER — Ambulatory Visit (INDEPENDENT_AMBULATORY_CARE_PROVIDER_SITE_OTHER): Payer: Medicare Other | Admitting: Allergy & Immunology

## 2022-09-19 ENCOUNTER — Encounter: Payer: Self-pay | Admitting: Allergy & Immunology

## 2022-09-19 ENCOUNTER — Other Ambulatory Visit: Payer: Self-pay

## 2022-09-19 VITALS — BP 142/70 | HR 80 | Temp 98.1°F | Resp 18

## 2022-09-19 DIAGNOSIS — J310 Chronic rhinitis: Secondary | ICD-10-CM | POA: Diagnosis not present

## 2022-09-19 DIAGNOSIS — D638 Anemia in other chronic diseases classified elsewhere: Secondary | ICD-10-CM

## 2022-09-19 DIAGNOSIS — E538 Deficiency of other specified B group vitamins: Secondary | ICD-10-CM | POA: Diagnosis not present

## 2022-09-19 DIAGNOSIS — D839 Common variable immunodeficiency, unspecified: Secondary | ICD-10-CM | POA: Diagnosis not present

## 2022-09-19 DIAGNOSIS — J479 Bronchiectasis, uncomplicated: Secondary | ICD-10-CM

## 2022-09-19 DIAGNOSIS — R5383 Other fatigue: Secondary | ICD-10-CM

## 2022-09-19 DIAGNOSIS — J4550 Severe persistent asthma, uncomplicated: Secondary | ICD-10-CM | POA: Diagnosis not present

## 2022-09-19 MED ORDER — ESOMEPRAZOLE MAGNESIUM 20 MG PO CPDR: 20.0000 mg | DELAYED_RELEASE_CAPSULE | Freq: Every morning | ORAL | 5 refills | Status: DC

## 2022-09-19 MED ORDER — ARNUITY ELLIPTA 200 MCG/ACT IN AEPB: INHALATION_SPRAY | RESPIRATORY_TRACT | 5 refills | Status: DC

## 2022-09-19 MED ORDER — ALBUTEROL SULFATE HFA 108 (90 BASE) MCG/ACT IN AERS: 2.0000 | INHALATION_SPRAY | RESPIRATORY_TRACT | 2 refills | Status: DC | PRN

## 2022-09-19 MED ORDER — BREO ELLIPTA 200-25 MCG/ACT IN AEPB: 1.0000 | INHALATION_SPRAY | Freq: Every day | RESPIRATORY_TRACT | 5 refills | Status: DC

## 2022-09-19 MED ORDER — FAMOTIDINE 20 MG PO TABS: 20.0000 mg | ORAL_TABLET | Freq: Two times a day (BID) | ORAL | 5 refills | Status: DC

## 2022-09-19 MED ORDER — FLUTICASONE PROPIONATE 50 MCG/ACT NA SUSP: 1.0000 | Freq: Every day | NASAL | 5 refills | Status: DC

## 2022-09-19 NOTE — Patient Instructions (Addendum)
1. Common variable immunodeficiency - We will continue with the current dosing of Hyqvia (38 grams every 3 weeks).  - We will get some routine labs toady. - Continue with the premedication regimen.   2. Severe persistent asthma, uncomplicated - Lung testing looked amazing today and even better than last time.  - I will send my note to Dr. Vaughan Browner. - Daily controller medication(s): Breo 200/25 mcg one puff once daily - Prior to physical activity: ProAir 2 puffs 10-15 minutes before physical activity. - Rescue medications: ProAir 4 puffs every 4-6 hours as needed or albuterol nebulizer one vial every 4-6 hours as needed - Changes during respiratory infections or worsening symptoms: Add on Arnuity 250mg to  one inhalation  twice daily for TWO WEEKS. - You can also add on albuterol/Pulmicort neb BEFORE the Acapella device to open things up for a few weeks during flares.  - Asthma control goals:  * Full participation in all desired activities (may need albuterol before activity) * Albuterol use two time or less a week on average (not counting use with activity) * Cough interfering with sleep two time or less a month * Oral steroids no more than once a year * No hospitalizations  3. Chronic rhinitis - Continue with Flonase one spray per nostril daily. - Consider using nasal saline rinses prior to using the Flonase.  - I think Allegra every other day should be fine.   4. Gastroesophageal reflux disease - Continue with the Nexium in the morning. - Continue with famotidine twice daily.   5. Return in about 4 months (around 01/18/2023).    Please inform [place] any Emergency Department visits, hospitalizations, or changes in symptoms. Call [place]efore going to the ED for breathing or allergy symptoms since we might be able to fit you in for a sick visit. Feel free to contact u[place]anytime with any questions, problems, or concerns.  It was a pleasure to see you again today! We LOVE seeing you!    Websites that have reliable patient information: 1. American Academy of Asthma, Allergy, and Immunology: www.aaaai.org 2. Food Allergy Research and Education (FARE): foodallergy.org 3. Mothers of Asthmatics: http://www.asthmacommunitynetwork.org 4. American College of Allergy, Asthma, and Immunology: www.acaai.org   COVID-19 Vaccine Information can be found at: [email or web address] questions related to vaccine distribution or appointments, please email [email or web address] or call [phone]   We realize that you might be concerned about having an allergic reaction to the COVID19 vaccines. To help with that concern, WE ARE OFFERING THE COVID19 VACCINES IN OUR OFFICE! Ask the front desk for dates!     "Like" u[place]on Facebook and Instagram for our latest updates!      A healthy democracy works best when ANew [place] Life Insuranceparticipate! Make sure you are registered to vote! If you have moved or changed any of your contact information, you will need to get this updated before voting!  In some cases, you MAY be able to register to vote online: [email or web address]

## 2022-09-19 NOTE — Progress Notes (Signed)
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 Camillo Flaming, now followed by Dr. Vaughan Browner with transition to more of a UIP picture and being treated with pirfenidone    Chronic rhinitis   Gastroesophageal reflux disease - on a PPI   Chronic back pain - s/p kyphoplasty October 2022   Fatigue - previously improved, but worsening as of late   Paternal history unknown (father might have died from emphysema, but largely unknown)  Plan/Recommendations:   1. Common variable immunodeficiency - We will continue with the current dosing of Hyqvia (38 grams every 3 weeks).  - We will get some routine labs toady. - Continue with the premedication regimen.   2. Severe persistent asthma, uncomplicated - Lung testing looked amazing today and even better than last time.  - I will send my note to Dr. Vaughan Browner. - Daily controller medication(s): Breo 200/25 mcg one puff once daily - Prior to physical activity: ProAir 2 puffs 10-15 minutes before physical activity. - Rescue medications: ProAir 4 puffs every 4-6 hours as needed or albuterol nebulizer one vial every 4-6 hours as needed - Changes during respiratory infections or worsening symptoms: Add on Arnuity 246mg to  one inhalation  twice daily for TWO WEEKS. - You can also add on albuterol/Pulmicort neb BEFORE the Acapella device to open things up for a few weeks during flares.  - Asthma control goals:  * Full participation in all desired activities (may need albuterol before activity) * Albuterol use two time or less a week on average (not counting use with activity) * Cough interfering with sleep two time or  less a month * Oral steroids no more than once a year * No hospitalizations  3. Chronic rhinitis - Continue with Flonase one spray per nostril daily. - Consider using nasal saline rinses prior to using the Flonase.  - I think Allegra every other day should be fine.   4. Gastroesophageal reflux disease - Continue with the Nexium in the morning. - Continue with famotidine twice daily.   5. Return in about 4 months (around 01/18/2023).   Subjective:   Janet Garcia a 77y.o. female presenting today for follow up of  Chief Complaint  Patient presents with   Follow-up    Janet Garcia a history of the following: Patient Active Problem List   Diagnosis Date Noted   Closed wedge compression fracture of T7 vertebra with delayed healing 05/04/2021   Closed wedge compression fracture of T9 vertebra with delayed healing 05/04/2021   Age-related osteoporosis with current pathological fracture with delayed healing 02/02/2021   History of vertebral compression fracture 06/29/2020   Symptomatic PVCs [id number]  Diastolic dysfunction [id number]  Osteoarthritis of left hip 03/05/2018   Aortic atherosclerosis ([place] 01/19/2018   B12 deficiency 05/10/2017   Insomnia 03/25/2017   Closed compression fracture of L3 vertebra ([place] 08/06/2016   Recurrent infections 02/24/2016   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[place] 02/24/2016   Severe persistent asthma 02/24/2016   Chronic rhinitis 02/24/2016   Anemia of chronic disease 02/24/2016   Opacity of lung on imaging study 02/24/2016   Cataract 10/11/2015   Moderate persistent asthma 09/15/2015  Esophageal reflux 09/15/2015   Essential hypertension 09/15/2015   Abnormal liver enzymes 07/26/2015   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   H/O total hip arthroplasty 07/26/2015   Lumbosacral spondylosis 07/26/2015   Asthma, mild persistent 07/05/2015   Abnormal CAT scan 06/30/2015   PNA (pneumonia) 06/21/2015   Anxiety  05/23/2015   HLD (hyperlipidemia) 03/24/2015   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   Family history of colonic polyps 01/13/2015   Recurrent disease 11/21/2014   Essential (primary) hypertension 08/15/2014   Anxiety, generalized 08/15/2014   Left lower lobe pneumonia 04/16/2014   Arthritis, degenerative 04/16/2014   Primary osteoarthritis involving multiple joints 04/16/2014   Chronic infection of sinus 03/15/2014   Bronchiectasis ([place]) 03/10/2014   H/O respiratory system disease 03/10/2014   Can't get food down 03/01/2014   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  Janet Garcia is a 77 y.o. female presenting for a follow up visit.  She was last seen in November 2023.  At that time, we continued with HyQvia 38 g every 3 weeks.  For her lungs, lung testing looked amazing.  We continue with Breo 200 mcg 1 puff once daily as well as albuterol as needed.  For her rhinitis, she continue with Flonase as well as Allegra.  GERD was controlled with omeprazole.  Since last visit, she has done very well.  Asthma/Respiratory Symptom History: She remains on the Breo 1 puff once daily.  This seems to be controlling her symptoms very effectively.  She has not been using her rescue inhaler much at all.  She has not been on prednisone.  She has the Pulmicort and the Arnuity to add during flares, but she does not use this on a routine basis.  She has not had a really bad asthma exacerbation for probably about a year now.  She  last saw Dr. Vaughan Browner earlier this week. She is now on pirfenidone one pill TID. She had increased to two TID, but she was "catatonic" and unable to drive. She decreased it to one tab TID and this improved. She is going to increase again when she is "comfortable" doing so. It has a lot of stomach pain and nausea with it. This is more affordable than the ofev. She is working hard on decreasing the scarring. She is on monthly liver function testing to monitor this. These most recent labs were normal.   Allergic Rhinitis Symptom History: She remains on Flonase 1 spray per nostril daily.  She also is on Allegra which she uses every other day.  She does use nasal saline rinses.  She has not been on antibiotics in quite some time.  Infusions are going well.  She is taking 3 hours per infusion. She does her at night. She does have some bleeding occasionally as well. She is fairly normal by the next morning. She doers have a knot on her left arm. Apparently they are trying to change her pump around. She prefers to battery operated device.  They are going to be going on a [place] cruise through [place].  She might have to arrange for  her to take some extra immunoglobulin with her.  Skin Symptom History: She remains on Nexium once daily as well as famotidine twice daily.  This seems to be doing the trick to control her reflux.  She sees a gastroenterologist.  Her husband was sick with some thing and she did not contract it. She has been living on Lysol. But she did not catch what her husband had. She does report that he has been having some memory loss. He is nearly 77 years old, however.   Otherwise, there have been no changes to her past medical history, surgical history, family history, or social history.    Review of Systems  Constitutional: Negative.  Negative for chills, fever, malaise/fatigue and weight loss.  HENT: Negative.  Negative for congestion, ear discharge, ear pain and sinus pain.   Eyes:   Negative for pain, discharge and redness.  Respiratory:  Negative for cough, sputum production, shortness of breath and wheezing.   Cardiovascular: Negative.  Negative for chest pain and palpitations.  Gastrointestinal:  Negative for abdominal pain, constipation, diarrhea, heartburn, nausea and vomiting.  Skin: Negative.  Negative for itching and rash.  Neurological:  Negative for dizziness and headaches.  Endo/Heme/Allergies:  Positive for environmental allergies. Does not bruise/bleed easily.       Objective:   Blood pressure (!) 142/70, pulse 80, temperature 98.1 F (36.7 C), temperature source Temporal, resp. rate 18, SpO2 97 %. There is no height or weight on file to calculate BMI.    Physical Exam Vitals reviewed.  Constitutional:      Appearance: She is well-developed.     Comments: Delightful as always. Cooperative with the exam. Hoarseness present.  HENT:     Head: Normocephalic and atraumatic.     Right Ear: Tympanic membrane, ear canal and external ear normal.     Left Ear: Tympanic membrane, ear canal and external ear normal.     Nose: No nasal deformity, septal deviation, mucosal edema or rhinorrhea.     Right Turbinates: Enlarged, swollen and pale.     Left Turbinates: Enlarged, swollen and pale.     Right Sinus: No maxillary sinus tenderness or frontal sinus tenderness.     Left Sinus: No maxillary sinus tenderness or frontal sinus tenderness.     Comments: No nasal polyps noted. Turbinates are erythematous.    Mouth/Throat:     Lips: Pink.     Mouth: Mucous membranes are moist. Mucous membranes are not pale and not dry.     Pharynx: Oropharynx is clear. Uvula midline.     Tonsils: No tonsillar exudate. 1+ on the right. 1+ on the left.     Comments: Cobblestoning present in the posterior oropharynx.  Eyes:     General: Allergic shiner present.        Right eye: No discharge.        Left eye: No discharge.     Conjunctiva/sclera: Conjunctivae normal.      Right eye: Right conjunctiva is not injected. No chemosis.    Left eye: Left conjunctiva is not injected. No chemosis.    Pupils: Pupils are equal, round, and reactive to light.  Cardiovascular:     Rate and Rhythm: Normal rate and regular rhythm.     Heart sounds: Normal heart sounds.  Pulmonary:     Effort: Pulmonary effort is normal. No tachypnea, accessory muscle usage or respiratory distress.     Breath sounds: Examination of the right-lower field reveals decreased breath sounds. Examination of  the left-lower field reveals decreased breath sounds. Decreased breath sounds present. No wheezing, rhonchi or rales.     Comments: Moving air well in all lung fields. She does have her baseline hoarseness. There are no crackles today.  Chest:     Chest wall: No tenderness.  Lymphadenopathy:     Cervical: No cervical adenopathy.  Skin:    Coloration: Skin is not pale.     Findings: No abrasion, erythema, petechiae or rash. Rash is not papular, urticarial or vesicular.  Neurological:     Mental Status: She is alert.  Psychiatric:        Behavior: Behavior is cooperative.      Diagnostic studies:    Spirometry: results normal (FEV1: 1.43/81%, FVC: 2.18/95%, FEV1/FVC: 66%).    Spirometry consistent with normal pattern.  This is the best I have seen her breathing in quite some time.  Allergy Studies: none       Salvatore Marvel, MD  [facility] [place]      /

## 2022-09-24 ENCOUNTER — Other Ambulatory Visit (HOSPITAL_COMMUNITY): Payer: Self-pay

## 2022-09-25 ENCOUNTER — Other Ambulatory Visit (HOSPITAL_COMMUNITY): Payer: Self-pay

## 2022-09-27 ENCOUNTER — Telehealth: Payer: Self-pay

## 2022-09-27 NOTE — Telephone Encounter (Signed)
Let's do the Armonair Digihaler 113 mcg one puff BID.

## 2022-09-27 NOTE — Telephone Encounter (Signed)
PA request received via CMM for Arnuity Ellipta 200MCG/ACT aerosol powder  PA submitted to [employer] Medicare Part D and has been DENIED due to:   Arnuity Ellipta is denied because it is not on your plan's Drug List (formulary). Medication authorization requires the following: (1) You need to try two (2) of these covered drugs: (a) Sales executive. (b) Pulmicort Flexhaler. (2) OR your doctor needs to give [place] specific medical reasons why two (2) of the covered drug(s) are not appropriate for you.  Key: [id number]

## 2022-09-27 NOTE — Addendum Note (Signed)
Addended by: Norville Haggard on: 09/27/2022 02:13 PM   Modules accepted: Orders

## 2022-09-27 NOTE — Telephone Encounter (Signed)
I tried calling patient. I left a vm to callback to confirm pharmacy to send armonair.

## 2022-09-27 NOTE — Telephone Encounter (Signed)
Forwarding message to provider for next step. 

## 2022-09-30 MED ORDER — ARMONAIR DIGIHALER 113 MCG/ACT IN AEPB: 1.0000 | INHALATION_SPRAY | Freq: Two times a day (BID) | RESPIRATORY_TRACT | 5 refills | Status: DC

## 2022-09-30 NOTE — Telephone Encounter (Signed)
Janet Garcia called back and states she would like to confirm her pharmacy being [facility] at [address] In [place].

## 2022-09-30 NOTE — Addendum Note (Signed)
Addended by: Norville Haggard on: 09/30/2022 03:54 PM   Modules accepted: Orders

## 2022-09-30 NOTE — Telephone Encounter (Signed)
I tried reaching patient once again to verify pharmacy. I left a vm to call the office back.

## 2022-09-30 NOTE — Telephone Encounter (Signed)
Armonair has been sent to the [facility] at [address] in [place].

## 2022-10-15 ENCOUNTER — Other Ambulatory Visit (HOSPITAL_COMMUNITY): Payer: Self-pay

## 2022-10-25 ENCOUNTER — Telehealth: Payer: Self-pay | Admitting: Pulmonary Disease

## 2022-10-25 NOTE — Telephone Encounter (Signed)
PT IS SUPPOSED TO HAVE A LIVER BLOOD TEST DUE TO MEDS SHE'S CURRENTLY TAKING, PLS ADVISE WHERE SHE CAN GET IT DONE

## 2022-10-25 NOTE — Telephone Encounter (Signed)
Spoke with patient advised she can complete blood work at apt Monday. NFN

## 2022-10-28 ENCOUNTER — Ambulatory Visit (INDEPENDENT_AMBULATORY_CARE_PROVIDER_SITE_OTHER): Payer: Medicare Other | Admitting: Pulmonary Disease

## 2022-10-28 ENCOUNTER — Encounter: Payer: Self-pay | Admitting: Pulmonary Disease

## 2022-10-28 VITALS — BP 132/68 | HR 87 | Temp 98.0°F | Ht 60.0 in | Wt 130.0 lb

## 2022-10-28 DIAGNOSIS — J849 Interstitial pulmonary disease, unspecified: Secondary | ICD-10-CM

## 2022-10-28 DIAGNOSIS — Z5181 Encounter for therapeutic drug level monitoring: Secondary | ICD-10-CM | POA: Diagnosis not present

## 2022-10-28 LAB — COMPREHENSIVE METABOLIC PANEL
ALT: 19 U/L (ref 0–35)
AST: 28 U/L (ref 0–37)
Albumin: 4 g/dL (ref 3.5–5.2)
Alkaline Phosphatase: 91 U/L (ref 39–117)
BUN: 9 mg/dL (ref 6–23)
CO2: 27 mEq/L (ref 19–32)
Calcium: 9 mg/dL (ref 8.4–10.5)
Chloride: 105 mEq/L (ref 96–112)
Creatinine, Ser: 0.83 mg/dL (ref 0.40–1.20)
GFR: 68.47 mL/min (ref >60.00)
Glucose, Bld: 98 mg/dL (ref 70–99)
Potassium: 3.4 mEq/L — ABNORMAL LOW (ref 3.5–5.1)
Sodium: 140 mEq/L (ref 135–145)
Total Bilirubin: 0.3 mg/dL (ref 0.2–1.2)
Total Protein: 7.6 g/dL (ref 6.0–8.3)

## 2022-10-28 LAB — HEPATIC FUNCTION PANEL
ALT: 19 U/L (ref 0–35)
AST: 28 U/L (ref 0–37)
Albumin: 4 g/dL (ref 3.5–5.2)
Alkaline Phosphatase: 91 U/L (ref 39–117)
Bilirubin, Direct: 0.1 mg/dL (ref 0.0–0.3)
Total Bilirubin: 0.3 mg/dL (ref 0.2–1.2)
Total Protein: 7.6 g/dL (ref 6.0–8.3)

## 2022-10-28 NOTE — Progress Notes (Unsigned)
Janet Garcia    [id number]    August 31, 1945  Primary Care Physician:Jobe, Garrison [place]., MD  Referring Physician: [name], MD [address] [place],  [place] [address]  Chief complaint: Follow-up for Interstitial lung disease, asthma  HPI: 77 y.o.  with history of severe persistent asthma, common variable immunodeficiency, GERD She follows with Dr. Dellis Anes and is on immunoglobulin subcutaneous injections.  She has history of bronchiectasis previously followed by Dr. Su Monks.  She has been referred for evaluation of bronchiectasis Complains of occasional productive cough, dyspnea with wheezing.  She has indigestion from GERD.  Saw Dr. Corliss Skains in early January 2024 for elevated SSA.  It was felt that she did not have any autoimmune process She is over the income cutoff for assistance with Ofev  Pets: Cats Occupation: A board member of nonprofit organization Exposures: No mold, hot tub, Jacuzzi.  No feather pillows or comforters Smoking history: Never smoker Travel history: Previously lived in [place] Relevant family history: No family history of lung disease  Interim history: States that breathing is stable She started pirfenidone in early February 2024.  She went up on the dose to 2 pills 3 times a day and notes brain fog which is limiting her quality of life.  She has since cut back to half a tablet morning lunchtime and 1 tablet in the evening with tolerable symptoms.   Outpatient Encounter Medications as of 10/28/2022  Medication Sig   albuterol (PROAIR HFA) 108 (90 Base) MCG/ACT inhaler Inhale 2 puffs into the lungs every 4 (four) hours as needed.   Artificial Tear Solution (SYSTANE CONTACTS OP) Apply to eye.   aspirin 81 MG EC tablet Take 81 mg by mouth daily.   atorvastatin (LIPITOR) 20 MG tablet Take 20 mg by mouth.   BREO ELLIPTA 200-25 MCG/ACT AEPB Inhale 1 puff into the lungs daily.   budesonide (PULMICORT) 0.5 MG/2ML nebulizer  solution Use one vial mixed with an albuterol nebulizer vial every 6 hours during periods of respiratory distress for 1-2 weeks at a time.   Calcium Carbonate-Vitamin D 600-200 MG-UNIT TABS Take 2,000 tablets by mouth.    esomeprazole (NEXIUM) 20 MG capsule Take 1 capsule (20 mg total) by mouth every morning.   Esomeprazole Magnesium (NEXIUM 24HR PO) Take by mouth.   famotidine (PEPCID) 20 MG tablet Take 1 tablet (20 mg total) by mouth 2 (two) times daily.   fexofenadine (ALLEGRA) 180 MG tablet Take by mouth as needed.   fluticasone (FLONASE) 50 MCG/ACT nasal spray Administer 1 spray in each nostril daily.   fluticasone (FLONASE) 50 MCG/ACT nasal spray Place 1 spray into both nostrils daily.   Fluticasone Furoate (ARNUITY ELLIPTA) 200 MCG/ACT AEPB 1 puff 2 times daily for two weeks at a time duing respiratory infections or worsening symptoms.   Fluticasone Propionate,sensor, (ARMONAIR DIGIHALER) 113 MCG/ACT AEPB Inhale 1 puff into the lungs in the morning and at bedtime.   ibuprofen (ADVIL,MOTRIN) 200 MG tablet Take 200 mg by mouth as needed.   Immune Globulin-Hyaluronidase (HYQVIA) 30 GM/300ML KIT Inject 38 g into the skin every 21 ( twenty-one) days.   losartan (COZAAR) 25 MG tablet Take 50 mg by mouth daily.   metoprolol succinate (TOPROL-XL) 25 MG 24 hr tablet Take 25 mg by mouth daily.   montelukast (SINGULAIR) 10 MG tablet    Multiple Vitamin (MULTIVITAMIN) tablet Take by mouth.   Pirfenidone 267 MG TABS Take 1 tab three times  daily for 7 days, then 2 tabs three times daily for 7 days, then 3 tabs three times daily thereafter.   Pirfenidone 801 MG TABS Take 1 tablet (801 mg total) by mouth 3 (three) times daily with meals.   polyethylene glycol powder (GLYCOLAX/MIRALAX) 17 GM/SCOOP powder Take by mouth.   Romosozumab-aqqg (EVENITY [place]) Inject into the skin. 1 x per month   EPIPEN 2-PAK 0.3 MG/0.3ML SOAJ injection Inject 0.3 mLs (0.3 mg total) into the muscle as needed for anaphylaxis.  (Patient not taking: Reported on 10/28/2022)   No facility-administered encounter medications on file as of 10/28/2022.    Physical Exam: Blood pressure 132/68, pulse 87, temperature 98 F (36.7 C), temperature source Oral, height 5' (1.524 m), weight 130 lb (59 kg), SpO2 98 %. Gen:      No acute distress HEENT:  EOMI, sclera anicteric Neck:     No masses; no thyromegaly Lungs:    Clear to auscultation bilaterally; normal respiratory effort CV:         Regular rate and rhythm; no murmurs Abd:      + bowel sounds; soft, non-tender; no palpable masses, no distension Ext:    No edema; adequate peripheral perfusion Skin:      Warm and dry; no rash Neuro: alert and oriented x 3 Psych: normal mood and affect   Data Reviewed: Imaging: CT high-resolution 06/25/2015.  Report from [facility] Mild to moderate cylindrical bronchiectasis in lingula and bilateral lower lobes.  Patchy consolidation and groundglass in lingula and bilateral lower lobes. Fine groundglass centrilobular nodularity in the upper lungs  CT chest 02/26/2016 Mild subpleural reticular densities and bronchiectasis at the base.  Aortic and coronary atherosclerosis  High-resolution CT 08/25/2021 Reduce of groundglass attenuation and septal thickening and bronchovascular interstitial lung with cylindrical bronchiectasis.  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  High-resolution CT 02/19/2022.  Redemonstration of groundglass attenuation, septal thickening and bronchiectasis I have reviewed the images personally.  PFTs: Spirometry 10/26/2020 FVC 1.75 [75%], FEV1 1.57 [91%], F/F 19 No obstruction, restriction possible  10/08/2021 FVC 1.68 [72%], FEV1 1.18 [68%], F/F 70, TLC 2.92 [65%], DLCO 9.47 [57%] Moderate restriction and diffusion defect  Labs: CTD serologies 09/28/2021-ANA 1:40 nucleolar homogeneous, SSA 28  Assessment:  IPF She has history of bronchiectasis was presumed secondary to chronic immunodeficiency Further review of CT scan  shows some fibrotic changes.  She did have mild reticulation at the base with progression since 2017.  This raises the question of CTD ILD or UIP pulmonary fibrosis  Noted to have elevation in ANA and SSA. She does have dry mouth and dry ice raising the possibility of Sjogren's syndrome.  Rheumatology evaluation is pending  We reviewed her case at multidisciplinary conference on June 2023.  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  Over the income cutoff for Ofev assistance.  Currently on pirfenidone.  She is asked a low-dose as she cannot tolerate the higher dose due to brain fog.  Even if she gets some of the medication I believe it would be beneficial to slow the progression.  Check monthly hepatic panel. Order high-res CT and PFTs in 6 months  Severe persistent asthma On Breo, ProAir. Arnuity add-on started by Dr. Dellis Anes  GERD Continue omeprazole  Chronic rhinitis Continue Flonase  Plan/Recommendations: Continue pirfenidone at low-dose Mucociliary clearance with Acapella Continue inhalers for asthma High-res CT and PFTs in 6 months  Chilton Greathouse MD [facility] 10/28/2022, 11:46 AM  CC: Malka SoJobe, Daniel B., MD

## 2022-10-28 NOTE — Patient Instructions (Signed)
Will check comprehensive metabolic panel every month for the next 2 months Continue the pirfenidone as tolerated Will order high-res CT and PFTs in 6 months Return to clinic in 6 months.

## 2022-10-31 ENCOUNTER — Other Ambulatory Visit (HOSPITAL_COMMUNITY): Payer: Self-pay

## 2022-11-04 ENCOUNTER — Other Ambulatory Visit (HOSPITAL_COMMUNITY): Payer: Self-pay

## 2022-11-12 ENCOUNTER — Other Ambulatory Visit (HOSPITAL_COMMUNITY): Payer: Self-pay

## 2022-11-12 ENCOUNTER — Other Ambulatory Visit: Payer: Self-pay | Admitting: Pharmacist

## 2022-11-12 DIAGNOSIS — J849 Interstitial pulmonary disease, unspecified: Secondary | ICD-10-CM

## 2022-11-12 MED ORDER — PIRFENIDONE 267 MG PO TABS
534.0000 mg | ORAL_TABLET | Freq: Three times a day (TID) | ORAL | 5 refills | Status: DC
  Filled 2022-11-12: qty 180, 30d supply, fill #0
  Filled 2022-12-12: qty 180, 30d supply, fill #1
  Filled 2023-01-10: qty 180, 30d supply, fill #2
  Filled 2023-03-03: qty 180, 30d supply, fill #3
  Filled 2023-03-26: qty 180, 30d supply, fill #4
  Filled 2023-04-28: qty 180, 30d supply, fill #5

## 2022-11-12 NOTE — Telephone Encounter (Signed)
Received notification from [place], [facility] at [facility] that patient has been splitting pirfenidone tablet which is not recommended in presciribing information.  Rx for pirfenidone  (2 tabs three times daily) sent to [facility] to allow patient to appropriately adjust.  Insurance will not cover 3 tabs three times daily but patient has barely been able to tolerate and therefore consistently take 2 tabs three times daily   LFTs 10/28/22 wnl  Chesley Mires, PharmD, MPH, BCPS, CPP Clinical Pharmacist (Rheumatology and Pulmonology)

## 2022-11-13 ENCOUNTER — Other Ambulatory Visit (HOSPITAL_COMMUNITY): Payer: Self-pay

## 2022-11-14 ENCOUNTER — Other Ambulatory Visit (HOSPITAL_COMMUNITY): Payer: Self-pay

## 2022-11-14 ENCOUNTER — Other Ambulatory Visit: Payer: Self-pay

## 2022-11-15 ENCOUNTER — Other Ambulatory Visit (HOSPITAL_COMMUNITY): Payer: Self-pay

## 2022-11-18 ENCOUNTER — Other Ambulatory Visit: Payer: Self-pay

## 2022-12-05 ENCOUNTER — Other Ambulatory Visit (HOSPITAL_COMMUNITY): Payer: Self-pay

## 2022-12-10 ENCOUNTER — Other Ambulatory Visit (INDEPENDENT_AMBULATORY_CARE_PROVIDER_SITE_OTHER): Payer: Medicare Other

## 2022-12-10 ENCOUNTER — Other Ambulatory Visit (HOSPITAL_COMMUNITY): Payer: Self-pay

## 2022-12-10 DIAGNOSIS — Z5181 Encounter for therapeutic drug level monitoring: Secondary | ICD-10-CM | POA: Diagnosis not present

## 2022-12-10 LAB — COMPREHENSIVE METABOLIC PANEL
ALT: 30 U/L (ref 0–35)
AST: 41 U/L — ABNORMAL HIGH (ref 0–37)
Albumin: 3.9 g/dL (ref 3.5–5.2)
Alkaline Phosphatase: 93 U/L (ref 39–117)
BUN: 11 mg/dL (ref 6–23)
CO2: 27 mEq/L (ref 19–32)
Calcium: 8.8 mg/dL (ref 8.4–10.5)
Chloride: 104 mEq/L (ref 96–112)
Creatinine, Ser: 0.75 mg/dL (ref 0.40–1.20)
GFR: 77.27 mL/min (ref >60.00)
Glucose, Bld: 92 mg/dL (ref 70–99)
Potassium: 3.9 mEq/L (ref 3.5–5.1)
Sodium: 139 mEq/L (ref 135–145)
Total Bilirubin: 0.3 mg/dL (ref 0.2–1.2)
Total Protein: 7.7 g/dL (ref 6.0–8.3)

## 2022-12-12 ENCOUNTER — Other Ambulatory Visit (HOSPITAL_COMMUNITY): Payer: Self-pay

## 2022-12-12 ENCOUNTER — Other Ambulatory Visit: Payer: Self-pay

## 2022-12-18 ENCOUNTER — Other Ambulatory Visit (HOSPITAL_COMMUNITY): Payer: Self-pay

## 2022-12-19 ENCOUNTER — Other Ambulatory Visit: Payer: Self-pay

## 2023-01-06 ENCOUNTER — Other Ambulatory Visit (INDEPENDENT_AMBULATORY_CARE_PROVIDER_SITE_OTHER): Payer: Medicare Other

## 2023-01-06 ENCOUNTER — Telehealth: Payer: Self-pay | Admitting: Pulmonary Disease

## 2023-01-06 DIAGNOSIS — Z5181 Encounter for therapeutic drug level monitoring: Secondary | ICD-10-CM | POA: Diagnosis not present

## 2023-01-06 NOTE — Telephone Encounter (Signed)
Pt presented to the front asking that Dr. Isaiah Serge call in a RX for her   Rx #: [id number]  Pirfenidone 267 MG TABS   [facility] Is her Pharm  The Pharmacy requested that he send this.

## 2023-01-07 LAB — COMPREHENSIVE METABOLIC PANEL
ALT: 19 U/L (ref 0–35)
AST: 27 U/L (ref 0–37)
Albumin: 3.9 g/dL (ref 3.5–5.2)
Alkaline Phosphatase: 65 U/L (ref 39–117)
BUN: 12 mg/dL (ref 6–23)
CO2: 27 mEq/L (ref 19–32)
Calcium: 8.7 mg/dL (ref 8.4–10.5)
Chloride: 105 mEq/L (ref 96–112)
Creatinine, Ser: 0.85 mg/dL (ref 0.40–1.20)
GFR: 66.45 mL/min (ref >60.00)
Glucose, Bld: 90 mg/dL (ref 70–99)
Potassium: 3.7 mEq/L (ref 3.5–5.1)
Sodium: 140 mEq/L (ref 135–145)
Total Bilirubin: 0.3 mg/dL (ref 0.2–1.2)
Total Protein: 7.6 g/dL (ref 6.0–8.3)

## 2023-01-08 NOTE — Telephone Encounter (Signed)
[facility] has refills of pirfenidone and it is written correctly as she is taking med 534 mg three times daily  Chesley Mires, PharmD, MPH, BCPS, CPP Clinical Pharmacist (Rheumatology and Pulmonology)

## 2023-01-10 ENCOUNTER — Other Ambulatory Visit (HOSPITAL_COMMUNITY): Payer: Self-pay

## 2023-01-17 ENCOUNTER — Other Ambulatory Visit (HOSPITAL_COMMUNITY): Payer: Self-pay

## 2023-01-28 ENCOUNTER — Ambulatory Visit: Payer: Medicare Other | Admitting: Allergy & Immunology

## 2023-01-28 ENCOUNTER — Ambulatory Visit: Payer: Medicare Other | Admitting: Allergy and Immunology

## 2023-01-29 ENCOUNTER — Encounter: Payer: Self-pay | Admitting: Internal Medicine

## 2023-01-29 ENCOUNTER — Other Ambulatory Visit: Payer: Self-pay

## 2023-01-29 ENCOUNTER — Ambulatory Visit (INDEPENDENT_AMBULATORY_CARE_PROVIDER_SITE_OTHER): Payer: Medicare Other | Admitting: Internal Medicine

## 2023-01-29 VITALS — BP 120/78 | HR 77 | Temp 98.5°F | Resp 16 | Ht 60.0 in | Wt 130.3 lb

## 2023-01-29 DIAGNOSIS — J310 Chronic rhinitis: Secondary | ICD-10-CM

## 2023-01-29 DIAGNOSIS — J4550 Severe persistent asthma, uncomplicated: Secondary | ICD-10-CM

## 2023-01-29 DIAGNOSIS — D839 Common variable immunodeficiency, unspecified: Secondary | ICD-10-CM | POA: Diagnosis not present

## 2023-01-29 DIAGNOSIS — J479 Bronchiectasis, uncomplicated: Secondary | ICD-10-CM | POA: Diagnosis not present

## 2023-01-29 MED ORDER — ALBUTEROL SULFATE HFA 108 (90 BASE) MCG/ACT IN AERS: 2.0000 | INHALATION_SPRAY | RESPIRATORY_TRACT | 2 refills | Status: AC | PRN

## 2023-01-29 MED ORDER — NEBULIZER AIR TUBE/PLUGS MISC: 0 refills | Status: DC

## 2023-01-29 MED ORDER — FEXOFENADINE HCL 180 MG PO TABS: 180.0000 mg | ORAL_TABLET | Freq: Every day | ORAL | 5 refills | Status: AC | PRN

## 2023-01-29 MED ORDER — BUDESONIDE 0.5 MG/2ML IN SUSP: RESPIRATORY_TRACT | 2 refills | Status: AC

## 2023-01-29 MED ORDER — FLUTICASONE PROPIONATE 50 MCG/ACT NA SUSP: 1.0000 | Freq: Every day | NASAL | 5 refills | Status: AC

## 2023-01-29 MED ORDER — FAMOTIDINE 20 MG PO TABS: 20.0000 mg | ORAL_TABLET | Freq: Two times a day (BID) | ORAL | 5 refills | Status: DC | PRN

## 2023-01-29 MED ORDER — BREO ELLIPTA 200-25 MCG/ACT IN AEPB: 1.0000 | INHALATION_SPRAY | Freq: Every day | RESPIRATORY_TRACT | 5 refills | Status: DC

## 2023-01-29 NOTE — Progress Notes (Addendum)
FOLLOW UP Date of Service/Encounter:  01/29/23   Subjective:  Janet Garcia (DOB: [birth year]) is a 77 y.o. female who [facility] for follow up for severe persistent asthma, CVID with bronchiectasis/ILD, chronic rhinitis, GERD.    History obtained from: chart review and patient. Last visit was on 09/19/2022 with Dr. Dellis Anes. On Hyqvia 38g every 3 weeks.  Also on Breo, Flonase, Allegra, Nexium, Pepcid, PRN [employer], Arnuity/Pulmicort nebs for flare ups.  Also has Acapella.    CVID: Doing well with Hyqvia, had one injection site with redness and hardness but improved with massaging.  She does switch up the sites and is compliant with it every 3 weeks.  Denies any infections or antibiotic use since last visit.     Asthma/Bronchiectasis: Doing okay overall.  Checks her O2 at home and usually around 97%.  Sometimes has a choking feeling and cough but denies much trouble with dyspnea/wheezing.  Doing Breo daily.  Rarely needs albuterol.  Denies any ER visits/oral prednisone since last visit for asthma.  Also followed by Pulm for IPF.  Does need new tubing for neb machine.  Saw Dr. Isaiah Serge Pulm 10/2022: stable breathing.  On Pirfenidone since 08/2022.  Does have brain fog with higher dosing. Planning for HRCT in 6 months.  CMP 12/2022 stable.   Rhinitis: Doing well on Flonase and Allegra PRN.  Sometimes has congestion and drainage but does okay with the medications.  GERD: Followed by GI.  Mostly well controlled with daily nexium and Pepcid.    Past Medical History: Past Medical History:  Diagnosis Date   Asthma    Bronchiectasis (HCC)    Immune deficiency disorder (HCC)    Pneumonia     Objective:  BP 120/78 (BP Location: Right Arm, Patient Position: Sitting, Cuff Size: Normal)   Pulse 77   Temp 98.5 F (36.9 C) (Temporal)   Resp 16   Ht 5' (1.524 m)   Wt 130 lb 4.8 oz (59.1 kg)   SpO2 95%   BMI 25.45 kg/m  Body mass index is 25.45  kg/m. Physical Exam: GEN: alert, well developed HEENT: clear conjunctiva, TM grey and translucent, nose with mildinferior turbinate hypertrophy, pink nasal mucosa, clear rhinorrhea,no cobblestoning HEART: regular rate and rhythm, no murmur LUNGS: clear to auscultation bilaterally, no coughing, unlabored respiration SKIN: no rashes or lesions  Spirometry:  Tracings reviewed. Her effort: Good reproducible efforts. FVC: 1.79L FEV1: 1.47L, 83% predicted FEV1/FVC ratio: 82% Interpretation: Spirometry consistent with normal pattern.  Please see scanned spirometry results for details.   Assessment:   1. Severe persistent asthma without complication   2. 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HCC)   3. Mixed rhinitis   4. Bronchiectasis without complication (HCC)     Plan/Recommendations:  1. Common variable immunodeficiency - Stable, no recent infections/abx use  - We will continue with the current dosing of Hyqvia (38 grams every 3 weeks).  - Continue with the premedication regimen.  - Can consider repeating IgG level at next visit.  You last IgG was stable in 05/2022.    2. Severe persistent asthma, uncomplicated - Stable, with normal spirometry today.  - Daily controller medication(s): Breo 200/25 mcg one puff once daily - Prior to physical activity: ProAir 2 puffs 10-15 minutes before physical activity. - Rescue medications: ProAir 1-2 puffs or 1 vial nebulized every 4-6 hours as needed.   - Changes during respiratory infections or worsening symptoms: Add on Pulmicort nebulizers 0.5mg  twice daily  for 1-2 weeks.   - You can also add on albuterol/Pulmicort neb BEFORE the Acapella device to open things up for a few weeks during flares.  - Asthma control goals:  * Full participation in all desired activities (may need albuterol before activity) * Albuterol use two time or less a week on average (not counting use with activity) * Cough interfering with sleep two time or less a  month * Oral steroids no more than once a year * No hospitalizations  3. Chronic rhinitis - Controlled  - Continue with Flonase one spray per nostril daily. - Consider using nasal saline rinses prior to using the Flonase.  - Use Allegra 180mg  daily as needed for runny nose, sneezing, itchy watery eyes.    4. Gastroesophageal reflux disease - Continue follow up with GI.   - Continue with the Nexium 20mg  in morning.   - Continue with Famotidine 20mg  twice daily as needed.      Return in about 4 months (around 06/01/2023).  Alesia Morin, MD [facility]

## 2023-01-29 NOTE — Patient Instructions (Addendum)
1. Common variable immunodeficiency - We will continue with the current dosing of Hyqvia (38 grams every 3 weeks).  - Continue with the premedication regimen.  - Can consider repeating IgG level at next visit.  You last IgG was stable in 05/2022.    2. Severe persistent asthma, uncomplicated - Daily controller medication(s): Breo 200/25 mcg one puff once daily - Prior to physical activity: ProAir 2 puffs 10-15 minutes before physical activity. - Rescue medications: ProAir 1-2 puffs or 1 vial nebulized every 4-6 hours as needed.   - Changes during respiratory infections or worsening symptoms: Add on Pulmicort nebulizers 0.5mg  twice daily for 1-2 weeks.   - You can also add on albuterol/Pulmicort neb BEFORE the Acapella device to open things up for a few weeks during flares.  - Asthma control goals:  * Full participation in all desired activities (may need albuterol before activity) * Albuterol use two time or less a week on average (not counting use with activity) * Cough interfering with sleep two time or less a month * Oral steroids no more than once a year * No hospitalizations  3. Chronic rhinitis - Continue with Flonase one spray per nostril daily. - Consider using nasal saline rinses prior to using the Flonase.  - Use Allegra 180mg  daily as needed for runny nose, sneezing, itchy watery eyes.    4. Gastroesophageal reflux disease - Continue follow up with GI.   - Continue with the Nexium 20mg  in morning.   - Continue with Famotidine 20mg  twice daily as needed.

## 2023-02-07 ENCOUNTER — Other Ambulatory Visit (HOSPITAL_COMMUNITY): Payer: Self-pay

## 2023-02-10 ENCOUNTER — Other Ambulatory Visit (HOSPITAL_COMMUNITY): Payer: Self-pay

## 2023-02-12 ENCOUNTER — Other Ambulatory Visit (HOSPITAL_COMMUNITY): Payer: Self-pay

## 2023-02-28 ENCOUNTER — Other Ambulatory Visit (HOSPITAL_COMMUNITY): Payer: Self-pay

## 2023-03-03 ENCOUNTER — Other Ambulatory Visit (HOSPITAL_COMMUNITY): Payer: Self-pay

## 2023-03-26 ENCOUNTER — Other Ambulatory Visit (HOSPITAL_COMMUNITY): Payer: Self-pay

## 2023-03-31 ENCOUNTER — Other Ambulatory Visit: Payer: Self-pay

## 2023-04-02 IMAGING — CT CT CHEST HIGH RESOLUTION
2 of 5 series · 12 of 36 positions shown, 15 images · non-contrast
Comparison: Chest CT 02/26/2016.

CLINICAL DATA: 75-year-old female with history of bronchiectasis.
Pneumonia. Worsening cough.



[Series 5: thorax · axial · 0.75mm/px · z∈[+1176,+1332]mm · 9 of 98 slices shown, 12 images]
[im 10/98  mediastinal]
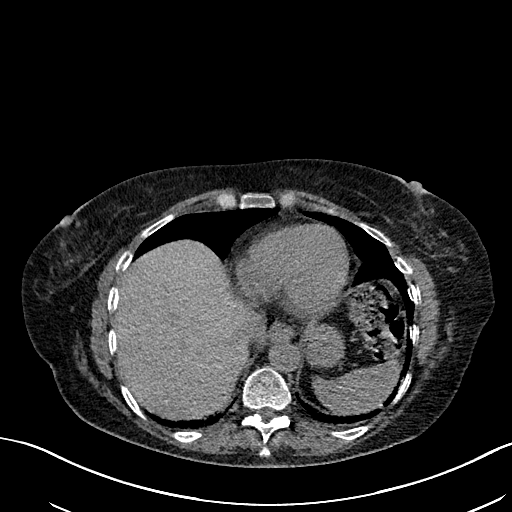
[im 10/98  lung]
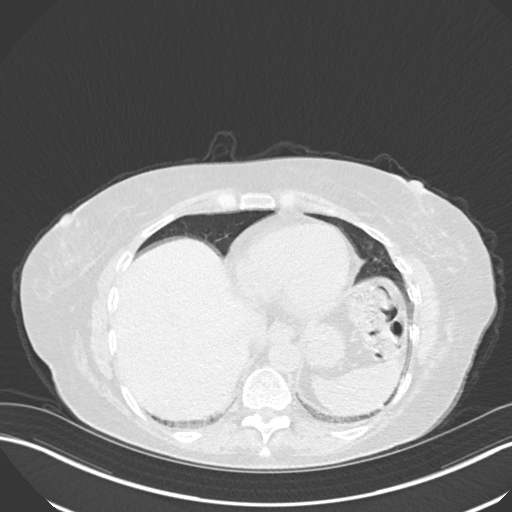
[im 19/98  lung]
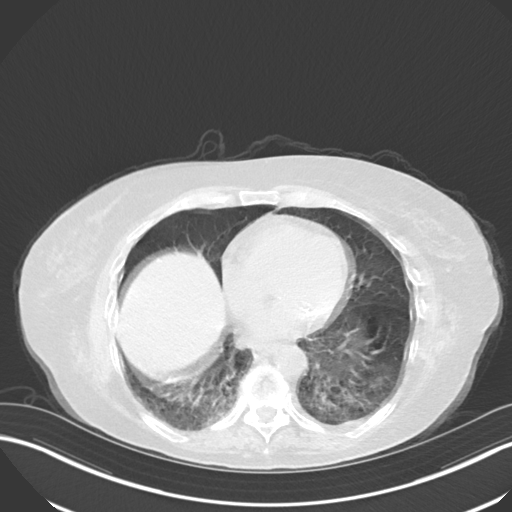
[im 28/98  lung]
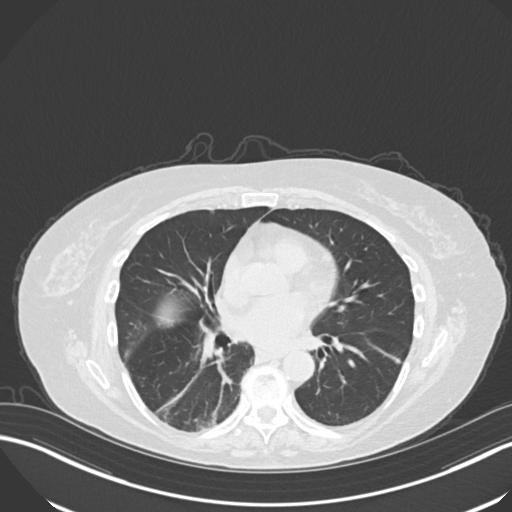
[im 37/98  lung]
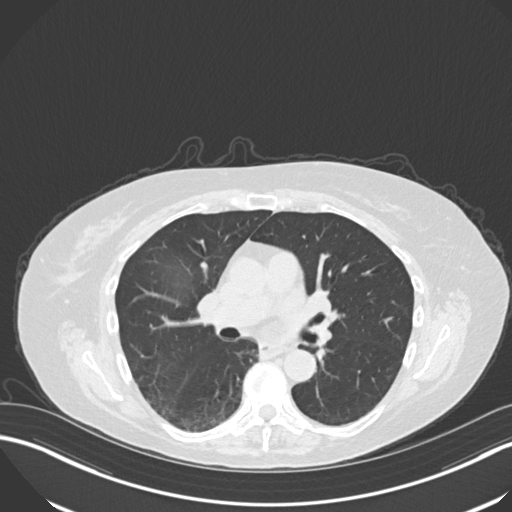
[im 51/98  mediastinal]
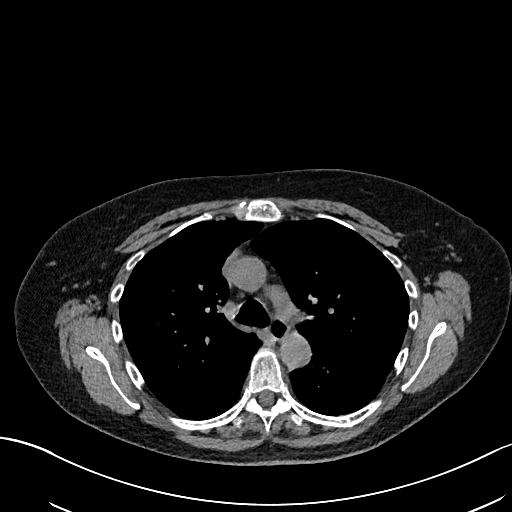
[im 51/98  lung]
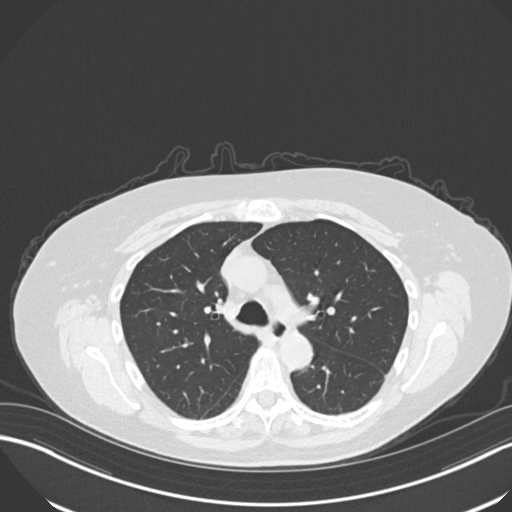
[im 61/98  lung]
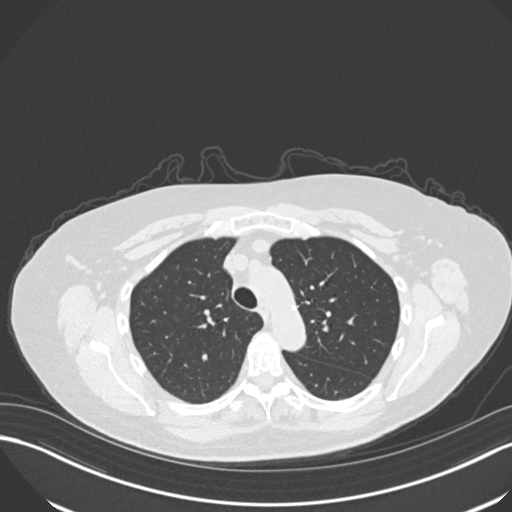
[im 70/98  lung]
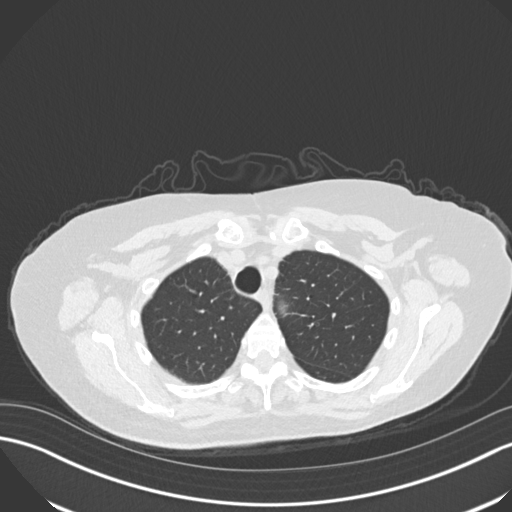
[im 79/98  lung]
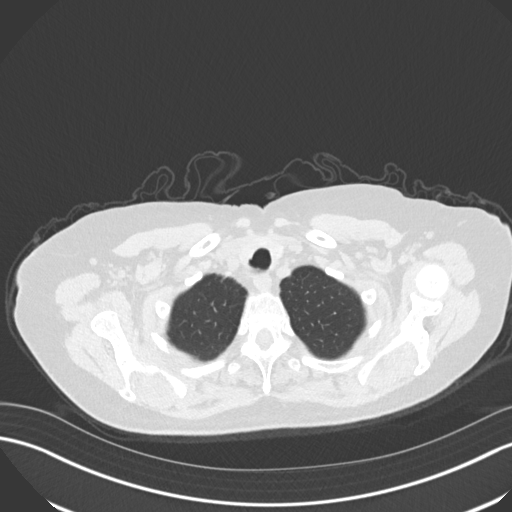
[im 88/98  mediastinal]
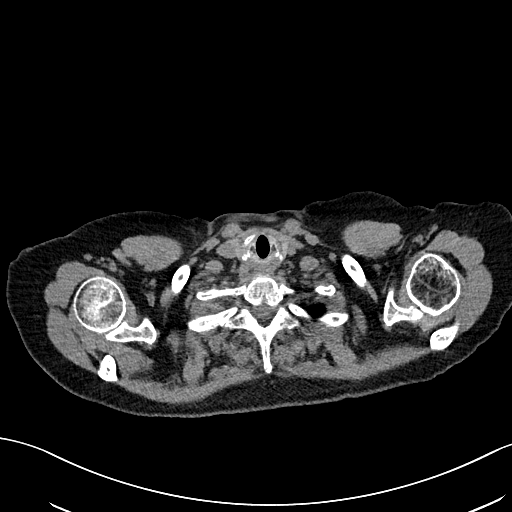
[im 88/98  lung]
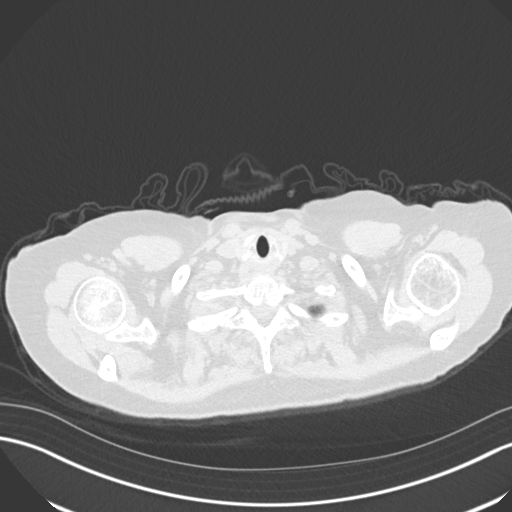

[Series 8: coronal · coronal · 0.39mm/px · 3 of 119 slices shown]
[im 24/119  lung]
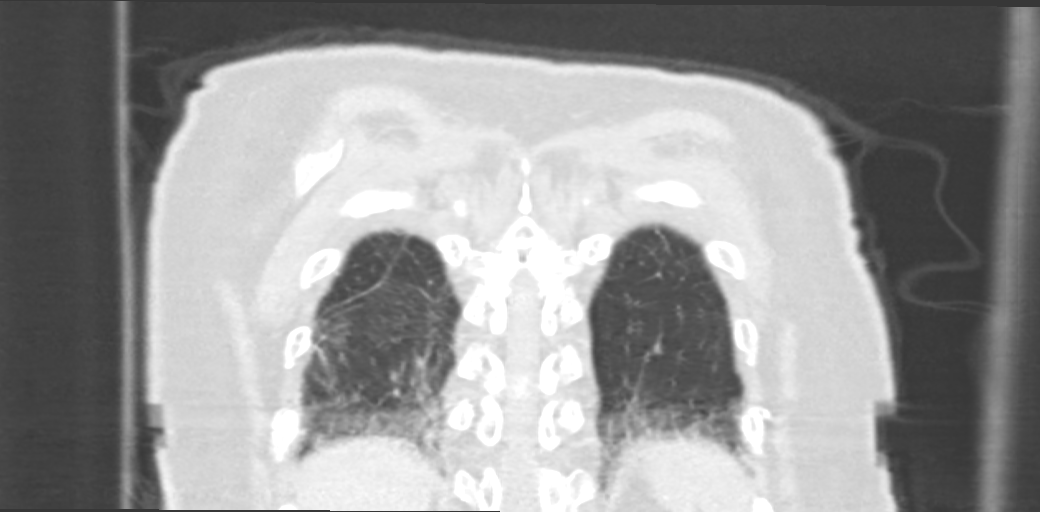
[im 48/119  lung]
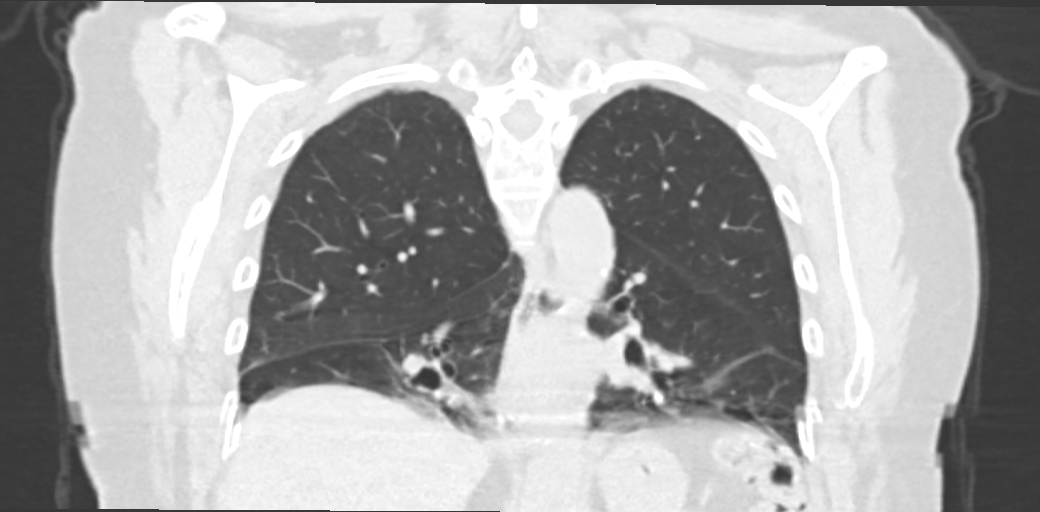
[im 71/119  lung]
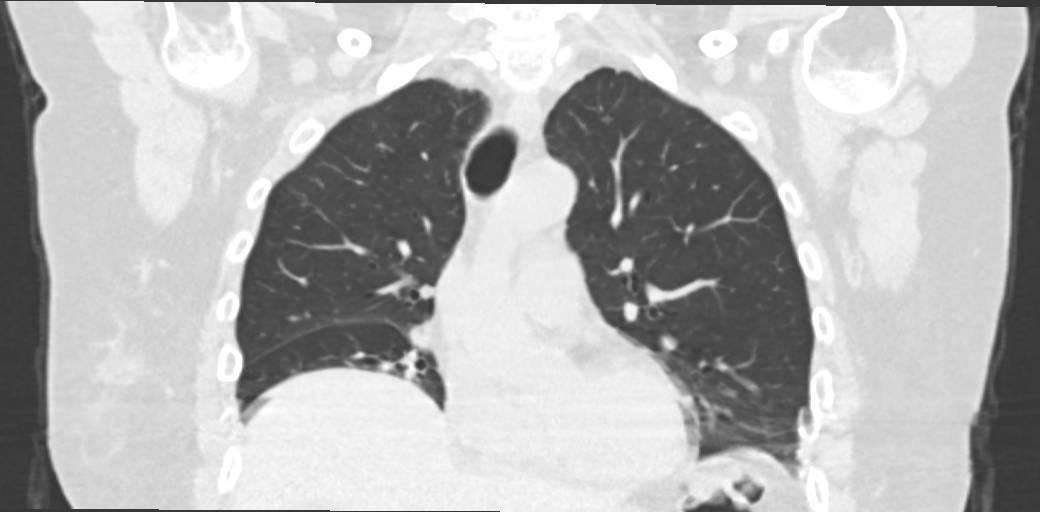

[12 of 36 positions shown; findings below may reference images not displayed]

FINDINGS: Cardiovascular: Heart size is normal. There is no significant
pericardial fluid, thickening or pericardial calcification. Aortic
atherosclerosis. No definite coronary artery calcifications.
Calcifications of the mitral annulus.

Mediastinum/Nodes: No pathologically enlarged mediastinal or hilar
lymph nodes. Esophagus is unremarkable in appearance. No axillary
lymphadenopathy.

Lungs/Pleura: Unfortunately, portions of today's examination are
limited by considerable patient respiratory motion. Specifically,
the lung bases are largely obscured. With these limitations in mind,
there does appear to be areas of ground-glass attenuation, septal
thickening, thickening of the peribronchovascular interstitium, mild
cylindrical bronchiectasis and some regional architectural
distortion and volume loss in the lung bases, most evident in the
lower lobes. No definite honeycombing is noted. Lung parenchyma
elsewhere otherwise appears generally normal. No acute consolidative
airspace disease. No pleural effusions. No suspicious appearing
pulmonary nodules or masses are confidently identified on today's
motion limited examination. Inspiratory and expiratory imaging is
essentially unremarkable in the diagnostic portions, and obscured in
other portions.

Upper Abdomen: Aortic atherosclerosis.

Musculoskeletal: New compression fractures of T7 and T9 with post
vertebroplasty changes at both level, most severe at T7 where there
is 40% loss of anterior vertebral body height. There are no
aggressive appearing lytic or blastic lesions noted in the
visualized portions of the skeleton.
IMPRESSION: 1. Limited study secondary to patient respiratory motion. With these
limitations in mind, there are imaging features in the lung bases
which are concerning for interstitial lung disease, and if correctly
characterized on today's motion limited examination would be
categorized as probable usual interstitial pneumonia (UIP) per
current ATS guidelines. Repeat high-resolution chest CT is
recommended in 6-12 months to assess for temporal changes in the
appearance of the lung parenchyma.
2. Aortic atherosclerosis.

Aortic Atherosclerosis ([id number]-3HJ.J).

## 2023-04-28 ENCOUNTER — Other Ambulatory Visit (HOSPITAL_COMMUNITY): Payer: Self-pay | Admitting: Pharmacy Technician

## 2023-04-28 ENCOUNTER — Other Ambulatory Visit (HOSPITAL_COMMUNITY): Payer: Self-pay

## 2023-04-28 NOTE — Progress Notes (Signed)
Specialty Pharmacy Refill Coordination Note  Janet Garcia is a 78 y.o. female contacted today regarding refills of specialty medication(s) Pirfenidone   Patient requested Delivery   Delivery date: 05/05/23   Verified address: [address] [place]   Medication will be filled on 05/02/23.

## 2023-05-02 ENCOUNTER — Other Ambulatory Visit: Payer: Self-pay

## 2023-05-03 ENCOUNTER — Other Ambulatory Visit: Payer: Self-pay | Admitting: Allergy & Immunology

## 2023-05-12 ENCOUNTER — Ambulatory Visit
Admission: RE | Admit: 2023-05-12 | Discharge: 2023-05-12 | Disposition: A | Discharge status: Home / Self Care | Payer: Medicare Other | Source: Ambulatory Visit | Attending: Pulmonary Disease | Admitting: Pulmonary Disease

## 2023-05-12 DIAGNOSIS — J849 Interstitial pulmonary disease, unspecified: Secondary | ICD-10-CM

## 2023-05-15 ENCOUNTER — Ambulatory Visit (INDEPENDENT_AMBULATORY_CARE_PROVIDER_SITE_OTHER): Payer: Medicare Other | Admitting: Pulmonary Disease

## 2023-05-15 ENCOUNTER — Other Ambulatory Visit: Payer: Self-pay | Admitting: Pulmonary Disease

## 2023-05-15 ENCOUNTER — Encounter: Payer: Self-pay | Admitting: Pulmonary Disease

## 2023-05-15 VITALS — BP 120/70 | HR 88 | Ht 60.0 in | Wt 129.6 lb

## 2023-05-15 DIAGNOSIS — R0689 Other abnormalities of breathing: Secondary | ICD-10-CM

## 2023-05-15 DIAGNOSIS — Z5181 Encounter for therapeutic drug level monitoring: Secondary | ICD-10-CM

## 2023-05-15 DIAGNOSIS — J849 Interstitial pulmonary disease, unspecified: Secondary | ICD-10-CM

## 2023-05-15 DIAGNOSIS — R0600 Dyspnea, unspecified: Secondary | ICD-10-CM

## 2023-05-15 NOTE — Patient Instructions (Signed)
VISIT SUMMARY:  You had a follow-up visit today to discuss your recent CT scan and ongoing management of your pulmonary fibrosis, asthma, chronic rhinitis, and GERD. You are currently tolerating your medications with some difficulty and are hopeful for improvement. We reviewed your current treatment plans and made some recommendations for ongoing care.  YOUR PLAN:  -IDIOPATHIC PULMONARY FIBROSIS (IPF): Idiopathic Pulmonary Fibrosis is a lung disease that causes scarring of the lungs, making it difficult to breathe. You are currently taking Pirfenidone, two tablets three times a day, and we will continue this as tolerated. We have ordered labs today, including liver function tests and BNP, and scheduled pulmonary function tests in 6 months. Please follow up in 6 months or sooner if your symptoms worsen.  -GASTROESOPHAGEAL REFLUX DISEASE (GERD): GERD is a condition where stomach acid frequently flows back into the esophagus, causing irritation. You are taking Esomeprazole and Pepcid twice a day. We recommend continuing these medications and eating small, frequent meals to help reduce reflux symptoms.  -ASTHMA: Asthma is a condition in which your airways narrow and swell, producing extra mucus and making it difficult to breathe. Your asthma is well-controlled with Breo, Singulair, and Albuterol as needed. Please continue your current asthma management.  -CHRONIC RHINITIS: Chronic Rhinitis is a condition characterized by chronic sneezing, congestion, or runny nose. You are managing this with Flonase. Please continue using Flonase as prescribed.  -HIATAL HERNIA: A Hiatal Hernia occurs when the upper part of your stomach bulges through the diaphragm into your chest cavity. This is being managed by your gastroenterologist, Dr. Janalyn Shy, and no changes to your current management plan are needed.  -RHEUMATOLOGY: You were evaluated by a rheumatologist, Dr. Corliss Skains, who found no active rheumatologic process.  No further follow-up is needed at this time.  INSTRUCTIONS:  Please follow up in 6 months for your pulmonary function tests and sooner if your symptoms worsen. Continue with your current medications and management plans as discussed. Labs have been ordered today, including liver function tests and BNP.

## 2023-05-15 NOTE — Progress Notes (Signed)
Janet Garcia    [id number]    May 29, 1946  Primary Care Physician:Jobe, Garrison [place]., MD  Referring Physician: [name], MD No address on file  Chief complaint: Follow-up for Interstitial lung disease, asthma Started pirfenidone in February 2024  HPI: 77 y.o.  with history of severe persistent asthma, common variable immunodeficiency, GERD She follows with Dr. Dellis Anes and is on immunoglobulin subcutaneous injections.  She has history of bronchiectasis previously followed by Dr. Su Monks.  She has been referred for evaluation of bronchiectasis Complains of occasional productive cough, dyspnea with wheezing.  She has indigestion from GERD.  Saw Dr. Corliss Skains in early January 2024 for elevated SSA.  It was felt that she did not have any autoimmune process She is over the income cutoff for assistance with Ofev She started pirfenidone in early February 2024.  She went up on the dose to 2 pills 3 times a day and notes brain fog which is limiting her quality of life.   Pets: Cats Occupation: A Administrator, sports Exposures: No mold, hot tub, Jacuzzi.  No feather pillows or comforters Smoking history: Never smoker Travel history: Previously lived in [place] Relevant family history: No family history of lung disease  Interim history: Discussed the use of AI scribe software for clinical note transcription with the patient, who gave verbal consent to proceed.   The patient, with a history of pulmonary fibrosis, asthma, chronic rhinitis, and gastroesophageal reflux disease (GERD), presents for a follow-up visit after a recent CT scan. She reports tolerating the pirfenidone, albeit with difficulty, and is currently taking two tablets three times a day instead of the full dose of three tablets. She expresses hope that the CT scan will show some improvement in her condition.  The patient also has asthma, which is well-controlled with Breo, Pulmicort  nebulizer as needed, albuterol inhaler as needed, and Singulair. She recently saw her pulmonologist, Dr. Dellis Anes, for a follow-up. She also has chronic rhinitis, which is managed with Flonase.  In addition, the patient has GERD, which is managed with esomeprazole and Pepcid. She reports that her gastroenterologist, Dr. Janalyn Shy, increased her medication due to damage to her esophagus from acid reflux. She also has a hiatal hernia, which she believes may be contributing to her lung condition. She reports coughing after eating and tries to eat small meals throughout the day to reduce reflux.  The patient was also evaluated by a rheumatologist, Dr. Corliss Skains, who ruled out any rheumatologic processes. She has no pending follow-ups with rheumatology.   Outpatient Encounter Medications as of 05/15/2023  Medication Sig   gabapentin (NEURONTIN) 100 MG capsule Take 100 mg by mouth 3 (three) times daily.   albuterol (PROAIR HFA) 108 (90 Base) MCG/ACT inhaler Inhale 2 puffs into the lungs every 4 (four) hours as needed.   Artificial Tear Solution (SYSTANE CONTACTS OP) Apply to eye.   aspirin 81 MG EC tablet Take 81 mg by mouth daily.   atorvastatin (LIPITOR) 20 MG tablet Take 20 mg by mouth.   BREO ELLIPTA 200-25 MCG/ACT AEPB Inhale 1 puff into the lungs daily.   budesonide (PULMICORT) 0.5 MG/2ML nebulizer solution Use one vial mixed with an albuterol nebulizer vial every 6 hours during periods of respiratory distress for 1-2 weeks at a time.   Cholecalciferol (VITAMIN D3) 50 MCG (2000 UT) capsule Take 1 capsule by mouth daily.   EPIPEN 2-PAK 0.3 MG/0.3ML SOAJ injection Inject 0.3 mLs (0.3 mg total)  into the muscle as needed for anaphylaxis.   escitalopram (LEXAPRO) 10 MG tablet Take 10 mg by mouth daily.   esomeprazole (NEXIUM) 20 MG capsule TAKE 1 CAPSULE(20 MG) BY MOUTH EVERY MORNING   famotidine (PEPCID) 20 MG tablet Take 1 tablet (20 mg total) by mouth 2 (two) times daily as needed for heartburn or  indigestion.   fexofenadine (ALLEGRA) 180 MG tablet Take 1 tablet (180 mg total) by mouth daily as needed for rhinitis.   fluticasone (FLONASE) 50 MCG/ACT nasal spray Place 1 spray into both nostrils daily.   ibuprofen (ADVIL,MOTRIN) 200 MG tablet Take 200 mg by mouth as needed.   Immune Globulin-Hyaluronidase (HYQVIA) 30 GM/300ML KIT Inject 38 g into the skin every 21 ( twenty-one) days.   losartan (COZAAR) 25 MG tablet Take 50 mg by mouth daily.   metoprolol succinate (TOPROL-XL) 25 MG 24 hr tablet Take 25 mg by mouth daily.   montelukast (SINGULAIR) 10 MG tablet    Multiple Vitamin (MULTIVITAMIN) tablet Take by mouth.   Pirfenidone 267 MG TABS Take 2 tablets (534 mg total) by mouth 3 (three) times daily with meals.   polyethylene glycol powder (GLYCOLAX/MIRALAX) 17 GM/SCOOP powder Take by mouth.   Respiratory Therapy Supplies (NEBULIZER AIR TUBE/PLUGS) MISC Please provide replacement tubing for nebulizer machine.   No facility-administered encounter medications on file as of 05/15/2023.    Physical Exam: Blood pressure 120/70, pulse 88, height 5' (1.524 m), weight 129 lb 9.6 oz (58.8 kg), SpO2 97%. Gen:      No acute distress HEENT:  EOMI, sclera anicteric Neck:     No masses; no thyromegaly Lungs:    Clear to auscultation bilaterally; normal respiratory effort CV:         Regular rate and rhythm; no murmurs Abd:      + bowel sounds; soft, non-tender; no palpable masses, no distension Ext:    No edema; adequate peripheral perfusion Skin:      Warm and dry; no rash Neuro: alert and oriented x 3 Psych: normal mood and affect   Data Reviewed: Imaging: CT high-resolution 06/25/2015.  Report from [facility] Mild to moderate cylindrical bronchiectasis in lingula and bilateral lower lobes.  Patchy consolidation and groundglass in lingula and bilateral lower lobes. Fine groundglass centrilobular nodularity in the upper lungs  CT chest 02/26/2016 Mild subpleural reticular densities and  bronchiectasis at the base.  Aortic and coronary atherosclerosis  High-resolution CT 08/25/2021 Reduce of groundglass attenuation and septal thickening and bronchovascular interstitial lung with cylindrical bronchiectasis.  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  High-resolution CT 02/19/2022.  Redemonstration of groundglass attenuation, septal thickening and bronchiectasis I have reviewed the images personally.  PFTs: Spirometry 10/26/2020 FVC 1.75 [75%], FEV1 1.57 [91%], F/F 19 No obstruction, restriction possible  10/08/2021 FVC 1.68 [72%], FEV1 1.18 [68%], F/F 70, TLC 2.92 [65%], DLCO 9.47 [57%] Moderate restriction and diffusion defect  Labs: CTD serologies 09/28/2021-ANA 1:40 nucleolar homogeneous, SSA 28  Assessment:  IPF She has history of bronchiectasis was presumed secondary to chronic immunodeficiency Further review of CT scan shows some fibrotic changes.  She did have mild reticulation at the base with progression since 2017.  This raises the question of CTD ILD or UIP pulmonary fibrosis  Noted to have elevation in ANA and SSA. She does have dry mouth and dry ice raising the possibility of Sjogren's syndrome.  Rheumatology evaluation is pending  We reviewed her case at multidisciplinary conference on June 2023.  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  Over the income cutoff for Ofev assistance.  On Pirfenidone 2 tablets TID, tolerating with difficulty. She is on low-dose as she cannot tolerate the higher dose due to brain fog.  Even if she gets some of the medication I believe it would be beneficial to slow the progression.  Recent CT scan may show slight progression, awaiting official read.  -Continue Pirfenidone as tolerated. -Order labs today including liver function tests and BNP. -Schedule pulmonary function tests in 6 months. -Follow up in 6 months or sooner if symptoms worsen.  Gastroesophageal  Reflux Disease (GERD) On Esomeprazole and Pepcid BID, prescribed by gastroenterologist due to large esophageal opening. Reports discomfort after eating large meals. -Continue Esomeprazole and Pepcid BID. -Advise small, frequent meals to reduce reflux symptoms.  Asthma Stable on Breo, Singulair, and Albuterol as needed. Reports good control and recent follow up with Dr. Dellis Anes. -Continue current asthma management.  Chronic Rhinitis Stable on Flonase. -Continue Flonase as prescribed.  Hiatal Hernia Managed by gastroenterologist Dr. Janalyn Shy. -No changes to current management plan.  Rheumatology No active rheumatologic process identified by Dr. Corliss Skains. -No further follow up needed at this time.   Plan/Recommendations: Continue pirfenidone at low-dose Check labs for monitoring Mucociliary clearance with Acapella Continue inhalers for asthma PFTs in 6 months  Chilton Greathouse MD [facility] 05/15/2023, 2:40 PM  CC: [name], MD

## 2023-05-16 LAB — COMPREHENSIVE METABOLIC PANEL
ALT: 19 U/L (ref 0–35)
AST: 24 U/L (ref 0–37)
Albumin: 3.6 g/dL (ref 3.5–5.2)
Alkaline Phosphatase: 101 U/L (ref 39–117)
BUN: 15 mg/dL (ref 6–23)
CO2: 26 meq/L (ref 19–32)
Calcium: 8.8 mg/dL (ref 8.4–10.5)
Chloride: 103 meq/L (ref 96–112)
Creatinine, Ser: 0.96 mg/dL (ref 0.40–1.20)
GFR: 57.28 mL/min — ABNORMAL LOW (ref >60.00)
Glucose, Bld: 90 mg/dL (ref 70–99)
Potassium: 4.3 meq/L (ref 3.5–5.1)
Sodium: 138 meq/L (ref 135–145)
Total Bilirubin: 0.3 mg/dL (ref 0.2–1.2)
Total Protein: 7 g/dL (ref 6.0–8.3)

## 2023-05-16 LAB — HEPATIC FUNCTION PANEL
ALT: 19 U/L (ref 0–35)
AST: 24 U/L (ref 0–37)
Albumin: 3.6 g/dL (ref 3.5–5.2)
Alkaline Phosphatase: 101 U/L (ref 39–117)
Bilirubin, Direct: 0.1 mg/dL (ref 0.0–0.3)
Total Bilirubin: 0.3 mg/dL (ref 0.2–1.2)
Total Protein: 7 g/dL (ref 6.0–8.3)

## 2023-05-21 ENCOUNTER — Other Ambulatory Visit: Payer: Self-pay

## 2023-05-23 ENCOUNTER — Other Ambulatory Visit: Payer: Self-pay | Admitting: Pulmonary Disease

## 2023-05-23 ENCOUNTER — Other Ambulatory Visit (HOSPITAL_COMMUNITY): Payer: Self-pay

## 2023-05-23 ENCOUNTER — Other Ambulatory Visit: Payer: Self-pay

## 2023-05-23 DIAGNOSIS — J849 Interstitial pulmonary disease, unspecified: Secondary | ICD-10-CM

## 2023-05-23 NOTE — Progress Notes (Signed)
Clinical Intervention Note  Clinical Intervention Notes: Patient has started taking Gabapentin for back pain, no DDIs identified.   Clinical Intervention Outcomes: Prevention of an adverse drug event   Otto Herb Specialty Pharmacist

## 2023-05-23 NOTE — Progress Notes (Signed)
Specialty Pharmacy Refill Coordination Note  Janet Garcia is a 77 y.o. female contacted today regarding refills of specialty medication(s) Pirfenidone   Patient requested Delivery   Delivery date: 05/28/23   Verified address: [address] [place] [address]   Medication will be filled on 05/27/23.

## 2023-05-23 NOTE — Progress Notes (Signed)
Specialty Pharmacy Ongoing Clinical Assessment Note  Janet Garcia is a 77 y.o. female who is being followed by the specialty pharmacy service for RxSp Interstitial Lung Disease   Patient's specialty medication(s) reviewed today: Pirfenidone   Missed doses in the last 4 weeks: 3   Patient/Caregiver did not have any additional questions or concerns.   Therapeutic benefit summary: Patient is achieving benefit   Adverse events/side effects summary: Experienced adverse events/side effects (Provider aware patient taking 2 tablets 3 times daily to avoid adverse effects.)   Patient's therapy is appropriate to: Continue    Goals Addressed             This Visit's Progress    Stabilization of disease       Patient is on track. Patient will maintain adherence. Provider expects that there may have been progression on October scan but report was not yet available at appointment.          Follow up:  6 months  Otto Herb Specialty Pharmacist

## 2023-05-29 ENCOUNTER — Telehealth: Payer: Self-pay | Admitting: Pulmonary Disease

## 2023-05-29 NOTE — Telephone Encounter (Signed)
PT calling w/ins question on her Perfinadone. Pls call @ [phone]

## 2023-05-30 ENCOUNTER — Other Ambulatory Visit: Payer: Self-pay | Admitting: Allergy & Immunology

## 2023-06-03 ENCOUNTER — Other Ambulatory Visit: Payer: Self-pay

## 2023-06-03 ENCOUNTER — Ambulatory Visit (INDEPENDENT_AMBULATORY_CARE_PROVIDER_SITE_OTHER): Payer: Medicare Other | Admitting: Allergy & Immunology

## 2023-06-03 ENCOUNTER — Encounter: Payer: Self-pay | Admitting: Allergy & Immunology

## 2023-06-03 VITALS — BP 124/70 | HR 88 | Temp 98.4°F | Resp 16 | Ht 60.0 in | Wt 130.3 lb

## 2023-06-03 DIAGNOSIS — E538 Deficiency of other specified B group vitamins: Secondary | ICD-10-CM

## 2023-06-03 DIAGNOSIS — D839 Common variable immunodeficiency, unspecified: Secondary | ICD-10-CM | POA: Diagnosis not present

## 2023-06-03 DIAGNOSIS — J310 Chronic rhinitis: Secondary | ICD-10-CM | POA: Diagnosis not present

## 2023-06-03 DIAGNOSIS — J4550 Severe persistent asthma, uncomplicated: Secondary | ICD-10-CM

## 2023-06-03 DIAGNOSIS — R5383 Other fatigue: Secondary | ICD-10-CM

## 2023-06-03 NOTE — Patient Instructions (Addendum)
1. Common variable immunodeficiency - We will continue with the current dosing of Hyqvia (38 grams every 3 weeks).  - We are going to get immunoglobulin levels today.  - Continue with the premedication regimen.   2. Severe persistent asthma, uncomplicated - Lung testing looks very stable today. - It certainly is not consistent with the chest CT findings, but I am reassured that it looks normal.  - Daily controller medication(s): Breo 200/25 mcg one puff once daily - Prior to physical activity: ProAir 2 puffs 10-15 minutes before physical activity. - Rescue medications: ProAir 4 puffs every 4-6 hours as needed or albuterol nebulizer one vial every 4-6 hours as needed - Changes during respiratory infections or worsening symptoms: Add on Arnuity to  one inhalation  twice daily for TWO WEEKS. - You can also add on albuterol/Pulmicort neb BEFORE the Acapella device to open things up for a few weeks during flares.  - Asthma control goals:  * Full participation in all desired activities (may need albuterol before activity) * Albuterol use two time or less a week on average (not counting use with activity) * Cough interfering with sleep two time or less a month * Oral steroids no more than once a year * No hospitalizations  3. Chronic rhinitis - Continue with Flonase one spray per nostril daily. - Consider using nasal saline rinses prior to using the Flonase.  - Continue with Allegra every other day should be fine.   4. Gastroesophageal reflux disease - Continue with the Nexium in the morning. - Continue with famotidine twice daily.  5. Return in about 4 months (around 10/01/2023).    Please inform us of any Emergency Department visits, hospitalizations, or changes in symptoms. Call us before going to the ED for breathing or allergy symptoms since we might be able to fit you in for a sick visit. Feel free to contact us anytime with any questions, problems, or concerns.  It was a  pleasure to see you again today!  Websites that have reliable patient information: 1. American Academy of Asthma, Allergy, and Immunology: www.aaaai.org 2. Food Allergy Research and Education (FARE): foodallergy.org 3. Mothers of Asthmatics: http://www.asthmacommunitynetwork.org 4. American College of Allergy, Asthma, and Immunology: www.acaai.org      "Like" [place] on Facebook and Instagram for our latest updates!      A healthy democracy works best when [employer] participate! Make sure you are registered to vote! If you have moved or changed any of your contact information, you will need to get this updated before voting! Scan the QR codes below to learn more!

## 2023-06-03 NOTE — Progress Notes (Unsigned)
FOLLOW UP  Date of Service/Encounter:  06/03/23   Assessment:   Severe persistent asthma without complication   Common variable immunodeficiency - on HyQvia every three weeks (recently increased dose to 38 grams every 3 weeks December 2022)   Anemia of chronic disease   B12 deficiency - improved on B12 supplementation   Bronchiectasis without complication - previously followed by Dr. Su Monks, now followed by Dr. Isaiah Serge with transition to more of a UIP picture and being treated with pirfenidone    Chronic rhinitis   Gastroesophageal reflux disease - on a PPI   Chronic back pain - s/p kyphoplasty October 2022   Fatigue - previously improved, but worsening as of late   Paternal history unknown (father might have died from emphysema, but largely unknown)  Plan/Recommendations:   Assessment and Plan    Interstitial Lung Disease Recent CT chest showed progression of scarring. Patient reports tolerating medication but experiences significant side effects with higher doses, leading to inconsistent use. -Continue current medication regimen at tolerable dose (1.5 tablets three times a day). -Plan for full pulmonary function testing in February or March.  Immunodeficiency Patient self-administers immunoglobulin infusions every three weeks. No recent infections reported. -Continue current immunoglobulin regimen. -Order immunoglobulin levels to monitor therapy.  Asthma Controlled on Breo. Patient reports noticeable symptoms if a dose is missed. -Continue Breo as prescribed. -Check if Breo samples are available due to patient's current coverage gap ("donut hole").  General Health Maintenance -Order routine labs as it has been a while since the last set.         Patient Instructions  1. Common variable immunodeficiency - We will continue with the current dosing of Hyqvia (38 grams every 3 weeks).  - We are going to get immunoglobulin levels today.  - Continue with the  premedication regimen.   2. Severe persistent asthma, uncomplicated - Lung testing looks very stable today. - It certainly is not consistent with the chest CT findings, but I am reassured that it looks normal.  - Daily controller medication(s): Breo 200/25 mcg one puff once daily - Prior to physical activity: ProAir 2 puffs 10-15 minutes before physical activity. - Rescue medications: ProAir 4 puffs every 4-6 hours as needed or albuterol nebulizer one vial every 4-6 hours as needed - Changes during respiratory infections or worsening symptoms: Add on Arnuity to  one inhalation  twice daily for TWO WEEKS. - You can also add on albuterol/Pulmicort neb BEFORE the Acapella device to open things up for a few weeks during flares.  - Asthma control goals:  * Full participation in all desired activities (may need albuterol before activity) * Albuterol use two time or less a week on average (not counting use with activity) * Cough interfering with sleep two time or less a month * Oral steroids no more than once a year * No hospitalizations  3. Chronic rhinitis - Continue with Flonase one spray per nostril daily. - Consider using nasal saline rinses prior to using the Flonase.  - Continue with Allegra every other day should be fine.   4. Gastroesophageal reflux disease - Continue with the Nexium in the morning. - Continue with famotidine twice daily.  5. Return in about 4 months (around 10/01/2023).    Please inform us of any Emergency Department visits, hospitalizations, or changes in symptoms. Call us before going to the ED for breathing or allergy symptoms since we might be able to fit you in for a sick visit. Feel free  to contact us anytime with any questions, problems, or concerns.  It was a pleasure to see you again today!  Websites that have reliable patient information: 1. American Academy of Asthma, Allergy, and Immunology: www.aaaai.org 2. Food Allergy Research and Education  (FARE): foodallergy.org 3. Mothers of Asthmatics: http://www.asthmacommunitynetwork.org 4. American College of Allergy, Asthma, and Immunology: www.acaai.org      "Like" [place] on Facebook and Instagram for our latest updates!      A healthy democracy works best when [employer] participate! Make sure you are registered to vote! If you have moved or changed any of your contact information, you will need to get this updated before voting! Scan the QR codes below to learn more!             Subjective:   Janet Garcia is a 77 y.o. female presenting today for follow up of  Chief Complaint  Patient presents with  . Asthma    No issues     Janet Garcia has a history of the following: Patient Active Problem List   Diagnosis Date Noted  . Closed wedge compression fracture of T7 vertebra with delayed healing 05/04/2021  . Closed wedge compression fracture of T9 vertebra with delayed healing 05/04/2021  . Age-related osteoporosis with current pathological fracture with delayed healing 02/02/2021  . History of vertebral compression fracture 06/29/2020  . 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 Diastolic dysfunction 06/30/2019  . Osteoarthritis of left hip 03/05/2018  . Aortic atherosclerosis (HCC) 01/19/2018  . B12 deficiency 05/10/2017  . Insomnia 03/25/2017  . Closed compression fracture of L3 vertebra (HCC) 08/06/2016  . Recurrent infections 02/24/2016  . 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/24/2016  . Severe persistent asthma 02/24/2016  . Chronic rhinitis 02/24/2016  . Anemia of chronic disease 02/24/2016  . Opacity of lung on imaging study 02/24/2016  . Cataract 10/11/2015  . Moderate persistent asthma 09/15/2015  . Esophageal reflux 09/15/2015  . Essential hypertension 09/15/2015  . Abnormal liver enzymes 07/26/2015  . 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 Lumbosacral spondylosis 07/26/2015  . Asthma, mild  persistent 07/05/2015  . Abnormal CAT scan 06/30/2015  . PNA (pneumonia) 06/21/2015  . Anxiety 05/23/2015  . HLD (hyperlipidemia) 03/24/2015  . 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 Family history of colonic polyps 01/13/2015  . Recurrent disease 11/21/2014  . Essential (primary) hypertension 08/15/2014  . Anxiety, generalized 08/15/2014  . Left lower lobe pneumonia 04/16/2014  . Arthritis, degenerative 04/16/2014  . Primary osteoarthritis involving multiple joints 04/16/2014  . Chronic infection of sinus 03/15/2014  . Bronchiectasis (HCC) 03/10/2014  . H/O respiratory system disease 03/10/2014  . Can't get food down 03/01/2014  . 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 Lumbar radiculopathy 02/28/2014  . Headache, migraine 02/28/2014  . Osteopenia 02/28/2014  . Airway hyperreactivity 02/24/2014  . Closed nondisplaced fracture of proximal phalanx of lesser toe of right foot 01/24/2014  . Allergic rhinitis 11/05/2013  . Degeneration of intervertebral disc of lumbosacral region 11/05/2013  . Esophagitis, reflux 11/05/2013  . Perennial allergic rhinitis with seasonal variation 11/05/2013    History obtained from: chart review and {Persons; PED relatives w/patient:19415::"patient"}.  Discussed the use of AI scribe software for clinical note transcription with the patient and/or guardian, who gave verbal consent to proceed.  Janet Garcia is a 77 y.o. female presenting for {Blank single:19197::"a food challenge","a drug challenge","skin testing","a sick visit","an evaluation of ***","a follow up visit"}.  She was last seen in July 2024  by Dr. Allena Katz.  At that time, she was doing fairly well on her infusions.  She was continued on 38 g of HyQvia every 3 weeks.  For her asthma, she had a normal spirometry.  We continued on Breo 200 mcg 1 puff once daily and albuterol as needed.  She has Pulmicort that she uses during flares.  For her rhinitis, we continue with Flonase as  well as Allegra.  Discussed the use of AI scribe software for clinical note transcription with the patient, who gave verbal consent to proceed.  History of Present Illness   The patient, with a history of pulmonary fibrosis, reports feeling well overall despite recent imaging showing continued progression of scarring. She notes that her oxygen levels remain stable and she is managing her symptoms with medication, although she expresses difficulty tolerating the full prescribed dose due to side effects of brain fog and excessive sleepiness. She has found a balance with a reduced dose that allows her to function normally.  The patient also has a history of pneumonia, with the most recent episode occurring a year ago. Since then, she has been managing her symptoms with Breo, which she reports as effective. She notes a noticeable increase in coughing if she misses a dose.  The patient is also on immunoglobulin therapy, which she self-administers every three weeks. She reports some initial difficulties with itching and redness, but has found that premedication with Benadryl manages these side effects effectively. She also reports no recent infections, suggesting the therapy is effective.  The patient also mentions a recent bereavement, with the passing of her mother a year ago. She reports managing well with this loss. She also mentions a hobby of working with wool, for which she takes precautions to minimize dust exposure.  In summary, the patient is managing her pulmonary fibrosis with medication and immunoglobulin therapy, and reports feeling well overall despite the progressive nature of her disease. She is proactive in managing her symptoms and side effects, and remains active in her daily life.        {Blank single:19197::"Asthma/Respiratory Symptom History: ***"," "}  IMPRESSION: 1. 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 Findings are suggestive of an alternative diagnosis (not UIP) per consensus guidelines: Diagnosis of Idiopathic Pulmonary Fibrosis: An Official ATS/ERS/JRS/ALAT Clinical Practice Guideline. Am Rosezetta Schlatter Crit Care Med Vol 198, Iss 5, [phone], Mar 22 2017. 2. 3 mm left lower lobe nodule, not well seen on 02/19/2022. Consider follow-up CT chest in 1 year, as clinically indicated. 3. Punctate left renal stone. 4.  Aortic atherosclerosis (ICD10-I70.0).  {Blank single:19197::"Allergic Rhinitis Symptom History: ***"," "}  {Blank single:19197::"Food Allergy Symptom History: ***"," "}  {Blank single:19197::"Skin Symptom History: ***"," "}  {Blank single:19197::"GERD Symptom History: ***"," "}  Otherwise, there have been no changes to her past medical history, surgical history, family history, or social history.    Review of systems otherwise negative other than that mentioned in the HPI.    Objective:   Blood pressure 124/70, pulse 88, temperature 98.4 F (36.9 C), resp. rate 16, height 5' (1.524 m), weight 130 lb 4.8 oz (59.1 kg), SpO2 96%. Body mass index is 25.45 kg/m.    Physical Exam   Diagnostic studies:    Spirometry: results normal (FEV1: 1.75/77%, FVC: 1.52/87%, FEV1/FVC: 87%).    Spirometry consistent with normal pattern.   Allergy Studies: none        [name], MD  [facility]

## 2023-06-04 ENCOUNTER — Other Ambulatory Visit: Payer: Self-pay

## 2023-06-04 LAB — IGG, IGA, IGM
IgA/Immunoglobulin A, Serum: 413 mg/dL (ref 64–422)
IgG (Immunoglobin G), Serum: 1585 mg/dL (ref 586–1602)
IgM (Immunoglobulin M), Srm: 52 mg/dL (ref 26–217)

## 2023-06-04 MED ORDER — PIRFENIDONE 267 MG PO TABS
534.0000 mg | ORAL_TABLET | Freq: Three times a day (TID) | ORAL | 5 refills | Status: DC
  Filled 2023-06-04: qty 180, 30d supply, fill #0
  Filled 2023-07-29 (×2): qty 180, 30d supply, fill #1
  Filled 2023-08-25: qty 180, 30d supply, fill #2
  Filled 2023-09-18: qty 180, 30d supply, fill #3
  Filled 2023-10-21: qty 180, 30d supply, fill #4
  Filled 2023-11-17: qty 180, 30d supply, fill #5

## 2023-06-04 NOTE — Telephone Encounter (Signed)
Returned call to patient regarding pirfenidone. Unable to reach. Left VM and requested return call if she still needed assistance  Chesley Mires, PharmD, MPH, BCPS, CPP Clinical Pharmacist (Rheumatology and Pulmonology)

## 2023-06-04 NOTE — Telephone Encounter (Signed)
Refill sent for PIRFENIDONE to [facility]: [phone]   Dose: 534mg  three times daily  Last OV: 05/15/2023 Provider: Dr. Isaiah Serge Pertinent labs: LFTs on 05/15/2023  Routing to scheduling team for follow-up on appt scheduling  Chesley Mires, PharmD, MPH, BCPS Clinical Pharmacist (Rheumatology and Pulmonology)

## 2023-06-04 NOTE — Progress Notes (Signed)
Refills approved 06/04/23. Shipping 11/14 for 11/15.

## 2023-06-05 ENCOUNTER — Other Ambulatory Visit: Payer: Self-pay

## 2023-06-05 ENCOUNTER — Encounter: Payer: Self-pay | Admitting: Allergy & Immunology

## 2023-06-05 MED ORDER — FAMOTIDINE 20 MG PO TABS: 20.0000 mg | ORAL_TABLET | Freq: Two times a day (BID) | ORAL | 1 refills | Status: AC

## 2023-06-05 MED ORDER — BREO ELLIPTA 200-25 MCG/ACT IN AEPB: 1.0000 | INHALATION_SPRAY | Freq: Every day | RESPIRATORY_TRACT | 5 refills | Status: DC

## 2023-06-05 MED ORDER — ESOMEPRAZOLE MAGNESIUM 20 MG PO CPDR: 20.0000 mg | DELAYED_RELEASE_CAPSULE | Freq: Every day | ORAL | 1 refills | Status: AC

## 2023-06-25 ENCOUNTER — Other Ambulatory Visit: Payer: Self-pay

## 2023-06-27 ENCOUNTER — Other Ambulatory Visit: Payer: Self-pay

## 2023-07-29 ENCOUNTER — Other Ambulatory Visit: Payer: Self-pay

## 2023-07-29 NOTE — Progress Notes (Signed)
 Specialty Pharmacy Refill Coordination Note  Janet Garcia is a 78 y.o. female contacted today regarding refills of specialty medication(s) Pirfenidone    Patient requested Delivery   Delivery date: 08/01/23   Verified address: [address]   Medication will be filled on 07/31/23.

## 2023-07-31 ENCOUNTER — Other Ambulatory Visit: Payer: Self-pay

## 2023-08-19 ENCOUNTER — Other Ambulatory Visit (HOSPITAL_COMMUNITY): Payer: Self-pay

## 2023-08-22 ENCOUNTER — Other Ambulatory Visit: Payer: Self-pay

## 2023-08-25 ENCOUNTER — Other Ambulatory Visit: Payer: Self-pay

## 2023-08-25 NOTE — Progress Notes (Signed)
Specialty Pharmacy Refill Coordination Note  Janet Garcia is a 78 y.o. female contacted today regarding refills of specialty medication(s) Pirfenidone   Patient requested Delivery   Delivery date: 08/28/23   Verified address: [address]   Medication will be filled on 02.05.25.

## 2023-08-27 ENCOUNTER — Other Ambulatory Visit: Payer: Self-pay

## 2023-09-18 ENCOUNTER — Other Ambulatory Visit: Payer: Self-pay

## 2023-09-18 ENCOUNTER — Other Ambulatory Visit: Payer: Self-pay | Admitting: Pharmacy Technician

## 2023-09-18 NOTE — Progress Notes (Signed)
 Specialty Pharmacy Refill Coordination Note  Janet Garcia is a 78 y.o. female contacted today regarding refills of specialty medication(s) Pirfenidone   Patient requested Delivery   Delivery date: 10/01/23   Verified address: [address] [place]   Medication will be filled on 09/30/23.

## 2023-09-20 HISTORY — PX: BACK SURGERY: SHX140

## 2023-09-25 ENCOUNTER — Ambulatory Visit: Payer: Medicare Other | Admitting: Allergy & Immunology

## 2023-09-30 ENCOUNTER — Other Ambulatory Visit: Payer: Self-pay

## 2023-10-02 ENCOUNTER — Ambulatory Visit: Payer: Medicare Other | Admitting: Allergy & Immunology

## 2023-10-21 ENCOUNTER — Other Ambulatory Visit: Payer: Self-pay | Admitting: Pharmacy Technician

## 2023-10-21 ENCOUNTER — Other Ambulatory Visit: Payer: Self-pay

## 2023-10-21 NOTE — Progress Notes (Signed)
 Specialty Pharmacy Refill Coordination Note  Janet Garcia is a 78 y.o. female contacted today regarding refills of specialty medication(s) No data recorded  Patient requested (Patient-Rptd) Delivery   Delivery date: (Patient-Rptd) 11/20/23 New delivery Date 10/30/23   Verified address: (Patient-Rptd) [address], [place] [address]   Medication will be filled on 10/29/23.   LVM to confirm with patient Delivery date.

## 2023-10-29 ENCOUNTER — Other Ambulatory Visit: Payer: Self-pay

## 2023-11-17 ENCOUNTER — Other Ambulatory Visit (HOSPITAL_COMMUNITY): Payer: Self-pay

## 2023-11-17 NOTE — Progress Notes (Signed)
 Specialty Pharmacy Refill Coordination Note  Janet Garcia is a 78 y.o. female contacted today regarding refills of specialty medication(s) Pirfenidone    Patient requested Delivery   Delivery date: 11/27/23   Verified address: [address] [place] [address]   Medication will be filled on 11/26/23.

## 2023-11-17 NOTE — Progress Notes (Signed)
 Specialty Pharmacy Ongoing Clinical Assessment Note  Janet Garcia is a 78 y.o. female who is being followed by the specialty pharmacy service for RxSp Interstitial Lung Disease   Patient's specialty medication(s) reviewed today: Pirfenidone    Missed doses in the last 4 weeks: 10 (patient doesn't take when she doesn't feel like eating, missed 10-12 doses this month.  Takes 3 times a day.)   Patient/Caregiver did not have any additional questions or concerns.   Therapeutic benefit summary: Patient is achieving benefit   Adverse events/side effects summary: Experienced adverse events/side effects (nausea and fatigue; patient does not take if she hasn't eatten, missed around 10-12 doses this month because she didn't feel like eating, her provider is aware)   Patient's therapy is appropriate to: Continue    Goals Addressed             This Visit's Progress    Stabilization of disease   Worsening    Patient is not on track and worsening. Patient will maintain adherence. Per office visit notes from Dr. Waylan Haggard on 05/15/23, there was slight progression seen on the last CT scan.          Follow up:  6 months  Malachi Screws Specialty Pharmacist

## 2023-11-26 ENCOUNTER — Other Ambulatory Visit: Payer: Self-pay

## 2023-11-27 ENCOUNTER — Ambulatory Visit (INDEPENDENT_AMBULATORY_CARE_PROVIDER_SITE_OTHER): Payer: Medicare Other | Admitting: Allergy & Immunology

## 2023-11-27 VITALS — BP 110/66 | HR 84 | Temp 98.4°F | Resp 14 | Ht 59.45 in | Wt 129.2 lb

## 2023-11-27 DIAGNOSIS — J310 Chronic rhinitis: Secondary | ICD-10-CM | POA: Diagnosis not present

## 2023-11-27 DIAGNOSIS — R5383 Other fatigue: Secondary | ICD-10-CM

## 2023-11-27 DIAGNOSIS — E538 Deficiency of other specified B group vitamins: Secondary | ICD-10-CM

## 2023-11-27 DIAGNOSIS — J479 Bronchiectasis, uncomplicated: Secondary | ICD-10-CM

## 2023-11-27 DIAGNOSIS — D839 Common variable immunodeficiency, unspecified: Secondary | ICD-10-CM

## 2023-11-27 DIAGNOSIS — J4550 Severe persistent asthma, uncomplicated: Secondary | ICD-10-CM

## 2023-11-27 DIAGNOSIS — D638 Anemia in other chronic diseases classified elsewhere: Secondary | ICD-10-CM

## 2023-11-27 MED ORDER — NEBULIZER AIR TUBE/PLUGS MISC: 1.0000 | 2 refills | Status: AC | PRN

## 2023-11-27 NOTE — Progress Notes (Signed)
 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 Annamaria Barrette, now followed by Dr. Waylan Haggard with transition to more of a UIP picture and being treated with pirfenidone     Chronic rhinitis   Gastroesophageal reflux disease - on a PPI and H2 blocker   Chronic back pain - s/p kyphoplasty October 2022 complicated by osteoporosis   Fatigue - previously improved, but worsening as of late   Paternal history unknown (father might have died from emphysema, but largely unknown)  Plan/Recommendations:   1. Common variable immunodeficiency - We will continue with the current dosing of Hyqvia  (38 grams every 3 weeks).  - Continue with the premedication regimen.   2. Severe persistent asthma, uncomplicated - Lung testing looks very stable today. - It certainly is not consistent with the chest CT findings, but I am reassured that it looks normal.  - Daily controller medication(s): Breo 200/25 mcg one puff once daily - Prior to physical activity: ProAir  2 puffs 10-15 minutes before physical activity. - Rescue medications: ProAir  4 puffs every 4-6 hours as needed or albuterol  nebulizer one vial every 4-6 hours as needed - Changes during respiratory infections or worsening symptoms: Add on Arnuity 200mcg to one inhalation twice daily for TWO WEEKS. - You can also add on albuterol /Pulmicort  neb BEFORE the Acapella device to open things up for a few weeks during flares.  - Asthma control goals:  * Full participation in all desired activities (may need albuterol  before activity) * Albuterol  use two time or less a week on average (not counting use with activity) * Cough interfering with  sleep two time or less a month * Oral steroids no more than once a year * No hospitalizations  3. Chronic rhinitis - Continue with Flonase  one spray per nostril daily. - Consider using nasal saline rinses prior to using the Flonase .  - Continue with Allegra  every other day should be fine.   4. Gastroesophageal reflux disease - Continue with the Nexium  in the morning. - Continue with famotidine  twice daily.  5. Return in about 6 months (around 05/29/2024).   Subjective:   Janet Garcia is a 78 y.o. female presenting today for follow up of  Chief Complaint  Patient presents with   Asthma    Says she is doing okay. Asthma seems to be the same.    Allergic Rhinitis     Says she is well.     Janet Garcia has a history of the following: Patient Active Problem List   Diagnosis Date Noted   Closed wedge compression fracture of T7 vertebra with delayed healing 05/04/2021   Closed wedge compression fracture of T9 vertebra with delayed healing 05/04/2021   Age-related osteoporosis with current pathological fracture with delayed healing 02/02/2021   History of vertebral compression fracture 06/29/2020   Symptomatic PVCs 07/29/2019   Diastolic dysfunction 06/30/2019   Osteoarthritis of left hip 03/05/2018   Aortic atherosclerosis (HCC) 01/19/2018   B12 deficiency 05/10/2017   Insomnia 03/25/2017   Closed compression fracture of L3 vertebra (HCC) 08/06/2016   Recurrent infections 02/24/2016   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/24/2016   Severe persistent asthma 02/24/2016   Chronic rhinitis 02/24/2016  Anemia of chronic disease 02/24/2016   Opacity of lung on imaging study 02/24/2016   Cataract 10/11/2015   Moderate persistent asthma 09/15/2015   Esophageal reflux 09/15/2015   Essential hypertension 09/15/2015   Abnormal liver enzymes 07/26/2015   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   H/O total hip arthroplasty 07/26/2015   Lumbosacral spondylosis  07/26/2015   Asthma, mild persistent 07/05/2015   Abnormal CAT scan 06/30/2015   PNA (pneumonia) 06/21/2015   Anxiety 05/23/2015   HLD (hyperlipidemia) 03/24/2015   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   Family history of colonic polyps 01/13/2015   Recurrent disease 11/21/2014   Essential (primary) hypertension 08/15/2014   Anxiety, generalized 08/15/2014   Left lower lobe pneumonia 04/16/2014   Arthritis, degenerative 04/16/2014   Primary osteoarthritis involving multiple joints 04/16/2014   Chronic infection of sinus 03/15/2014   Bronchiectasis (HCC) 03/10/2014   H/O respiratory system disease 03/10/2014   Can't get food down 03/01/2014   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  Discussed the use of AI scribe software for clinical note transcription with the patient and/or guardian, who gave verbal consent to proceed.  Janet Garcia is a 78 y.o. female presenting for a follow up visit. We last saw her in November 2024. At that time, we continued with the current dosing of Hyqvia  every 3 weeks. For his asthma, we continued with Breo 200 mcg one puff once daily. She continued on albuterol  as needed. For her rhinitis, we continued with the fluticasone  one spray per nostril daily as well as Allegra  daily.We continued with the GERD treatment with Nexium  and famotidine .   Since the last visit, she has done relatively well.   She has a history of back surgery and is currently undergoing physical therapy. She  experienced numbness in her leg, and steroid injections were attempted twice without success. Due to her bone condition, which fluctuates between osteopenia and osteoporosis, certain surgical interventions were not possible. She is currently on Prolia, having previously been on Evenity for a year, and her recent DEXA scan showed improvement in her arm and back but not in her hip. She continues to receive Prolia.  She had a hemilaminectomy on March, 2025.  This was followed by several weeks of physical therapy and she has been slowly increasing her activity.  Her last bone density was stable.  Asthma/Respiratory Symptom History: She has been experiencing excellent spirometry results, with readings of 90%, 91%, and 87%, and feels that her lung function has improved significantly. She practices breathing exercises, such as breathing in for four counts, holding for eight, and breathing out for twelve, which she believes has helped her condition.  She started learning this technique from one of her friends.  This is a apparently an ancient [place] technique and she has felt that it has helped quite a bit.   She manages her pulmonary fibrosis with pirfenidone , taking two pills twice a day instead of the prescribed three pills three times a day due to side effects. She experiences nausea if she does not eat well before taking the medication and describes a general  feeling of being 'unmoved'. To counteract this, she takes vitamin B12, which she finds helps with her energy levels. No recent respiratory infections. She mentions feeling 'depressed' due to low energy levels, which she attributes to her medication.   She has an appointment for full pulmonary function testing on May 15.  This is followed by an appointment with Dr. Irving Mantle on May 23.  Allergic Rhinitis Symptom History: She remains on her Flonase  as well as her Allegra .  She also uses nasal saline rinses.  GERD Symptom History: GERD is under good control  with Nexium .  She also is on famotidine  twice a day.  She rarely has any flares.  Infection Symptom History: Her infusions are going well.  She gets HyQvia  every 3 weeks.  This current dose seems to have stabilized her.  She has not received any antibiotics in probably 18 months.  She had a fairly severe case of pneumonia in December 2023 and took 2 to 3 months to improve. She has not contracted COVID-19 despite exposure during a bus trip, attributing her immunity to her immunoglobulin treatment. She recalls a previous pneumonia episode in December 2023, which took a long time to recover from.  Her husband, who is 1 years old, recently fell but was able to get up on his own. He has some cognitive decline that 'comes and goes'.  Her daughter is pregnant after getting donor eggs.  She is [redacted] weeks pregnant with twins.  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 She is working on [employer]" now and shows me several pictures of her latest rug that she is making.  Otherwise, there have been no changes to her past medical history, surgical history, family history, or social history.    Review of systems otherwise negative other than that mentioned in the HPI.    Objective:   Blood pressure 110/66, pulse 84, temperature 98.4 F (36.9 C), temperature source Temporal, resp. rate 14, height 4' 11.45" (1.51 m), weight 129 lb 3.2 oz (58.6 kg), SpO2 97%. Body mass index is 25.7 kg/m.    Physical Exam Vitals reviewed.  Constitutional:      Appearance: She is well-developed.     Comments: Delightful as always. Cooperative with the exam. Hoarseness seems much less severe today.  HENT:     Head: Normocephalic and atraumatic.     Right Ear: Tympanic membrane, ear canal and external ear normal.     Left Ear: Tympanic membrane, ear canal and external ear normal.     Nose: No nasal deformity, septal deviation, mucosal edema or rhinorrhea.     Right Turbinates: Enlarged, swollen and pale.     Left Turbinates:  Enlarged, swollen and pale.     Right Sinus: No maxillary sinus tenderness or frontal sinus tenderness.     Left Sinus: No maxillary sinus tenderness or frontal sinus tenderness.     Comments: No nasal polyps noted.     Mouth/Throat:     Lips: Pink.     Mouth: Mucous membranes are moist. Mucous membranes are not pale and not dry.     Pharynx: Oropharynx is clear. Uvula midline.     Tonsils: No tonsillar exudate. 1+ on the right. 1+ on the left.     Comments: Cobblestoning present in the posterior oropharynx.  Eyes:     General: Allergic shiner present.        Right eye: No discharge.        Left eye: No discharge.  Conjunctiva/sclera: Conjunctivae normal.     Right eye: Right conjunctiva is not injected. No chemosis.    Left eye: Left conjunctiva is not injected. No chemosis.    Pupils: Pupils are equal, round, and reactive to light.  Cardiovascular:     Rate and Rhythm: Normal rate and regular rhythm.     Heart sounds: Normal heart sounds.  Pulmonary:     Effort: Pulmonary effort is normal. No tachypnea, accessory muscle usage or respiratory distress.     Breath sounds: Examination of the right-lower field reveals decreased breath sounds. Examination of the left-lower field reveals decreased breath sounds. Decreased breath sounds present. No wheezing, rhonchi or rales.     Comments: Moving air well in all lung fields. She does have her baseline hoarseness. There are no crackles today.  Moving air at the bases.  Does not appear tachypneic. Chest:     Chest wall: No tenderness.  Lymphadenopathy:     Cervical: No cervical adenopathy.  Skin:    Coloration: Skin is not pale.     Findings: No abrasion, erythema, petechiae or rash. Rash is not papular, urticarial or vesicular.  Neurological:     Mental Status: She is alert.  Psychiatric:        Behavior: Behavior is cooperative.      Diagnostic studies:    Spirometry: results normal (FEV1: 1.52/90%, FVC: 1.89/87%, FEV1/FVC:  80%).    Spirometry consistent with normal pattern.  This is probably her personal best that I have seen.  Allergy Studies: none        Drexel Gentles, MD  [facility] 

## 2023-11-27 NOTE — Patient Instructions (Addendum)
 1. Common variable immunodeficiency - We will continue with the current dosing of Hyqvia  (38 grams every 3 weeks).  - Continue with the premedication regimen.   2. Severe persistent asthma, uncomplicated - Lung testing looks very stable today. - It certainly is not consistent with the chest CT findings, but I am reassured that it looks normal.  - Daily controller medication(s): Breo 200/25 mcg one puff once daily - Prior to physical activity: ProAir  2 puffs 10-15 minutes before physical activity. - Rescue medications: ProAir  4 puffs every 4-6 hours as needed or albuterol  nebulizer one vial every 4-6 hours as needed - Changes during respiratory infections or worsening symptoms: Add on Arnuity 200mcg to one inhalation twice daily for TWO WEEKS. - You can also add on albuterol /Pulmicort  neb BEFORE the Acapella device to open things up for a few weeks during flares.  - Asthma control goals:  * Full participation in all desired activities (may need albuterol  before activity) * Albuterol  use two time or less a week on average (not counting use with activity) * Cough interfering with sleep two time or less a month * Oral steroids no more than once a year * No hospitalizations  3. Chronic rhinitis - Continue with Flonase  one spray per nostril daily. - Consider using nasal saline rinses prior to using the Flonase .  - Continue with Allegra  every other day should be fine.   4. Gastroesophageal reflux disease - Continue with the Nexium  in the morning. - Continue with famotidine  twice daily.  5. Return in about 6 months (around 05/29/2024).    Please inform us  of any Emergency Department visits, hospitalizations, or changes in symptoms. Call us  before going to the ED for breathing or allergy symptoms since we might be able to fit you in for a sick visit. Feel free to contact us  anytime with any questions, problems, or concerns.  It was a pleasure to see you again today!  Websites that have  reliable patient information: 1. American Academy of Asthma, Allergy, and Immunology: www.aaaai.org 2. Food Allergy Research and Education (FARE): foodallergy.org 3. Mothers of Asthmatics: http://www.asthmacommunitynetwork.org 4. American College of Allergy, Asthma, and Immunology: www.acaai.org      "Like" us  on Facebook and Instagram for our latest updates!      A healthy democracy works best when [employer] participate! Make sure you are registered to vote! If you have moved or changed any of your contact information, you will need to get this updated before voting! Scan the QR codes below to learn more!

## 2023-11-28 ENCOUNTER — Encounter: Payer: Self-pay | Admitting: Allergy & Immunology

## 2023-12-02 ENCOUNTER — Ambulatory Visit: Payer: Self-pay | Admitting: Allergy & Immunology

## 2023-12-02 LAB — CMP14+EGFR
ALT: 17 IU/L (ref 0–32)
AST: 28 IU/L (ref 0–40)
Albumin: 4.3 g/dL (ref 3.8–4.8)
Alkaline Phosphatase: 94 IU/L (ref 44–121)
BUN/Creatinine Ratio: 17 (ref 12–28)
BUN: 16 mg/dL (ref 8–27)
Bilirubin Total: <0.2 mg/dL (ref 0.0–1.2)
CO2: 23 mmol/L (ref 20–29)
Calcium: 9.2 mg/dL (ref 8.7–10.3)
Chloride: 104 mmol/L (ref 96–106)
Creatinine, Ser: 0.94 mg/dL (ref 0.57–1.00)
Globulin, Total: 2.9 g/dL (ref 1.5–4.5)
Glucose: 90 mg/dL (ref 70–99)
Potassium: 4.3 mmol/L (ref 3.5–5.2)
Sodium: 141 mmol/L (ref 134–144)
Total Protein: 7.2 g/dL (ref 6.0–8.5)
eGFR: 62 mL/min/{1.73_m2} (ref >59)

## 2023-12-02 LAB — CBC WITH DIFFERENTIAL/PLATELET
Basophils Absolute: 0 10*3/uL (ref 0.0–0.2)
Basos: 1 %
EOS (ABSOLUTE): 0.1 10*3/uL (ref 0.0–0.4)
Eos: 1 %
Hematocrit: 36.4 % (ref 34.0–46.6)
Hemoglobin: 12.1 g/dL (ref 11.1–15.9)
Immature Grans (Abs): 0 10*3/uL (ref 0.0–0.1)
Immature Granulocytes: 0 %
Lymphocytes Absolute: 1.1 10*3/uL (ref 0.7–3.1)
Lymphs: 22 %
MCH: 33.9 pg — ABNORMAL HIGH (ref 26.6–33.0)
MCHC: 33.2 g/dL (ref 31.5–35.7)
MCV: 102 fL — ABNORMAL HIGH (ref 79–97)
Monocytes Absolute: 0.4 10*3/uL (ref 0.1–0.9)
Monocytes: 7 %
Neutrophils Absolute: 3.4 10*3/uL (ref 1.4–7.0)
Neutrophils: 69 %
Platelets: 278 10*3/uL (ref 150–450)
RBC: 3.57 x10E6/uL — ABNORMAL LOW (ref 3.77–5.28)
RDW: 11.9 % (ref 11.7–15.4)
WBC: 4.9 10*3/uL (ref 3.4–10.8)

## 2023-12-02 LAB — IGG: IgG (Immunoglobin G), Serum: 1390 mg/dL (ref 586–1602)

## 2023-12-02 LAB — VITAMIN B6: Vitamin B6: 56.4 ug/L (ref 3.4–65.2)

## 2023-12-02 LAB — VITAMIN B12: Vitamin B-12: 518 pg/mL (ref 232–1245)

## 2023-12-02 LAB — VITAMIN B1: Thiamine: 204 nmol/L — ABNORMAL HIGH (ref 66.5–200.0)

## 2023-12-04 ENCOUNTER — Ambulatory Visit: Admitting: Pulmonary Disease

## 2023-12-04 DIAGNOSIS — J849 Interstitial pulmonary disease, unspecified: Secondary | ICD-10-CM | POA: Diagnosis not present

## 2023-12-04 LAB — PULMONARY FUNCTION TEST
DL/VA % pred: 81 %
DL/VA: 3.44 ml/min/mmHg/L
DLCO cor % pred: 59 %
DLCO cor: 9.76 ml/min/mmHg
DLCO unc % pred: 56 %
DLCO unc: 9.34 ml/min/mmHg
FEF 25-75 Pre: 1.49 L/s
FEF2575-%Pred-Pre: 111 %
FEV1-%Pred-Pre: 93 %
FEV1-Pre: 1.57 L
FEV1FVC-%Pred-Pre: 106 %
FEV6-%Pred-Pre: 89 %
FEV6-Pre: 1.91 L
FEV6FVC-%Pred-Pre: 105 %
FVC-%Pred-Pre: 87 %
FVC-Pre: 1.97 L
Pre FEV1/FVC ratio: 80 %
Pre FEV6/FVC Ratio: 100 %
RV % pred: 68 %
RV: 1.46 L
TLC % pred: 79 %
TLC: 3.54 L

## 2023-12-04 NOTE — Patient Instructions (Signed)
 Spiro, DLCO and lung volumes performed today

## 2023-12-04 NOTE — Progress Notes (Signed)
 Spiro, DLCO and lung volumes performed today

## 2023-12-12 ENCOUNTER — Ambulatory Visit (INDEPENDENT_AMBULATORY_CARE_PROVIDER_SITE_OTHER): Admitting: Pulmonary Disease

## 2023-12-12 ENCOUNTER — Encounter: Payer: Self-pay | Admitting: Pulmonary Disease

## 2023-12-12 VITALS — BP 146/68 | HR 86 | Ht 60.0 in | Wt 128.2 lb

## 2023-12-12 DIAGNOSIS — J849 Interstitial pulmonary disease, unspecified: Secondary | ICD-10-CM

## 2023-12-12 DIAGNOSIS — Z7189 Other specified counseling: Secondary | ICD-10-CM

## 2023-12-12 DIAGNOSIS — R0600 Dyspnea, unspecified: Secondary | ICD-10-CM | POA: Diagnosis not present

## 2023-12-12 DIAGNOSIS — Z5181 Encounter for therapeutic drug level monitoring: Secondary | ICD-10-CM

## 2023-12-12 DIAGNOSIS — J45909 Unspecified asthma, uncomplicated: Secondary | ICD-10-CM | POA: Diagnosis not present

## 2023-12-12 DIAGNOSIS — K219 Gastro-esophageal reflux disease without esophagitis: Secondary | ICD-10-CM | POA: Diagnosis not present

## 2023-12-12 NOTE — Progress Notes (Addendum)
 Janet Garcia    [id number]    May 26, 1946  Primary Care Physician:Jobe, Toribio NOVAK., MD  Referring Physician: Knox Toribio NOVAK., MD No address on file  Chief complaint: Follow-up for Interstitial lung disease, asthma Started pirfenidone  in February 2024  HPI: 78 y.o.  with history of severe persistent asthma, common variable immunodeficiency, GERD She follows with Dr. Iva and is on immunoglobulin subcutaneous injections.  She has history of bronchiectasis previously followed by Dr. Nathanael.  She has been referred for evaluation of bronchiectasis Complains of occasional productive cough, dyspnea with wheezing.  She has indigestion from GERD.  Saw Dr. Dolphus in early January 2024 for elevated SSA.  It was felt that she did not have any autoimmune process She is over the income cutoff for assistance with Ofev She started pirfenidone  in early February 2024.  She went up on the dose to 2 pills 3 times a day and notes brain fog which is limiting her quality of life.   Pets: Cats Occupation: A Administrator, sports Exposures: No mold, hot tub, Jacuzzi.  No feather pillows or comforters Smoking history: Never smoker Travel history: Previously lived in [place]  Relevant family history: No family history of lung disease  Interim history: Discussed the use of AI scribe software for clinical note transcription with the patient, who gave verbal consent to proceed.  History of Present Illness Troi Bechtold is a 78 year old female with interstitial lung disease and pulmonary fibrosis who presents for follow-up. She was referred by Dr. Iva for evaluation of her interstitial lung disease.  Her breathing is stable with no complaints regarding her respiratory status. She is currently taking pirfenidone , two tablets three times a day, for her interstitial lung disease. She experiences nausea and dizziness approximately 40-45 minutes after taking the  medication, but these symptoms subside over time. A previous attempt at a higher dose of three tablets three times a day was intolerable due to increased side effects.  Recent lung function tests were stable, with a mild to moderate defect in oxygen exchange. She recalls a previous test where her blowout was at 91%.  For asthma management, she is on Breo, Singulair, and uses an albuterol  inhaler as needed. Singulair is taken at night, and she has used her rescue inhaler more frequently this spring due to an upper respiratory illness affecting both her and her husband. This illness was confirmed not to be the flu or COVID-19.  She had back surgery in March 2025, which involved the use of a ventilator. She has a history of osteopenia, complicating her back issues. During her recovery from surgery, she paused her pirfenidone  due to lack of appetite and resumed it after about a week.  She is also on medication for acid reflux, which is well-controlled.    Outpatient Encounter Medications as of 12/12/2023  Medication Sig   albuterol  (PROAIR  HFA) 108 (90 Base) MCG/ACT inhaler Inhale 2 puffs into the lungs every 4 (four) hours as needed.   aspirin 81 MG EC tablet Take 81 mg by mouth daily.   atorvastatin (LIPITOR) 20 MG tablet Take 20 mg by mouth.   BREO ELLIPTA  200-25 MCG/ACT AEPB Inhale 1 puff into the lungs daily.   budesonide  (PULMICORT ) 0.5 MG/2ML nebulizer solution Use one vial mixed with an albuterol  nebulizer vial every 6 hours during periods of respiratory distress for 1-2 weeks at a time.   Cholecalciferol (VITAMIN D3) 50 MCG (2000 UT) capsule  Take 1 capsule by mouth daily.   EPIPEN  2-PAK 0.3 MG/0.3ML SOAJ injection Inject 0.3 mLs (0.3 mg total) into the muscle as needed for anaphylaxis.   esomeprazole  (NEXIUM ) 20 MG capsule Take 1 capsule (20 mg total) by mouth daily.   famotidine  (PEPCID ) 20 MG tablet Take 1 tablet (20 mg total) by mouth 2 (two) times daily.   fexofenadine  (ALLEGRA ) 180 MG  tablet Take 1 tablet (180 mg total) by mouth daily as needed for rhinitis.   fluticasone  (FLONASE ) 50 MCG/ACT nasal spray Place 1 spray into both nostrils daily.   gabapentin (NEURONTIN) 100 MG capsule Take 100 mg by mouth 3 (three) times daily.   Immune Globulin-Hyaluronidase (HYQVIA ) 30 GM/300ML KIT Inject 38 g into the skin every 21 ( twenty-one) days.   losartan (COZAAR) 25 MG tablet Take 50 mg by mouth daily.   metoprolol succinate (TOPROL-XL) 25 MG 24 hr tablet Take 25 mg by mouth daily.   montelukast (SINGULAIR) 10 MG tablet    Multiple Vitamin (MULTIVITAMIN) tablet Take by mouth.   Pirfenidone  267 MG TABS Take 2 tablets (534 mg total) by mouth 3 (three) times daily with meals.   Respiratory Therapy Supplies (NEBULIZER AIR TUBE/PLUGS) MISC 1 kit by Other route as needed. Please provide replacement tubing for nebulizer machine.   No facility-administered encounter medications on file as of 12/12/2023.    Physical Exam: Blood pressure (!) 146/68, pulse 86, height 5' (1.524 m), weight 128 lb 3.2 oz (58.2 kg), SpO2 98%. Gen:      No acute distress HEENT:  EOMI, sclera anicteric Neck:     No masses; no thyromegaly Lungs:    Clear to auscultation bilaterally; normal respiratory effort CV:         Regular rate and rhythm; no murmurs Abd:      + bowel sounds; soft, non-tender; no palpable masses, no distension Ext:    No edema; adequate peripheral perfusion Neuro: alert and oriented x 3 Psych: normal mood and affect   Data Reviewed: Imaging: CT high-resolution 06/25/2015.  Report from [facility] Mild to moderate cylindrical bronchiectasis in lingula and bilateral lower lobes.  Patchy consolidation and groundglass in lingula and bilateral lower lobes. Fine groundglass centrilobular nodularity in the upper lungs  CT chest 02/26/2016 Mild subpleural reticular densities and bronchiectasis at the base.  Aortic and coronary atherosclerosis  High-resolution CT 08/25/2021 Reduce of groundglass  attenuation and septal thickening and bronchovascular interstitial lung with cylindrical bronchiectasis.  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  High-resolution CT 02/19/2022.  Redemonstration of groundglass attenuation, septal thickening and bronchiectasis  High resolution CT 05/28/2023-stable pattern of interstitial lung disease I have reviewed the images personally.  PFTs: Spirometry 10/26/2020 FVC 1.75 [75%], FEV1 1.57 [91%], F/F 19 No obstruction, restriction possible  10/08/2021 FVC 1.68 [72%], FEV1 1.18 [68%], F/F 70, TLC 2.92 [65%], DLCO 9.47 [57%] Moderate restriction and diffusion defect  12/04/2023 FVC 1.97 [87%), FEV1 1.57 [92%], F/F80, TLC 3.54 [79%], DLCO 9.34 [56%] Moderate diffusion defect.  Stable  Labs: CTD serologies 09/28/2021-ANA 1:40 nucleolar homogeneous, SSA 28  Assessment & Plan IPF She has history of bronchiectasis was presumed secondary to chronic immunodeficiency Further review of CT scan shows some fibrotic changes.  She did have mild reticulation at the base with progression since 2017.  This raises the question of CTD ILD or UIP pulmonary fibrosis  Noted to have elevation in ANA and SSA. She does have dry mouth and dry ice raising the possibility of Sjogren's syndrome.  Rheumatology evaluation is  pending  We reviewed her case at multidisciplinary conference on June 2023.  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  Over the income cutoff for Ofev assistance.  On Pirfenidone  2 tablets TID, tolerating with difficulty. She is on low-dose as she cannot tolerate the higher dose due to brain fog.  Even if she gets some of the medication I believe it would be beneficial to slow the progression.   -Continue Pirfenidone  as tolerated. -Recent labs last month showed normal liver function -Order echocardiogram to evaluate pulmonary hypertension -Schedule follow-up CT in 6 months  Asthma Asthma is  well-controlled with Breo, Singulair, and albuterol  inhaler as needed. A recent upper respiratory infection necessitated increased rescue inhaler use, indicating a mild exacerbation of symptoms. - Continue Breo, Singulair, and albuterol  inhaler as needed  Gastroesophageal reflux disease (GERD) GERD is well-managed with current medication regimen. No recent exacerbations. - Continue current GERD medications.  On Nexium  and famotidine     Plan/Recommendations: Continue pirfenidone   Check echocardiogram PFTs and follow-up in 6 months  Lonna Coder MD [facility] 12/12/2023, 10:54 AM  CC: Knox Toribio NOVAK., MD

## 2023-12-12 NOTE — Patient Instructions (Signed)
 VISIT SUMMARY:  Today, we reviewed your interstitial lung disease, asthma, GERD, and osteopenia. Your breathing is stable, and your lung function tests are consistent. We discussed your current medications and any side effects you are experiencing. We also planned for future monitoring of your lung condition and overall health.  YOUR PLAN:  -INTERSTITIAL LUNG DISEASE WITH PULMONARY FIBROSIS: Interstitial lung disease with pulmonary fibrosis is a condition where the lung tissue becomes scarred and stiff, making it difficult to breathe. Your condition is well-managed with pirfenidone , although you experience mild nausea and dizziness after taking it. We will continue your current dose of pirfenidone  (two tablets three times a day) and monitor your lung condition with an echocardiogram and a CT scan in six months.  -ASTHMA: Asthma is a condition where your airways narrow and swell, making it hard to breathe. Your asthma is well-controlled with Breo, Singulair, and an albuterol  inhaler as needed. You had a mild increase in symptoms due to a recent upper respiratory infection, but your current medications are effective.  -GASTROESOPHAGEAL REFLUX DISEASE (GERD): GERD is a condition where stomach acid frequently flows back into the tube connecting your mouth and stomach, causing irritation. Your GERD is well-managed with your current medications, and there have been no recent flare-ups.  INSTRUCTIONS:  Please continue taking your medications as prescribed. We have ordered an echocardiogram to monitor for potential pulmonary hypertension and a CT scan in six months to check your lung condition. Follow up with us  after these tests are completed.

## 2023-12-19 ENCOUNTER — Other Ambulatory Visit: Payer: Self-pay

## 2023-12-22 ENCOUNTER — Other Ambulatory Visit: Payer: Self-pay

## 2023-12-22 ENCOUNTER — Other Ambulatory Visit: Payer: Self-pay | Admitting: Pulmonary Disease

## 2023-12-22 DIAGNOSIS — J849 Interstitial pulmonary disease, unspecified: Secondary | ICD-10-CM

## 2023-12-22 MED ORDER — PIRFENIDONE 267 MG PO TABS
534.0000 mg | ORAL_TABLET | Freq: Three times a day (TID) | ORAL | 5 refills | Status: DC
  Filled 2023-12-22 – 2023-12-24 (×2): qty 180, 30d supply, fill #0

## 2023-12-22 NOTE — Telephone Encounter (Signed)
 Refill sent for PIRFENIDONE  to [facility]: [phone]   Dose: 534mg  three times daily  Last OV: 12/12/23 Provider: Dr. Waylan Haggard Pertinent labs: LFTs on 11/27/2023 wnl  Next OV: in Nov 2025 (not yet scheduled)  Geraldene Kleine, PharmD, MPH, BCPS Clinical Pharmacist (Rheumatology and Pulmonology)

## 2023-12-24 ENCOUNTER — Other Ambulatory Visit: Payer: Self-pay

## 2024-01-15 ENCOUNTER — Ambulatory Visit (HOSPITAL_BASED_OUTPATIENT_CLINIC_OR_DEPARTMENT_OTHER)

## 2024-01-15 DIAGNOSIS — J849 Interstitial pulmonary disease, unspecified: Secondary | ICD-10-CM

## 2024-01-15 DIAGNOSIS — R0689 Other abnormalities of breathing: Secondary | ICD-10-CM

## 2024-01-15 DIAGNOSIS — R0600 Dyspnea, unspecified: Secondary | ICD-10-CM

## 2024-01-15 LAB — ECHOCARDIOGRAM COMPLETE
Area-P 1/2: 5.62 cm2
Est EF: >75
S' Lateral: 1.41 cm

## 2024-01-29 ENCOUNTER — Ambulatory Visit: Payer: Self-pay | Admitting: Pulmonary Disease

## 2024-03-01 ENCOUNTER — Other Ambulatory Visit: Payer: Self-pay

## 2024-03-01 MED ORDER — BREO ELLIPTA 200-25 MCG/ACT IN AEPB: 1.0000 | INHALATION_SPRAY | Freq: Every day | RESPIRATORY_TRACT | 5 refills | Status: AC

## 2024-03-16 ENCOUNTER — Telehealth: Payer: Self-pay

## 2024-03-16 ENCOUNTER — Other Ambulatory Visit (HOSPITAL_COMMUNITY): Payer: Self-pay

## 2024-03-16 ENCOUNTER — Other Ambulatory Visit: Payer: Self-pay

## 2024-03-16 DIAGNOSIS — J849 Interstitial pulmonary disease, unspecified: Secondary | ICD-10-CM

## 2024-03-16 DIAGNOSIS — Z5181 Encounter for therapeutic drug level monitoring: Secondary | ICD-10-CM

## 2024-03-16 MED ORDER — OFEV 100 MG PO CAPS: 100.0000 mg | ORAL_CAPSULE | Freq: Two times a day (BID) | ORAL | 0 refills | Status: DC

## 2024-03-16 NOTE — Telephone Encounter (Addendum)
 We can switch her to Ofev . She already tolerates pirfenidone  very poorly and has to adjust dosing. States she is unable to drive or move around much without taking into consideration when her pirfenidone  dose was last taken. D/w patient. She would like to try Ofev  100mg  twice daily (LOW DOSE) and see how she does with this  LFTs on 03/08/2024 in CareEverywhere reviewed. WNL  Patient counseled on purpose, proper use, and potential adverse effects including diarrhea, nausea, vomiting, abdominal pain, decreased appetite, weight loss, and increased blood pressure. Stressed the importance of routine lab monitoring. Will monitor LFT's every month for the first 6 months of treatment then every 3 months. Will monitor CBC every 3 months.  Stressed importance of taking with food to minimize stomach upset.  Future order for hepatic function panel placed. Pt aware to call office to schedule lab appt at 1 month mark.  Provided her with pharmacy phone number to schedule shipment.  Phone: [facility]: [phone]   Sherry Pennant, PharmD, MPH, BCPS, CPP Clinical Pharmacist (Rheumatology and Pulmonology)

## 2024-03-16 NOTE — Telephone Encounter (Signed)
 Agree with trying ofev  instead of pirfenidone  if she qualifies for patient assistance as that was a bad reaction in the past.

## 2024-03-16 NOTE — Telephone Encounter (Signed)
 Spoke with pt this morning regarding being overdue to pirfenidone  refill. Pt states that she hadn't heard anything about it but had actually thought about it last night and planned to look into it today. Pt has historically experienced side effects with pirfenidone  and is thus taking a modified dose (one tablet with meals and 2 at bedtime) in order to avoid the side effects. She also states that she does not always eat 3 meals a day and therefore does not strictly stick to her dosing schedule.  She mentioned that she wished she could go back and try the Ofev  to see if it would treat her any better, however she was unable to previously enroll into their PAP program as her income exceeded their threshold. Looking back at pt's fill history, she has been routinely filled her pirfenidone  WITH a copay. Out of curiosity I ran a test claim for Ofev , which resulted in a $0 copay. Advised pt that I would investigate the possibility of her switching therapies with Dr. Theophilus and the clinic pharmacists and would get back to her. Pt verbalized understanding to all.

## 2024-03-18 ENCOUNTER — Encounter: Payer: Self-pay | Admitting: Neurology

## 2024-04-20 ENCOUNTER — Other Ambulatory Visit

## 2024-04-20 DIAGNOSIS — J849 Interstitial pulmonary disease, unspecified: Secondary | ICD-10-CM | POA: Diagnosis not present

## 2024-04-20 DIAGNOSIS — Z5181 Encounter for therapeutic drug level monitoring: Secondary | ICD-10-CM | POA: Diagnosis not present

## 2024-04-20 LAB — HEPATIC FUNCTION PANEL
ALT: 30 U/L (ref 0–35)
AST: 34 U/L (ref 0–37)
Albumin: 3.8 g/dL (ref 3.5–5.2)
Alkaline Phosphatase: 81 U/L (ref 39–117)
Bilirubin, Direct: 0.1 mg/dL (ref 0.0–0.3)
Total Bilirubin: 0.4 mg/dL (ref 0.2–1.2)
Total Protein: 6.9 g/dL (ref 6.0–8.3)

## 2024-04-27 ENCOUNTER — Encounter: Payer: Self-pay | Admitting: Allergy & Immunology

## 2024-05-01 ENCOUNTER — Ambulatory Visit: Payer: Self-pay | Admitting: Pulmonary Disease

## 2024-06-10 ENCOUNTER — Other Ambulatory Visit: Payer: Self-pay | Admitting: Pulmonary Disease

## 2024-06-10 DIAGNOSIS — J849 Interstitial pulmonary disease, unspecified: Secondary | ICD-10-CM

## 2024-06-10 NOTE — Telephone Encounter (Signed)
 Pt requesting refill of specialty medication - routig to Rx team to advise.

## 2024-06-10 NOTE — Progress Notes (Signed)
 Initial neurology clinic note  Reason for Evaluation: Consultation requested by Cleatus Ryan Dess, MD for an opinion regarding spasticity of fingers. My final recommendations will be communicated back to the requesting physician by way of shared medical record or letter to requesting physician via [place]  mail.  HPI: This is Ms. Janet Garcia, a 78 y.o. right-handed female with a medical history of asthma, ILD, compression fractures of thoracic vertebrae (T7 and T9), HTN, HLD, DDD of lumbar spine, migraine, OA, anxiety who presents to neurology clinic with the chief complaint of spasticity of fingers. The patient is alone today.  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 She would have to straighten out her fingers. It was uncomfortable and a little bit painful. She was seeing Dr. Cleatus (hand specialist) at [facility] who was going to do a surgery, but surgery was not done and per patient Dr. Cleatus told her he was concerned it might be from something like a stroke. His referral notes mention spasticity, possible need for EDX and botox of FDS.  The flexion of her fingers stopped during the summer. She now having abduction of the index finger from the middle finger. It feels like a cramp in her hand but not her forearm. It is pretty constant. It does not occur when she rests her hand on her leg.  She endorses pins and needles sensation that comes and goes on the dorsum of her hand randomly. She denies weakness. She denies any symptoms in other extremities.  She mentions chronic neck and back pain. She also mentions crushing her left elbow about 40 years ago with no known nerve damage.  Patient takes gabapentin 100 mg twice daily for back pain. She uses Mama Bear cream on her hand and it helps a little.  She report any constitutional symptoms like fever, night sweats, anorexia or unintentional  weight loss.  EtOH use: rare  Restrictive diet? no Family history of neurologic disease? No   MEDICATIONS:  Outpatient Encounter Medications as of 06/24/2024  Medication Sig   albuterol  (PROAIR  HFA) 108 (90 Base) MCG/ACT inhaler Inhale 2 puffs into the lungs every 4 (four) hours as needed.   aspirin 81 MG EC tablet Take 81 mg by mouth daily.   atorvastatin (LIPITOR) 20 MG tablet Take 20 mg by mouth.   BREO ELLIPTA  200-25 MCG/ACT AEPB Inhale 1 puff into the lungs daily.   budesonide  (PULMICORT ) 0.5 MG/2ML nebulizer solution Use one vial mixed with an albuterol  nebulizer vial every 6 hours during periods of respiratory distress for 1-2 weeks at a time.   Cholecalciferol (VITAMIN D3) 50 MCG (2000 UT) capsule Take 1 capsule by mouth daily.   EPIPEN  2-PAK 0.3 MG/0.3ML SOAJ injection Inject 0.3 mLs (0.3 mg total) into the muscle as needed for anaphylaxis.   esomeprazole  (NEXIUM ) 20 MG capsule Take 1 capsule (20 mg total) by mouth daily.   famotidine  (PEPCID ) 20 MG tablet Take 1 tablet (20 mg total) by mouth 2 (two) times daily.   fexofenadine  (ALLEGRA ) 180 MG tablet Take 1 tablet (180 mg total) by mouth daily as needed for rhinitis.   fluticasone  (FLONASE ) 50 MCG/ACT nasal spray Place 1 spray into both nostrils daily.   gabapentin (NEURONTIN) 100 MG capsule Take 100 mg by mouth 3 (three) times daily. (Patient taking differently: Take 100 mg by mouth in the morning and at bedtime.)   Immune  Globulin-Hyaluronidase (HYQVIA ) 30 GM/300ML KIT Inject 38 g into the skin every 21 ( twenty-one) days.   losartan (COZAAR) 25 MG tablet Take 50 mg by mouth daily.   metoprolol succinate (TOPROL-XL) 25 MG 24 hr tablet Take 25 mg by mouth daily.   montelukast (SINGULAIR) 10 MG tablet    Multiple Vitamin (MULTIVITAMIN) tablet Take by mouth.   OFEV  100 MG CAPS TAKE 1 CAPSULE BY MOUTH TWICE  DAILY   Respiratory Therapy Supplies (NEBULIZER AIR TUBE/PLUGS) MISC 1 kit by Other route as needed. Please provide  replacement tubing for nebulizer machine.   No facility-administered encounter medications on file as of 06/24/2024.    PAST MEDICAL HISTORY: Past Medical History:  Diagnosis Date   Asthma    Bronchiectasis (HCC)    Immune deficiency disorder    Pneumonia     PAST SURGICAL HISTORY: Past Surgical History:  Procedure Laterality Date   BACK SURGERY  09/2023   KYPHOPLASTY  04/2021   TOTAL HIP ARTHROPLASTY Right     ALLERGIES: Allergies  Allergen Reactions   Fish Allergy Anaphylaxis   Codeine Nausea Only    Can tolerate if she eats prior to taking medication.    Elemental Sulfur Nausea Only   Erythromycin Nausea Only    Patient reports she CAN TAKE Zithromax  without problem   Guaifenesin Other (See Comments)    DIZZINESS   Levofloxacin Other (See Comments)    Double vision   Sulfa Antibiotics Nausea Only    FAMILY HISTORY: Family History  Problem Relation Age of Onset   Hypertension Mother    Diverticulitis Mother    Healthy Daughter    Macular degeneration Daughter    Allergic rhinitis Neg Hx    Angioedema Neg Hx    Asthma Neg Hx    Eczema Neg Hx    Immunodeficiency Neg Hx    Urticaria Neg Hx     SOCIAL HISTORY: Social History   Tobacco Use   Smoking status: Never    Passive exposure: Past   Smokeless tobacco: Never  Vaping Use   Vaping status: Never Used  Substance Use Topics   Alcohol use: Yes    Comment: 1 glass of wine a week   Drug use: No   Social History   Social History Narrative   Are you right handed or left handed? right   Are you currently employed ?    What is your current occupation? retired   Do you live at home alone?   Who lives with you? husband   What type of home do you live in: 1 story or 2 story? one    Caffiene 3 sodas mini a day     OBJECTIVE: PHYSICAL EXAM: BP (!) 152/82   Pulse 89   Ht 5' (1.524 m)   Wt 123 lb (55.8 kg)   SpO2 97%   BMI 24.02 kg/m   General: General appearance: Awake and alert. No  distress. Cooperative with exam.  Skin: No obvious rash or jaundice. HEENT: Atraumatic. Anicteric. Lungs: Non-labored breathing on room air  Heart: Regular Extremities: No edema. Psych: Affect appropriate.  Neurological: Mental Status: Alert. Speech fluent. No pseudobulbar affect Cranial Nerves: CNII: No RAPD. Visual fields grossly intact. CNIII, IV, VI: PERRL. No nystagmus. EOMI. CN V: Facial sensation intact bilaterally to fine touch. CN VII: Facial muscles symmetric and strong. No ptosis at rest. CN VIII: Hearing grossly intact bilaterally. CN IX: No hypophonia. CN X: Palate elevates symmetrically. CN XI: Full strength shoulder  shrug bilaterally. CN XII: Tongue protrusion full and midline. No atrophy or fasciculations. No significant dysarthria Motor: Tone is normal. Occasional abnormal posturing of left hand (index finger abducting) and wrist (ulnar deviation). No clear spasticity. No obvious bradykinesia with finger tapping. Slow release of tight grip. No percussion myotonia.  Individual muscle group testing (MRC grade out of 5):  Movement     Neck flexion 5    Neck extension 5     Right Left   Shoulder abduction 5 5   Elbow flexion 5 5   Elbow extension 5 5   Wrist extension 5 5   Wrist flexion 5 5   Finger abduction - FDI 5 5   Finger abduction - ADM 5 5   Finger extension 5 5   Finger distal flexion - 2/3 5 5    Finger distal flexion - 4/5 5 5    Thumb flexion - FPL 5 5   Thumb abduction - APB 5 5    Hip flexion 5 5   Hip extension 5 5   Hip adduction 5 5   Hip abduction 5 5   Knee extension 5 5   Knee flexion 5 5   Dorsiflexion 5 5   Plantarflexion 5 5    Reflexes:  Right Left   Bicep 2+ 2+   Tricep 2+ 2+   BrRad 2+ 2+   Knee 2+ 2+   Ankle Trace Trace    Pathological Reflexes: Hoffman: Absent bilaterally Troemner: Absent bilaterally Sensation: Pinprick: Intact in all extremities including the left hand Coordination: Intact finger-to-  nose-finger bilaterally. Romberg negative. Gait: Able to rise from chair with arms crossed unassisted. Narrow based, stooped posture. No freezing, shuffling. Normal turns.  Lab and Test Review: Internal labs: Hepatic function panel (04/20/24): unremarkable  11/27/23: CBC w/ diff significant for MCV 102 CMP unremarkable B6 wnl B1 elevated to 204.0 B12: 518  07/04/22: ANA positive at 1:40 CK 76 dsDNA negative Anti-Sm negative RNP ab negative  09/25/21: ANCA negative Scl-70 negative SSA/SSB negative  ASSESSMENT: Janet Garcia is a 78 y.o. female who presents for evaluation of abnormal contractions of left hand and wrist. She has a relevant medical history of asthma, ILD, compression fractures of thoracic vertebrae (T7 and T9), HTN, HLD, DDD of lumbar spine, migraine, OA, anxiety. Her neurological examination is pertinent for intermittent left index finger abduction and ulnar deviation of the wrist. It is not always present, particularly with distraction of other tasks. There is no clear spasticity of the left arm or hand. The etiology of patient's symptoms is unclear. Dystonia is possible. There are no clear neurologic deficits other than this abnormal posturing. I will work up presumed dystonia with MRI brain and cervical spine and trial Sinemet to monitor for response as below.  PLAN: -MRI brain and cervical wo contrast -Will trial Sinemet for possible dystonia as follows: Take 1/2 tablet three times daily, at least 30 minutes before meals (approximately 7am/11am/4pm), for one week Then take 1/2 tablet in the morning, 1/2 tablet in the afternoon, 1 tablet in the evening, at least 30 minutes before meals, for one week Then take 1/2 tablet in the morning, 1 tablet in the afternoon, 1 tablet in the evening, at least 30 minutes before meals, for one week Then take 1 tablet three times daily at 7am/11am/4pm, at least 30 minutes before meals  -Could consider EMG if above is not revealing  or helping symptoms -Could also consider botox  -Return to clinic 2-3 months  The impression above as well as the plan as outlined below were extensively discussed with the patient who voiced understanding. All questions were answered to their satisfaction.  When available, results of the above investigations and possible further recommendations will be communicated to the patient via telephone/MyChart. Patient to call office if not contacted after expected testing turnaround time.   Total time spent reviewing records, interview, history/exam, documentation, and coordination of care on day of encounter:  75 min   Thank you for allowing me to participate in patient's care.  If I can answer any additional questions, I would be pleased to do so.  Venetia Potters, MD   CC: Abran Jon CROME, MD [address] [place] [address] [place] [address]  CC: Referring provider: Cleatus Ryan Dess, MD No address on file

## 2024-06-10 NOTE — Telephone Encounter (Signed)
 Refill sent for OFEV  to [facility]: [phone]   Dose: 100mg  by mouth twice daily   Last OV: 12/12/23 Provider: Dr. Theophilus Pertinent labs: LFTs wnl 04/20/24  Next OV: 06/23/24  Aleck Puls, PharmD, BCPS Clinical Pharmacist  [facility]

## 2024-06-11 ENCOUNTER — Ambulatory Visit
Admission: RE | Admit: 2024-06-11 | Discharge: 2024-06-11 | Disposition: A | Discharge status: Home / Self Care | Source: Ambulatory Visit | Attending: Pulmonary Disease | Admitting: Pulmonary Disease

## 2024-06-11 DIAGNOSIS — J849 Interstitial pulmonary disease, unspecified: Secondary | ICD-10-CM

## 2024-06-11 DIAGNOSIS — R0600 Dyspnea, unspecified: Secondary | ICD-10-CM

## 2024-06-23 ENCOUNTER — Encounter: Payer: Self-pay | Admitting: Pulmonary Disease

## 2024-06-23 ENCOUNTER — Ambulatory Visit: Admitting: Pulmonary Disease

## 2024-06-23 VITALS — BP 149/84 | HR 81 | Temp 97.9°F | Ht 60.0 in | Wt 124.0 lb

## 2024-06-23 DIAGNOSIS — Z5181 Encounter for therapeutic drug level monitoring: Secondary | ICD-10-CM | POA: Diagnosis not present

## 2024-06-23 DIAGNOSIS — J849 Interstitial pulmonary disease, unspecified: Secondary | ICD-10-CM

## 2024-06-23 LAB — HEPATIC FUNCTION PANEL
ALT: 18 U/L (ref 0–35)
AST: 28 U/L (ref 0–37)
Albumin: 4 g/dL (ref 3.5–5.2)
Alkaline Phosphatase: 70 U/L (ref 39–117)
Bilirubin, Direct: 0.1 mg/dL (ref 0.0–0.3)
Total Bilirubin: 0.4 mg/dL (ref 0.2–1.2)
Total Protein: 7.2 g/dL (ref 6.0–8.3)

## 2024-06-23 NOTE — Progress Notes (Signed)
 Janet Garcia    [id number]    12-29-1945  Primary Care Physician:Perry, Jon CROME, MD  Referring Physician: Abran Jon CROME, MD [address] [place],  [place] [address]  Chief complaint: Follow-up for Interstitial lung disease, asthma Started pirfenidone  in February 2024 but was poorly tolerated Started nintedanib September 2025  HPI: 78 y.o.  with history of severe persistent asthma, common variable immunodeficiency, GERD, pulmonary fibrosis She follows with Dr. Iva and is on immunoglobulin subcutaneous injections.  She has history of bronchiectasis previously followed by Dr. Nathanael.  She has been referred for evaluation of bronchiectasis Complains of occasional productive cough, dyspnea with wheezing.  She has indigestion from GERD.  Saw Dr. Dolphus in early January 2024 for elevated SSA.  It was felt that she did not have any autoimmune process She is over the income cutoff for assistance with Ofev  She started pirfenidone  in early February 2024.  She went up on the dose to 2 pills 3 times a day and notes brain fog which is limiting her quality of life.  Switch to nintedanib at a lower dose of 100 mg twice daily in September 2025  Interim history: Discussed the use of AI scribe software for clinical note transcription with the patient, who gave verbal consent to proceed.  History of Present Illness  Janet Garcia is a 78 year old female with pulmonary fibrosis and common variable immunodeficiency who presents for follow-up of her respiratory condition.  Dyspnea and pulmonary fibrosis - Pulmonary fibrosis with improved breathing since switching to nintedanib (Ofev ) 100 mg in September 2025 - Mild post-dose nausea lasting 15 to 30 minutes, improved by eating a peanut butter sandwich in the morning - Occasional diarrhea managed with Imodium, considered tolerable - No recent pneumonia  Asthma control - Asthma controlled on Breo and Singulair - No  reported adverse effects from asthma medications  Osteoporosis management - Osteoporosis previously treated with Evenity, currently on Prolia - Reported improvement in bone health   Relevant pulmonary history Pets: Cats Occupation: A board member of nonprofit organization Exposures: No mold, hot tub, Jacuzzi.  No feather pillows or comforters Smoking history: Never smoker Travel history: Previously lived in [place]  Relevant family history: No family history of lung disease  Outpatient Encounter Medications as of 06/23/2024  Medication Sig   albuterol  (PROAIR  HFA) 108 (90 Base) MCG/ACT inhaler Inhale 2 puffs into the lungs every 4 (four) hours as needed.   aspirin 81 MG EC tablet Take 81 mg by mouth daily.   atorvastatin (LIPITOR) 20 MG tablet Take 20 mg by mouth.   BREO ELLIPTA  200-25 MCG/ACT AEPB Inhale 1 puff into the lungs daily.   budesonide  (PULMICORT ) 0.5 MG/2ML nebulizer solution Use one vial mixed with an albuterol  nebulizer vial every 6 hours during periods of respiratory distress for 1-2 weeks at a time.   Cholecalciferol (VITAMIN D3) 50 MCG (2000 UT) capsule Take 1 capsule by mouth daily.   EPIPEN  2-PAK 0.3 MG/0.3ML SOAJ injection Inject 0.3 mLs (0.3 mg total) into the muscle as needed for anaphylaxis.   esomeprazole  (NEXIUM ) 20 MG capsule Take 1 capsule (20 mg total) by mouth daily.   famotidine  (PEPCID ) 20 MG tablet Take 1 tablet (20 mg total) by mouth 2 (two) times daily.   fexofenadine  (ALLEGRA ) 180 MG tablet Take 1 tablet (180 mg total) by mouth daily as needed for rhinitis.   fluticasone  (FLONASE ) 50 MCG/ACT nasal spray Place 1 spray into both  nostrils daily.   gabapentin (NEURONTIN) 100 MG capsule Take 100 mg by mouth 3 (three) times daily.   Immune Globulin-Hyaluronidase (HYQVIA ) 30 GM/300ML KIT Inject 38 g into the skin every 21 ( twenty-one) days.   losartan (COZAAR) 25 MG tablet Take 50 mg by mouth daily.   metoprolol succinate (TOPROL-XL) 25 MG 24 hr tablet  Take 25 mg by mouth daily.   montelukast (SINGULAIR) 10 MG tablet    Multiple Vitamin (MULTIVITAMIN) tablet Take by mouth.   OFEV  100 MG CAPS TAKE 1 CAPSULE BY MOUTH TWICE  DAILY   Respiratory Therapy Supplies (NEBULIZER AIR TUBE/PLUGS) MISC 1 kit by Other route as needed. Please provide replacement tubing for nebulizer machine.   No facility-administered encounter medications on file as of 06/23/2024.    Vitals:   06/23/24 1418 06/23/24 1446  BP: (!) 151/80 (!) 149/84  Pulse: 81   Temp: 97.9 F (36.6 C)   SpO2: 98%    Physical Exam GEN: No acute distress CV: Regular rate and rhythm no murmurs LUNGS: Crackles present, otherwise clear to auscultation bilaterally with normal respiratory effort SKIN JOINTS: Warm and dry no rash   Data Reviewed: Imaging: CT high-resolution 06/25/2015.  Report from [facility] Mild to moderate cylindrical bronchiectasis in lingula and bilateral lower lobes.  Patchy consolidation and groundglass in lingula and bilateral lower lobes. Fine groundglass centrilobular nodularity in the upper lungs  CT chest 02/26/2016 Mild subpleural reticular densities and bronchiectasis at the base.  Aortic and coronary atherosclerosis  High-resolution CT 08/25/2021 Reduce of groundglass attenuation and septal thickening and bronchovascular interstitial lung with cylindrical bronchiectasis.  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  High-resolution CT 02/19/2022.  Redemonstration of groundglass attenuation, septal thickening and bronchiectasis  High resolution CT 05/28/2023-stable pattern of interstitial lung disease  High-resolution CT 06/11/2024-pulmonary pattern of fibrosis stable compared to before.  Resolution of previously seen ill-defined peribronchovascular ground glass. I have reviewed the images personally.  PFTs: Spirometry 10/26/2020 FVC 1.75 [75%], FEV1 1.57 [91%], F/F 19 No obstruction, restriction possible  10/08/2021 FVC 1.68 [72%], FEV1 1.18 [68%], F/F 70, TLC 2.92  [65%], DLCO 9.47 [57%] Moderate restriction and diffusion defect  12/04/2023 FVC 1.97 [87%), FEV1 1.57 [92%], F/F80, TLC 3.54 [79%], DLCO 9.34 [56%] Moderate diffusion defect.  Stable  Labs: CTD serologies 09/28/2021-ANA 1:40 nucleolar homogeneous, SSA 28  Cardiac: Echocardiogram 01/15/2024 LVEF greater than 75%.  Normal RV systolic function.  Trivial tricuspid regurgitation inadequate for assessing PA pressure.  TAPSE 1.4 cm  Assessment & Plan IPF She has history of bronchiectasis was presumed secondary to chronic immunodeficiency Further review of CT scan shows some fibrotic changes.  She did have mild reticulation at the base with progression since 2017.  This raises the question of CTD ILD or UIP pulmonary fibrosis  Noted to have elevation in ANA and SSA. She does have dry mouth and dry ice raising the possibility of Sjogren's syndrome.  Rheumatology evaluation is pending  We reviewed her case at multidisciplinary conference on June 2023.  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  She did not tolerate pirfenidone  and is now well-managed with nintedanib (Ofev ) at 100 mg. She experiences mild nausea post-medication, resolving in 15-30 minutes, and occasional diarrhea managed with Imodium. CT scan shows improved inflammation from 2023-2024, with stable scarring. No recent pneumonia episodes, likely due to Hyqvia  treatment for common variable immunodeficiency.  No evidence of pulmonary hypertension on echocardiogram 2025 - Continue nintedanib (Ofev ) 100  mg daily. - Ordered hepatic panel to monitor liver function. - Scheduled follow-up in six months.  Asthma Asthma is well-controlled with Breo, Singulair, and albuterol  inhaler as needed. A recent upper respiratory infection necessitated increased rescue inhaler use, indicating a mild exacerbation of symptoms. - Continue Breo, Singulair, and albuterol  inhaler as  needed  Gastroesophageal reflux disease (GERD) GERD is well-managed with current medication regimen. No recent exacerbations. - Continue current GERD medications.  On Nexium  and famotidine   Bronchiectasis, CVID Managed with Hyqvia  for common variable immunodeficiency, contributing to the absence of recent pneumonia episodes. - Continue Hyqvia  as prescribed by Dr. Iva  I personally spent a total of 36 minutes in the care of the patient today including preparing to see the patient, getting/reviewing separately obtained history, referring and communicating with other health care professionals, documenting clinical information in the EHR, independently interpreting results, and communicating results.   Plan/Recommendations: Continue nintedanib Check hepatic panel  Lonna Coder MD [facility] 06/23/2024, 2:05 PM  CC: Abran Jon CROME, MD

## 2024-06-23 NOTE — Patient Instructions (Addendum)
  VISIT SUMMARY: Today, we reviewed your respiratory condition, including pulmonary fibrosis, asthma, and bronchiectasis, as well as your immunodeficiency and osteoporosis management. Overall, your conditions are well-managed with your current medications.  YOUR PLAN: PULMONARY FIBROSIS: Your pulmonary fibrosis is well-managed with nintedanib (Ofev ) 100 mg. You experience mild nausea and occasional diarrhea, which are manageable. -Continue taking nintedanib (Ofev ) 100 mg daily. -Eat a peanut butter sandwich in the morning to help with nausea. -Use Imodium as needed for diarrhea. -A hepatic panel has been ordered to monitor your liver function. -Schedule a follow-up appointment in six months.  ASTHMA: Your asthma is well-controlled with your current medications. -Continue taking Breo and Singulair as prescribed.  BRONCHIECTASIS: Your bronchiectasis is managed with Hyqvia , which has helped prevent recent pneumonia episodes. -Continue taking Hyqvia  as prescribed.

## 2024-06-24 ENCOUNTER — Ambulatory Visit: Admitting: Neurology

## 2024-06-24 ENCOUNTER — Encounter: Payer: Self-pay | Admitting: Neurology

## 2024-06-24 VITALS — BP 152/82 | HR 89 | Ht 60.0 in | Wt 123.0 lb

## 2024-06-24 DIAGNOSIS — R202 Paresthesia of skin: Secondary | ICD-10-CM

## 2024-06-24 DIAGNOSIS — G249 Dystonia, unspecified: Secondary | ICD-10-CM

## 2024-06-24 DIAGNOSIS — R200 Anesthesia of skin: Secondary | ICD-10-CM

## 2024-06-24 MED ORDER — CARBIDOPA-LEVODOPA 25-100 MG PO TABS: ORAL_TABLET | ORAL | 5 refills | Status: AC

## 2024-06-24 NOTE — Patient Instructions (Addendum)
 I saw you today for odd muscle spasm like symptoms in your left hand. 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 I would like to get an MRI of your brain and neck to evaluate further.  We also agreed to try a medication that can sometimes help with dystonia symptoms: Start Carbidopa Levodopa as follows: Take 1/2 tablet three times daily, at least 30 minutes before meals (approximately 7am/11am/4pm), for one week Then take 1/2 tablet in the morning, 1/2 tablet in the afternoon, 1 tablet in the evening, at least 30 minutes before meals, for one week Then take 1/2 tablet in the morning, 1 tablet in the afternoon, 1 tablet in the evening, at least 30 minutes before meals, for one week Then take 1 tablet three times daily at 7am/11am/4pm, at least 30 minutes before meals   As a reminder, carbidopa/levodopa can be taken at the same time as a carbohydrate, but we like to have you take your pill either 30 minutes before a protein source or 1 hour after as protein can interfere with carbidopa/levodopa absorption.  Common side effects (eg, nausea, somnolence, headache) are less likely with low starting doses and slow titration and tend to resolve over time.  Let me know how this medication is working in about 1 month or if you have any problems with the medication.  I will be in touch when I have the results of your MRIs.  I will see you back in clinic in about 2-3 months.  Please let me know if you have any questions or concerns in the meantime.  The physicians and staff at [facility] are committed to providing excellent care. You may receive a survey requesting feedback about your experience at our office. We strive to receive very good responses to the survey questions. If you feel that your experience would prevent you from giving the office a very good  response, please contact our office to try to remedy the situation. We may be reached at  [phone]. Thank you for taking the time out of your busy day to complete the survey.  Venetia Potters, MD [facility]

## 2024-06-29 ENCOUNTER — Ambulatory Visit: Payer: Self-pay | Admitting: Pulmonary Disease

## 2024-07-09 ENCOUNTER — Inpatient Hospital Stay: Admission: RE | Admit: 2024-07-09 | Discharge: 2024-07-09 | Attending: Neurology

## 2024-07-09 DIAGNOSIS — R200 Anesthesia of skin: Secondary | ICD-10-CM

## 2024-07-09 DIAGNOSIS — G249 Dystonia, unspecified: Secondary | ICD-10-CM

## 2024-08-07 ENCOUNTER — Ambulatory Visit
Admission: RE | Admit: 2024-08-07 | Discharge: 2024-08-07 | Disposition: A | Source: Ambulatory Visit | Attending: Neurology

## 2024-08-10 ENCOUNTER — Encounter: Payer: Self-pay | Admitting: Neurology

## 2024-08-10 ENCOUNTER — Telehealth: Payer: Self-pay | Admitting: Neurology

## 2024-08-10 NOTE — Telephone Encounter (Signed)
 Attempted to call patient about MRI cervical spine results and see how her trial of Sinemet  was going. I left a message on VM asking for a call back to our office.  Venetia Potters, MD [facility]

## 2024-08-12 ENCOUNTER — Ambulatory Visit: Payer: Self-pay | Admitting: Neurology

## 2024-09-17 ENCOUNTER — Ambulatory Visit: Admitting: Neurology

## 2024-11-30 ENCOUNTER — Ambulatory Visit: Admitting: Allergy & Immunology
